# Patient Record
Sex: Female | Born: 1935 | Race: Black or African American | Hispanic: No | Smoking: Never smoker
Health system: Southern US, Community
[De-identification: ages and names within clinical notes are randomized; demographics above are authoritative.]

## PROBLEM LIST (undated history)

## (undated) ENCOUNTER — Emergency Department (HOSPITAL_COMMUNITY): Disposition: A | Discharge status: Home / Self Care | Payer: Medicare PPO

## (undated) DIAGNOSIS — I4892 Unspecified atrial flutter: Secondary | ICD-10-CM

## (undated) DIAGNOSIS — M1990 Unspecified osteoarthritis, unspecified site: Secondary | ICD-10-CM

## (undated) DIAGNOSIS — T8859XA Other complications of anesthesia, initial encounter: Secondary | ICD-10-CM

## (undated) DIAGNOSIS — T4145XA Adverse effect of unspecified anesthetic, initial encounter: Secondary | ICD-10-CM

## (undated) DIAGNOSIS — N189 Chronic kidney disease, unspecified: Secondary | ICD-10-CM

## (undated) DIAGNOSIS — Z5189 Encounter for other specified aftercare: Secondary | ICD-10-CM

## (undated) DIAGNOSIS — I10 Essential (primary) hypertension: Secondary | ICD-10-CM

## (undated) DIAGNOSIS — I2510 Atherosclerotic heart disease of native coronary artery without angina pectoris: Secondary | ICD-10-CM

## (undated) DIAGNOSIS — M069 Rheumatoid arthritis, unspecified: Secondary | ICD-10-CM

## (undated) DIAGNOSIS — K922 Gastrointestinal hemorrhage, unspecified: Secondary | ICD-10-CM

## (undated) DIAGNOSIS — K5790 Diverticulosis of intestine, part unspecified, without perforation or abscess without bleeding: Secondary | ICD-10-CM

## (undated) DIAGNOSIS — I209 Angina pectoris, unspecified: Secondary | ICD-10-CM

## (undated) DIAGNOSIS — R0602 Shortness of breath: Secondary | ICD-10-CM

## (undated) DIAGNOSIS — R413 Other amnesia: Secondary | ICD-10-CM

## (undated) DIAGNOSIS — N200 Calculus of kidney: Secondary | ICD-10-CM

## (undated) DIAGNOSIS — D649 Anemia, unspecified: Secondary | ICD-10-CM

## (undated) DIAGNOSIS — Z9862 Peripheral vascular angioplasty status: Secondary | ICD-10-CM

## (undated) DIAGNOSIS — G4730 Sleep apnea, unspecified: Secondary | ICD-10-CM

## (undated) HISTORY — PX: KNEE ARTHROPLASTY: SHX992

## (undated) HISTORY — PX: ABDOMINAL HYSTERECTOMY: SHX81

## (undated) HISTORY — PX: CORONARY ANGIOPLASTY: SHX604

## (undated) HISTORY — PX: EYE SURGERY: SHX253

## (undated) HISTORY — DX: Unspecified atrial flutter: I48.92

## (undated) HISTORY — PX: HIP ARTHROPLASTY: SHX981

---

## 1998-08-28 ENCOUNTER — Ambulatory Visit (HOSPITAL_COMMUNITY): Admission: RE | Admit: 1998-08-28 | Discharge: 1998-08-28 | Payer: Self-pay | Admitting: Internal Medicine

## 2000-02-20 ENCOUNTER — Encounter: Admission: RE | Admit: 2000-02-20 | Discharge: 2000-05-20 | Payer: Self-pay | Admitting: Orthopedic Surgery

## 2000-04-20 ENCOUNTER — Encounter: Payer: Self-pay | Admitting: Internal Medicine

## 2000-04-20 ENCOUNTER — Encounter: Admission: RE | Admit: 2000-04-20 | Discharge: 2000-04-20 | Payer: Self-pay | Admitting: Internal Medicine

## 2000-07-05 ENCOUNTER — Ambulatory Visit (HOSPITAL_COMMUNITY): Admission: RE | Admit: 2000-07-05 | Discharge: 2000-07-05 | Payer: Self-pay | Admitting: Family Medicine

## 2000-07-05 ENCOUNTER — Encounter: Payer: Self-pay | Admitting: Family Medicine

## 2001-06-14 ENCOUNTER — Encounter: Admission: RE | Admit: 2001-06-14 | Discharge: 2001-06-14 | Payer: Self-pay | Admitting: Family Medicine

## 2001-06-14 ENCOUNTER — Encounter: Payer: Self-pay | Admitting: Family Medicine

## 2001-11-28 ENCOUNTER — Encounter: Payer: Self-pay | Admitting: Orthopedic Surgery

## 2001-12-04 ENCOUNTER — Inpatient Hospital Stay (HOSPITAL_COMMUNITY): Admission: RE | Admit: 2001-12-04 | Discharge: 2001-12-07 | Payer: Self-pay | Admitting: Orthopedic Surgery

## 2001-12-04 ENCOUNTER — Encounter: Payer: Self-pay | Admitting: Orthopedic Surgery

## 2001-12-07 ENCOUNTER — Inpatient Hospital Stay (HOSPITAL_COMMUNITY)
Admission: RE | Admit: 2001-12-07 | Discharge: 2001-12-12 | Payer: Self-pay | Admitting: Physical Medicine & Rehabilitation

## 2002-03-04 ENCOUNTER — Encounter: Admission: RE | Admit: 2002-03-04 | Discharge: 2002-03-04 | Payer: Self-pay | Admitting: Family Medicine

## 2002-03-04 ENCOUNTER — Encounter: Payer: Self-pay | Admitting: Family Medicine

## 2002-08-04 ENCOUNTER — Encounter: Payer: Self-pay | Admitting: Orthopedic Surgery

## 2002-08-04 ENCOUNTER — Encounter: Admission: RE | Admit: 2002-08-04 | Discharge: 2002-08-04 | Payer: Self-pay | Admitting: Orthopedic Surgery

## 2002-11-03 ENCOUNTER — Encounter: Payer: Self-pay | Admitting: Orthopedic Surgery

## 2002-11-05 ENCOUNTER — Inpatient Hospital Stay (HOSPITAL_COMMUNITY): Admission: RE | Admit: 2002-11-05 | Discharge: 2002-11-09 | Payer: Self-pay | Admitting: Cardiology

## 2002-11-05 ENCOUNTER — Encounter: Payer: Self-pay | Admitting: Orthopedic Surgery

## 2002-11-13 ENCOUNTER — Inpatient Hospital Stay (HOSPITAL_COMMUNITY): Admission: AD | Admit: 2002-11-13 | Discharge: 2002-11-18 | Payer: Self-pay | Admitting: Internal Medicine

## 2002-11-14 ENCOUNTER — Encounter (INDEPENDENT_AMBULATORY_CARE_PROVIDER_SITE_OTHER): Payer: Self-pay | Admitting: Cardiology

## 2002-12-23 ENCOUNTER — Encounter: Admission: RE | Admit: 2002-12-23 | Discharge: 2003-03-23 | Payer: Self-pay | Admitting: Internal Medicine

## 2003-06-16 ENCOUNTER — Encounter: Payer: Self-pay | Admitting: Internal Medicine

## 2003-06-16 ENCOUNTER — Encounter: Admission: RE | Admit: 2003-06-16 | Discharge: 2003-06-16 | Payer: Self-pay | Admitting: Internal Medicine

## 2003-10-16 ENCOUNTER — Encounter: Payer: Self-pay | Admitting: Cardiology

## 2003-10-16 ENCOUNTER — Ambulatory Visit (HOSPITAL_COMMUNITY): Admission: RE | Admit: 2003-10-16 | Discharge: 2003-10-16 | Payer: Self-pay | Admitting: Cardiology

## 2003-11-03 ENCOUNTER — Ambulatory Visit (HOSPITAL_COMMUNITY): Admission: RE | Admit: 2003-11-03 | Discharge: 2003-11-03 | Payer: Self-pay | Admitting: Cardiology

## 2003-11-22 ENCOUNTER — Encounter: Admission: RE | Admit: 2003-11-22 | Discharge: 2003-11-22 | Payer: Self-pay | Admitting: Orthopedic Surgery

## 2004-06-09 ENCOUNTER — Ambulatory Visit (HOSPITAL_BASED_OUTPATIENT_CLINIC_OR_DEPARTMENT_OTHER): Admission: RE | Admit: 2004-06-09 | Discharge: 2004-06-09 | Payer: Self-pay | Admitting: Orthopedic Surgery

## 2004-06-09 ENCOUNTER — Ambulatory Visit (HOSPITAL_COMMUNITY): Admission: RE | Admit: 2004-06-09 | Discharge: 2004-06-09 | Payer: Self-pay | Admitting: Orthopedic Surgery

## 2004-07-14 ENCOUNTER — Ambulatory Visit (HOSPITAL_COMMUNITY): Admission: RE | Admit: 2004-07-14 | Discharge: 2004-07-14 | Payer: Self-pay | Admitting: Orthopedic Surgery

## 2004-07-14 ENCOUNTER — Ambulatory Visit (HOSPITAL_BASED_OUTPATIENT_CLINIC_OR_DEPARTMENT_OTHER): Admission: RE | Admit: 2004-07-14 | Discharge: 2004-07-14 | Payer: Self-pay | Admitting: Orthopedic Surgery

## 2005-02-17 ENCOUNTER — Ambulatory Visit (HOSPITAL_BASED_OUTPATIENT_CLINIC_OR_DEPARTMENT_OTHER): Admission: RE | Admit: 2005-02-17 | Discharge: 2005-02-17 | Payer: Self-pay | Admitting: Orthopedic Surgery

## 2005-02-17 ENCOUNTER — Ambulatory Visit (HOSPITAL_COMMUNITY): Admission: RE | Admit: 2005-02-17 | Discharge: 2005-02-17 | Payer: Self-pay | Admitting: Orthopedic Surgery

## 2005-09-05 ENCOUNTER — Ambulatory Visit (HOSPITAL_BASED_OUTPATIENT_CLINIC_OR_DEPARTMENT_OTHER): Admission: RE | Admit: 2005-09-05 | Discharge: 2005-09-05 | Payer: Self-pay | Admitting: Internal Medicine

## 2005-09-05 ENCOUNTER — Encounter: Payer: Self-pay | Admitting: Internal Medicine

## 2005-09-17 ENCOUNTER — Ambulatory Visit: Payer: Self-pay | Admitting: Internal Medicine

## 2005-11-22 ENCOUNTER — Ambulatory Visit: Payer: Self-pay | Admitting: Internal Medicine

## 2005-12-27 ENCOUNTER — Ambulatory Visit: Payer: Self-pay | Admitting: Internal Medicine

## 2006-07-27 ENCOUNTER — Encounter: Admission: RE | Admit: 2006-07-27 | Discharge: 2006-07-27 | Payer: Self-pay | Admitting: Internal Medicine

## 2007-04-16 ENCOUNTER — Ambulatory Visit (HOSPITAL_COMMUNITY): Admission: RE | Admit: 2007-04-16 | Discharge: 2007-04-16 | Payer: Self-pay | Admitting: Internal Medicine

## 2007-04-22 ENCOUNTER — Inpatient Hospital Stay (HOSPITAL_COMMUNITY): Admission: RE | Admit: 2007-04-22 | Discharge: 2007-04-25 | Payer: Self-pay | Admitting: Orthopedic Surgery

## 2007-08-07 ENCOUNTER — Emergency Department (HOSPITAL_COMMUNITY): Admission: EM | Admit: 2007-08-07 | Discharge: 2007-08-07 | Payer: Self-pay | Admitting: *Deleted

## 2008-08-25 ENCOUNTER — Encounter: Admission: RE | Admit: 2008-08-25 | Discharge: 2008-08-25 | Payer: Self-pay | Admitting: Internal Medicine

## 2010-01-17 ENCOUNTER — Ambulatory Visit (HOSPITAL_COMMUNITY): Admission: RE | Admit: 2010-01-17 | Discharge: 2010-01-17 | Payer: Self-pay | Admitting: Internal Medicine

## 2010-02-15 ENCOUNTER — Ambulatory Visit: Payer: Self-pay | Admitting: Internal Medicine

## 2010-02-15 DIAGNOSIS — E785 Hyperlipidemia, unspecified: Secondary | ICD-10-CM

## 2010-02-15 DIAGNOSIS — I10 Essential (primary) hypertension: Secondary | ICD-10-CM | POA: Insufficient documentation

## 2010-02-15 DIAGNOSIS — J328 Other chronic sinusitis: Secondary | ICD-10-CM

## 2010-02-15 DIAGNOSIS — J45909 Unspecified asthma, uncomplicated: Secondary | ICD-10-CM | POA: Insufficient documentation

## 2010-02-15 DIAGNOSIS — G4733 Obstructive sleep apnea (adult) (pediatric): Secondary | ICD-10-CM | POA: Insufficient documentation

## 2010-02-15 DIAGNOSIS — E119 Type 2 diabetes mellitus without complications: Secondary | ICD-10-CM | POA: Insufficient documentation

## 2010-02-16 ENCOUNTER — Telehealth (INDEPENDENT_AMBULATORY_CARE_PROVIDER_SITE_OTHER): Payer: Self-pay | Admitting: *Deleted

## 2010-02-21 DIAGNOSIS — J309 Allergic rhinitis, unspecified: Secondary | ICD-10-CM | POA: Insufficient documentation

## 2010-04-18 ENCOUNTER — Ambulatory Visit: Payer: Self-pay | Admitting: Internal Medicine

## 2010-05-16 ENCOUNTER — Telehealth: Payer: Self-pay | Admitting: Internal Medicine

## 2010-05-29 ENCOUNTER — Encounter: Payer: Self-pay | Admitting: Internal Medicine

## 2010-06-16 ENCOUNTER — Telehealth (INDEPENDENT_AMBULATORY_CARE_PROVIDER_SITE_OTHER): Payer: Self-pay | Admitting: *Deleted

## 2010-06-17 ENCOUNTER — Ambulatory Visit: Admission: RE | Admit: 2010-06-17 | Discharge: 2010-06-17 | Payer: Self-pay | Admitting: Internal Medicine

## 2010-06-17 ENCOUNTER — Ambulatory Visit: Payer: Self-pay | Admitting: Surgery

## 2010-06-17 ENCOUNTER — Encounter: Payer: Self-pay | Admitting: Internal Medicine

## 2010-08-16 ENCOUNTER — Ambulatory Visit: Payer: Self-pay | Admitting: Internal Medicine

## 2011-01-24 NOTE — Letter (Signed)
Summary: CMN for CPAP Supplies/[facility]  CMN for CPAP Supplies/[facility]   Imported By: Sherian Rein 06/02/2010 11:29:22  _____________________________________________________________________  External Attachment:    Type:   Image     Comment:   External Document

## 2011-01-24 NOTE — Progress Notes (Signed)
Summary: ? cpap  Phone Note Call from Patient Call back at Home Phone [phone]   Caller: Patient Call For: Kara Melching Reason for Call: Talk to Nurse Summary of Call: Has questions about her cpap. Initial call taken by: Lehman Prom,  May 16, 2010 12:47 PM  Follow-up for Phone Call        pt found her old cpap machine and wanted to know [facility] number to call them so she could have it cleaned and checked to make sure it was still working properly. I provided her with [facility] number.  She cannot get a new machine until november. pt aware. Carron Curie CMA  May 16, 2010 1:28 PM

## 2011-01-24 NOTE — Progress Notes (Signed)
Summary: cpap  Phone Note Call from Patient   Caller: jennifer w/ [facility] Call For: young Summary of Call: per jennifer: pt is not eligible for new cpap until 11/29/ 11. [phone] Initial call taken by: Tivis Ringer, CNA,  February 16, 2010 4:46 PM  Follow-up for Phone Call        spoke to pt she is aware that she can net get new cpap until 11/11 and [facility] will try to get her a loaner also advised to see if she can find her cpap  Follow-up by: Oneita Jolly,  February 16, 2010 5:13 PM

## 2011-01-24 NOTE — Assessment & Plan Note (Signed)
Summary: 4 months/apc   Primary Provider/Referring Provider:  Willey Blade  CC:  4 month follow up visit-sleep.Marland Kitchen  History of Present Illness:  History of Present Illness: February 15, 2010- 73 yoF referred courtesy of Dr August Saucer for obstructive sleep apnea. Now needing surgical clearance before shoulder surgery by Dr Teressa Senter. NPSG 09/05/05- Moderate OSA, AHI 24.2/hr. CPAP was titrated to 7 cwp with good compliance and control when last seen in 2007. She lost machine at the time of a move a year ago. Bedtime 10-11PM, short sleep latency, up for bathroom 2-3 times, up around 8AM. Weight is up 15 lbs. Denies daytime somnolence and snoring- lives alone. Hx of sinusitis and epistaxis., using Neti pot but no ENT surgery. Has seen Dr. Suszanne Conners. Hx of asthma with occasional wheeze. Some seasonal rhinitis and sensitive to odors. House has mold, no pets. Central AC, no carpet, no basement.  April 18, 2010- OSA, hx asthma Not using CPAP- lost her machine into a box while moving, so she doesn't want to buy another. She isn't due for medicare to pay for a new one til November. Has not gone through unopened boxes looking for the missing machine apparently. Denies asthma flare up in recent months. She needed some reminding about having a rescue inhaler,  but hasn't used one in a long time.  She will be needing shoulder surgery with Dr Teressa Senter. Denies recent active pro blems, including, fever, palpitation, chest pain, blood, purulent sputum, wheeze,  >>>>>>>>>>>>>>>>>>>>>>>>>>>>>>. Will need updated PFT at future visit  August 16, 2010- OSA, hx asthma She did find the missing CPAP machine lost during her move. She is using it and says she no longer wakes herself snoring. CPAP is confining and the tubing bothers her with her hx of rheumatoid arthritis and knee replacements. Pressure is at 7. Discussed bringing tubing up over head of bed.  Asthma- uses Symbicort and Singulair, never feeling need for rescue inhaler.  Occasional spell of cough. Occasionally coughs aftrer eating or early morning with thick stringy mucus. Rhintis- Off nasonex.    Asthma History    Initial Asthma Severity Rating:    Age range: 12+ years    Symptoms: 0-2 days/week    Nighttime Awakenings: 0-2/month    Interferes w/ normal activity: no limitations    SABA use (not for EIB): 0-2 days/week    Asthma Severity Assessment: Intermittent   Preventive Screening-Counseling & Management  Alcohol-Tobacco     Smoking Status: never  Current Medications (verified): 1)  Singulair 10 Mg Tabs (Montelukast Sodium) .... Once Daily 2)  Aricept 10 Mg Tabs (Donepezil Hcl) .... At Bedtime 3)  Dilt-Xr 240 Mg Xr24h-Cap (Diltiazem Hcl) .... Once Daily 4)  Travatan Z 0.004 % Soln (Travoprost) .Marland Kitchen.. 1 Gtt Each Eye At Bedtime 5)  Januvia 100 Mg Tabs (Sitagliptin Phosphate) .... At Bedtime 6)  Humalog 100 Unit/ml Soln (Insulin Lispro (Human)) .... Ssi 7)  Symbicort 160-4.5 Mcg/act Aero (Budesonide-Formoterol Fumarate) .... 2 Puffs Two Times A Day 8)  Micardis 80 Mg Tabs (Telmisartan) .... Once Daily  Allergies (verified): 1)  Codeine  Past History:  Past Surgical History: Last updated: 02/15/2010 hysterectomy 2 hip replacements left knee replacement myringotomy tube   Family History: Last updated: 02/15/2010 heart disease-mother, father  Social History: Last updated: 02/15/2010 Patient never smoked.  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  The patient denies shortness of breath at rest,  non-productive cough, coughing up blood, chest pain, irregular heartbeats, acid heartburn, indigestion, loss of appetite, weight change, abdominal pain, difficulty swallowing, sore throat, tooth/dental problems, headaches, nasal congestion/difficulty breathing through nose, and sneezing.    Vital Signs:  Patient profile:   75 year old female Height:      62 inches Weight:      166 pounds BMI:     30.47 O2 Sat:      98 % on Room air Pulse rate:   87 / minute BP sitting:   144 / 78  (left arm) Cuff size:   regular  Vitals Entered By: Reynaldo Minium CMA (August 16, 2010 11:28 AM)  O2 Flow:  Room air CC: 4 month follow up visit-sleep.   Physical Exam  Mouth:  postnasal drip.   Additional Exam:  General: A/Ox3; pleasant and cooperative, NAD, overweight SKIN: no rash, lesions NODES: no lymphadenopathy HEENT: [place]/AT, EOM- WNL, Conjuctivae- clear, PERRLA, TM-tube right, Nose- audibly stuffy without polyps, Throat- clear and wnl, Mallampati  III NECK: Supple w/ fair ROM, JVD- none, normal carotid impulses w/o bruits Thyroid- normal to palpation CHEST: Clear to P&A HEART: RRR, no m/g/r heard ABDOMEN: Soft and nl;  ACZ:YSAY, nl pulses, no edema , cane NEURO: Grossly intact to observation      Impression & Recommendations:  Problem # 1:  OBSTRUCTIVE SLEEP APNEA (ICD-327.23)  Discussed CPAP comfort and  compliance. Other options may be necessary. I offered consultation with the sleep center staff for CPAP desensitization. She will start by bringing hose up over head of  bed, may need longer hose. Compliance and control seem acceptable.  Problem # 2:  ASTHMA (ICD-493.90) Good control but with a resiudal mucus complaint. Doesn't seem to need rescue inhaler, but mucinex may help.  Other Orders: Est. Patient Level III (30160)  Patient Instructions: 1)  Please schedule a follow-up appointment in 6 months. 2)  Try bringing the cpap hose up over the head of your bed so you have more  flexibility to roll around. [facility] may need to get you a longer hose- can ask them. 3)  Try using otc Mucinex to thin your mucus.

## 2011-01-24 NOTE — Assessment & Plan Note (Signed)
Summary: 2 months/ mbw   Primary Provider/Referring Provider:  Willey Blade  CC:  2 month follow up visit-sleep.  History of Present Illness: February 15, 2010- 75 yoF referred courtesy of Dr August Saucer for obstructive sleep apnea. Now needing surgical clearance before shoulder surgery by Dr Teressa Senter. NPSG 09/05/05- Moderate OSA, AHI 24.2/hr. CPAP was titrated to 7 cwp with good compliance and control when last seen in 2007. She lost machine at the time of a move a year ago. Bedtime 10-11PM, short sleep latency, up for bathroom 2-3 times, up around 8AM. Weight is up 15 lbs. Denies daytime somnolence and snoring- lives alone. Hx of sinusitis and epistaxis., using Neti pot but no ENT surgery. Has seen Dr. Suszanne Conners. Hx of asthma with occasional wheeze. Some seasonal rhinitis and sensitive to odors. House has mold, no pets. Central AC, no carpet, no basement.  April 18, 2010- OSA, hx asthma Not using CPAP- lost her machine into a box while moving, so she doesn't want to buy another. She isn't due for medicare to pay for a new one til November. Has not gone through unopened boxes looking for the missing machine apparently. Denies asthma flare up in recent months. She needed some reminding about having a rescue inhaler,  but hasn't used one in a long time.  She will be needing shoulder surgery with Dr Teressa Senter. Denies recent active pro blems, including, fever, palpitation, chest pain, blood, purulent sputum, wheeze,  >>>>>>>>>>>>>>>>>>>>>>>>>>>>>>. Will need updated PFT at future visit   Current Medications (verified): 1)  Singulair 10 Mg Tabs (Montelukast Sodium) .... Once Daily 2)  Aricept 10 Mg Tabs (Donepezil Hcl) .... At Bedtime 3)  Dilt-Xr 240 Mg Xr24h-Cap (Diltiazem Hcl) .... Once Daily 4)  Travatan Z 0.004 % Soln (Travoprost) .Marland Kitchen.. 1 Gtt Each Eye At Bedtime 5)  Januvia 100 Mg Tabs (Sitagliptin Phosphate) .... At Bedtime 6)  Humalog 100 Unit/ml Soln (Insulin Lispro (Human)) .... Ssi 7)  Symbicort 160-4.5  Mcg/act Aero (Budesonide-Formoterol Fumarate) .... 2 Puffs Two Times A Day 8)  Nasonex 50 Mcg/act Susp (Mometasone Furoate) .Marland Kitchen.. 1 Spray Each Nostril Once Daily 9)  Micardis 80 Mg Tabs (Telmisartan) .... Once Daily  Allergies (verified): 1)  Codeine  Past History:  Past Medical History: Last updated: 02/15/2010 OBSTRUCTIVE SLEEP APNEA (ICD-327.23) -NPSG 09/05/05: AHI 24.2/hr HYPERLIPIDEMIA (ICD-272.4) DIABETES MELLITUS, TYPE II (ICD-250.00) ASTHMA (ICD-493.90) HYPERTENSION (ICD-401.9) Allergic Rhinitis  Past Surgical History: Last updated: 02/15/2010 hysterectomy 2 hip replacements left knee replacement myringotomy tube   Family History: Last updated: 02/15/2010 heart disease-mother, father  Social History: Last updated: 02/15/2010 Patient never smoked.  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    Vital Signs:  Patient profile:   75 year old female Height:      62 inches Weight:      179.13 pounds BMI:     32.88 O2 Sat:      94 % on Room air Pulse rate:   102 / minute BP sitting:   132 / 72  (right arm) Cuff size:   regular  Vitals Entered By: Reynaldo Minium CMA (April 18, 2010 2:22 PM)  O2 Flow:  Room air  Physical Exam  Additional Exam:  General: A/Ox3; pleasant  and cooperative, NAD, overweight SKIN: no rash, lesions NODES: no lymphadenopathy HEENT: /AT, EOM- WNL, Conjuctivae- clear, PERRLA, TM-tube right, Nose- audibly stuffy without polyps. Visible blood and crust left side of septum- again seen, Throat- clear and wnl, Mallampati  III NECK: Supple w/ fair ROM, JVD- none, normal carotid impulses w/o bruits Thyroid-  normal to palpation CHEST: Clear to P&A HEART: RRR, no m/g/r heard ABDOMEN: Soft and nl;  NWG:NFAO, nl pulses, no edema  NEURO: Grossly intact to observation      Impression & Recommendations:  Problem # 1:  ALLERGIC RHINITIS (ICD-477.9)  She still has an area of some bloody crusting on left side of nasal septum. She may be doing something to keep it inflammed, and admits she has been squifrting her nasal steroid directly in that direction.. We discussed technique. I will have her try neosporin. If it persists she should see ENT. I don't see uleration or perforation. She thinks her arthritis is osteo- not something with a vasculitis that would cause septal perforation. Her updated medication list for this problem includes:    Nasonex 50 Mcg/act Susp (Mometasone furoate) .Marland Kitchen... 1 spray each nostril once daily  Problem # 2:  OBSTRUCTIVE SLEEP APNEA (ICD-327.23) I have talked with her about the medical issues of sleep apnea. If she can't find the old machine, she should talk with her DME now, not wait til Novenmber.  Problem # 3:  ASTHMA (ICD-493.90) Control has been good, not needing a rescue inhaler. I don't see particular problems with asthma from a preop evaluation standbpoint.  Other Orders: Est. Patient Level III (13086)  Patient Instructions: 1)  Please schedule a follow-up appointment in 4 months. 2)  When you use yopur Nasonex, point it toward the eye on each side. That will keep the spray from irritiating the same spot in your nose all the time. 3)  try using some neosprin on a q tip twice daily, to help heal the bloody crusitng 4)  If you can't find your old CPAP machine, then please talk with the home care company about getting a replacement. I wouldn't wait til November.

## 2011-01-24 NOTE — Progress Notes (Signed)
Summary: ASAP records  Phone Note From Other Clinic Call back at [phone]   Caller: Dr.wainer's Office - Olegario Messier Call For: young Summary of Call: Please fax last office notes (from 04/18/2010) and surgical clearance letter to this fax# [phone] Attn: Olegario Messier  - Need ASAP! Initial call taken by: Eugene Gavia,  June 16, 2010 8:36 AM  Follow-up for Phone Call        Faxed records.//Juanita    Follow-up by: Darletta Moll,  June 16, 2010 10:20 AM

## 2011-01-24 NOTE — Assessment & Plan Note (Signed)
Summary: sleep cons/surgical clearance/former pt/apc   Primary Provider/Referring Provider:  Willey [name]  CC:  Sleep Consult for left shoulder surgery clearance by Stacey. Teressa [name].  .  History of Present Illness: February 15, 2010- 73 yoF referred courtesy of Stacey [name] for obstructive sleep apnea. Now needing surgical clearance before shoulder surgery by Stacey [name]. NPSG 09/05/05- Moderate OSA, AHI 24.2/hr. CPAP was titrated to 7 cwp with good compliance and control when last seen in 2007. She lost machine at the time of a move a year ago. Bedtime 10-11PM, short sleep latency, up for bathroom 2-3 times, up around 8AM. Weight is up 15 lbs. Denies dayutime somnolence and snoring- lives alone. Hx of sinusitis and epistaxis., using Neti pot but no ENT surgery. Has seen Stacey. Suszanne [name]. Hx of asthma with occasional wheeze. Some seasonal rhinitis and sensitive to odors. House has mold, no pets. Central AC, no carpet, no basement.    Preventive Screening-Counseling & Management  Alcohol-Tobacco     Smoking Status: never  Current Medications (verified): 1)  Singulair 10 Mg Tabs (Montelukast Sodium) .... Once Daily 2)  Aricept 10 Mg Tabs (Donepezil Hcl) .... At Bedtime 3)  Dilt-Xr 240 Mg Xr24h-Cap (Diltiazem Hcl) .... Once Daily 4)  Travatan Z 0.004 % Soln (Travoprost) .Marland Kitchen.. 1 Gtt Each Eye At Bedtime 5)  Januvia 100 Mg Tabs (Sitagliptin Phosphate) .... At Bedtime 6)  Humalog 100 Unit/ml Soln (Insulin Lispro (Human)) .... Ssi 7)  Symbicort 160-4.5 Mcg/act Aero (Budesonide-Formoterol Fumarate) .... 2 Puffs Two Times A Day 8)  Nasonex 50 Mcg/act Susp (Mometasone Furoate) .Marland Kitchen.. 1 Spray Each Nostril Once Daily 9)  Micardis 80 Mg Tabs (Telmisartan) .... Once Daily  Allergies (verified): 1)  Codeine  Past History:  Past Medical History: OBSTRUCTIVE SLEEP APNEA (ICD-327.23) -NPSG 09/05/05: AHI 24.2/hr HYPERLIPIDEMIA (ICD-272.4) DIABETES MELLITUS, TYPE II (ICD-250.00) ASTHMA (ICD-493.90) HYPERTENSION  (ICD-401.9) Allergic Rhinitis  Past Surgical History: hysterectomy 2 hip replacements left knee replacement myringotomy tube   Family History: heart disease-mother, father  Social History: Patient never smoked.  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  The patient denies shortness of breath at rest, productive cough, coughing up blood, chest pain, irregular heartbeats, acid heartburn, indigestion, loss of appetite, weight change, abdominal pain, difficulty swallowing, sore throat, tooth/dental problems, headaches, sneezing, itching, ear ache, anxiety, depression, hand/feet swelling, rash, change in color of mucus, and fever.    Vital Signs:  Patient profile:   75 year old female Height:      62 inches Weight:      188.50 pounds BMI:     34.60 O2 Sat:      97 % on Room air Pulse rate:   83 / minute BP sitting:   140 / 86  (right arm) Cuff size:   large  Vitals Entered By: [name] (February 15, 2010 2:15 PM)  O2 Flow:  Room air CC: Sleep Consult for left shoulder surgery clearance by Stacey. Teressa [name].   Comments Medications reviewed with patient Daytime contact number verified with patient. [name]  February 15, 2010 2:16 PM    Physical Exam  Additional Exam:  General: A/Ox3; pleasant and cooperative, NAD, overweight SKIN: no rash, lesions NODES: no lymphadenopathy HEENT: [name]/AT, EOM- WNL, Conjuctivae- clear,  PERRLA, TM-tube right, Nose- audibly stuffy without polyps. Visible blood and crust left, Throat- clear and wnl, [place]  III NECK: Supple w/ fair ROM, JVD- none, normal carotid impulses w/o bruits Thyroid- normal to palpation CHEST: Clear to P&A HEART: RRR, no m/g/r heard ABDOMEN: Soft and nl;  ZOX:WRUE, nl  pulses, no edema  NEURO: Grossly intact to observation      Impression & Recommendations:  Problem # 1:  OBSTRUCTIVE SLEEP APNEA (ICD-327.23)  She doesn't admit active symptoms now, but says she was using CPAP regularly until she lost machine moving a year ago. Since then she has gained weight and is unlikely to have gotten better.  Pertinent to surgery clearance, the issue is whether we can autotitrate her for pressure check and new machine, or need new study. We will try to find old record..........Marland Kitchen Done. Old study reviewed and scanned.  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 I will let her try Omnaris, but she will need to f/u with [name].   Problem # 3:  ASTHMA (ICD-493.90) Gives hx asthma, but not clinically evident now. She denies hx of heart failure, pneumonia or DVT/PE  Medications Added to Medication List This Visit: 1)  Singulair 10 Mg Tabs (Montelukast sodium) .... Once daily 2)  Aricept 10 Mg Tabs (Donepezil hcl) .... At bedtime 3)  Dilt-xr 240 Mg Xr24h-cap (Diltiazem hcl) .... Once daily 4)  Travatan Z 0.004 % Soln (Travoprost) .Marland Kitchen.. 1 gtt each eye at bedtime 5)  Januvia 100 Mg Tabs (Sitagliptin phosphate) .... At bedtime 6)  Humalog 100 Unit/ml Soln (Insulin lispro (human)) .... Ssi 7)  Symbicort 160-4.5 Mcg/act Aero (Budesonide-formoterol fumarate) .... 2 puffs two times a day 8)  Nasonex 50 Mcg/act Susp (Mometasone furoate) .Marland Kitchen.. 1 spray each nostril once daily 9)  Micardis 80 Mg Tabs (Telmisartan) .... Once daily  Patient Instructions: 1)  Please schedule a follow-up appointment in 2 months. 2)  See [facility] 3)  Try sample Omnaris nasal spray- 2 puffs each nostril every night at bedtime. 4)  You are clear to begin planning necessary  surgery with Stacey [name] 5)  Consider dust and environmental precautions I gave. Especially consider a dehumidifer - hardware stores have them- to cut down on dampness that encourages mold.   Immunization History:  Influenza Immunization History:    Influenza:  historical (09/24/2009)

## 2011-01-24 NOTE — Medication Information (Signed)
Summary: Nocturnal Polysomnogram/[facility]  Nocturnal Polysomnogram/[facility]   Imported By: Sherian Rein 02/25/2010 10:34:15  _____________________________________________________________________  External Attachment:    Type:   Image     Comment:   External Document

## 2011-02-01 ENCOUNTER — Other Ambulatory Visit (INDEPENDENT_AMBULATORY_CARE_PROVIDER_SITE_OTHER): Payer: Self-pay | Admitting: Otolaryngology

## 2011-02-01 DIAGNOSIS — T17998A Other foreign object in respiratory tract, part unspecified causing other injury, initial encounter: Secondary | ICD-10-CM

## 2011-02-07 ENCOUNTER — Ambulatory Visit (HOSPITAL_COMMUNITY)
Admission: RE | Admit: 2011-02-07 | Discharge: 2011-02-07 | Disposition: A | Discharge status: Home / Self Care | Payer: Medicare Other | Source: Ambulatory Visit | Attending: Otolaryngology | Admitting: Otolaryngology

## 2011-02-07 DIAGNOSIS — T17998A Other foreign object in respiratory tract, part unspecified causing other injury, initial encounter: Secondary | ICD-10-CM

## 2011-02-07 DIAGNOSIS — R1310 Dysphagia, unspecified: Secondary | ICD-10-CM | POA: Insufficient documentation

## 2011-02-14 ENCOUNTER — Encounter: Payer: Self-pay | Admitting: Internal Medicine

## 2011-02-14 ENCOUNTER — Ambulatory Visit (INDEPENDENT_AMBULATORY_CARE_PROVIDER_SITE_OTHER): Payer: Medicare Other | Admitting: Internal Medicine

## 2011-02-14 DIAGNOSIS — J309 Allergic rhinitis, unspecified: Secondary | ICD-10-CM

## 2011-02-14 DIAGNOSIS — J45909 Unspecified asthma, uncomplicated: Secondary | ICD-10-CM

## 2011-02-14 DIAGNOSIS — G4733 Obstructive sleep apnea (adult) (pediatric): Secondary | ICD-10-CM

## 2011-02-21 ENCOUNTER — Encounter (INDEPENDENT_AMBULATORY_CARE_PROVIDER_SITE_OTHER): Payer: Medicare Other

## 2011-02-21 ENCOUNTER — Encounter: Payer: Self-pay | Admitting: Internal Medicine

## 2011-02-21 DIAGNOSIS — J45909 Unspecified asthma, uncomplicated: Secondary | ICD-10-CM

## 2011-02-21 NOTE — Assessment & Plan Note (Signed)
Summary: 6 month return/mhh   Primary Provider/Referring Provider:  Willey Blade  CC:  6 month follow up visit-OSA and asthma..  History of Present Illness:  April 18, 2010- OSA, hx asthma Not using CPAP- lost her machine into a box while moving, so she doesn't want to buy another. She isn't due for medicare to pay for a new one til November. Has not gone through unopened boxes looking for the missing machine apparently. Denies asthma flare up in recent months. She needed some reminding about having a rescue inhaler,  but hasn't used one in a long time.  She will be needing shoulder surgery with Dr Teressa Senter. Denies recent active problems, including, fever, palpitation, chest pain, blood, purulent sputum, wheeze,  >>>>>>>>>>>>>>>>>>>>>>>>>>>>>>. Will need updated PFT at future visit  August 16, 2010- OSA, hx asthma She did find the missing CPAP machine lost during her move. She is using it and says she no longer wakes herself snoring. CPAP is confining and the tubing bothers her with her hx of rheumatoid arthritis and knee replacements. Pressure is at 7. Discussed bringing tubing up over head of bed.  Asthma- uses Symbicort and Singulair, never feeling need for rescue inhaler. Occasional spell of cough. Occasionally coughs aftrer eating or early morning with thick stringy mucus. Rhintis- Off nasonex.   February 14, 2011- OSA, hx asthma Nurse-CC: 6 month follow up visit-OSA and asthma. Asthma- coughing here- says warm room. Coughs with meals. Just had barium swallow for DrTeoh and understands from tech it was ok. Denies wheeze or chest tightness, phlegm or chest pain. Has burning sensation left nostril.  Needs refill Symbicort- out for a while. Continues Singulair.  CPAP 7- Advanced. Using it every night with humidifer.     Asthma History    Asthma Control Assessment:    Age range: 12+ years    Symptoms: 0-2 days/week    Nighttime Awakenings: 0-2/month    Interferes w/ normal activity: no  limitations    SABA use (not for EIB): 0-2 days/week    Asthma Control Assessment: Well Controlled   Preventive Screening-Counseling & Management  Alcohol-Tobacco     Smoking Status: never  Current Medications (verified): 1)  Singulair 10 Mg Tabs (Montelukast Sodium) .... Once Daily 2)  Aricept 10 Mg Tabs (Donepezil Hcl) .... At Bedtime 3)  Dilt-Xr 240 Mg Xr24h-Cap (Diltiazem Hcl) .... Once Daily 4)  Travatan Z 0.004 % Soln (Travoprost) .Marland Kitchen.. 1 Gtt Each Eye At Bedtime 5)  Januvia 100 Mg Tabs (Sitagliptin Phosphate) .... At Bedtime 6)  Humalog 100 Unit/ml Soln (Insulin Lispro (Human)) .... Ssi 7)  Symbicort 160-4.5 Mcg/act Aero (Budesonide-Formoterol Fumarate) .... 2 Puffs Two Times A Day 8)  Micardis 80 Mg Tabs (Telmisartan) .... Once Daily 9)  Methotrexate Sodium 25 Mg/ml Soln (Methotrexate Sodium) .... Marland Kitchen6ml Weekly  Allergies (verified): 1)  Codeine  Past History:  Past Medical History: Last updated: 08/16/2010 OBSTRUCTIVE SLEEP APNEA (ICD-327.23) -NPSG 09/05/05: AHI 24.2/hr HYPERLIPIDEMIA (ICD-272.4) DIABETES MELLITUS, TYPE II (ICD-250.00) ASTHMA (ICD-493.90) HYPERTENSION (ICD-401.9) Allergic Rhinitis Rheumatoid Arthritis  Past Surgical History: Last updated: 02/15/2010 hysterectomy 2 hip replacements left knee replacement myringotomy tube   Family History: Last updated: 02/15/2010 heart disease-mother, father  Social History: Last updated: 02/15/2010 Patient never smoked.  no alcohol Retired Runner, broadcasting/film/video divorced 3 sons lives alone  Risk Factors: Smoking Status: never (02/14/2011)  Review of Systems      See HPI       The patient complains of non-productive cough.  The patient denies shortness of  breath with activity, shortness of breath at rest, productive cough, coughing up blood, chest pain, irregular heartbeats, acid heartburn, indigestion, loss of appetite, weight change, abdominal pain, difficulty swallowing, sore throat, tooth/dental problems,  headaches, nasal congestion/difficulty breathing through nose, and sneezing.    Vital Signs:  Patient profile:   75 year old female Height:      62 inches Weight:      169.50 pounds BMI:     31.11 O2 Sat:      99 % on Room air Pulse rate:   92 / minute BP sitting:   128 / 82  (left arm) Cuff size:   regular  Vitals Entered By: Reynaldo Minium CMA (February 14, 2011 1:33 PM)  O2 Flow:  Room air CC: 6 month follow up visit-OSA and asthma.   Physical Exam  Additional Exam:  General: A/Ox3; pleasant and cooperative, NAD, overweight SKIN: no rash, lesions NODES: no lymphadenopathy HEENT: [place]/AT, EOM- WNL, Conjuctivae- clear, PERRLA, TM-tube right, Nose- minor crust left nare, Throat- clear and wnl, Mallampati  III, Voice normal NECK: Supple w/ fair ROM, JVD- none, normal carotid impulses w/o bruits Thyroid- normal to palpation CHEST: Clear to P&A on auscultation. Before I opened door to cool room, she had a steady paroxysmal cough.  HEART: RRR, no m/g/r heard ABDOMEN: Soft and nl;  EAV:WUJW, nl pulses, no edema , cane NEURO: Grossly intact to observation      Impression & Recommendations:  Problem # 1:  OBSTRUCTIVE SLEEP APNEA (ICD-327.23)  Good compliance and control now.   Problem # 2:  ASTHMA (ICD-493.90) I think at least some of the cough is an asthma pattern, sensitive to room temp. Dr Suszanne Conners is looking for aspiration and she will f/u with him as directed. We will get her started back on Symbicort and watch for effect. We will add rescue inhaler. Schedule PFT.  Medications Added to Medication List This Visit: 1)  Methotrexate Sodium 25 Mg/ml Soln (Methotrexate sodium) .... Marland Kitchen6ml weekly 2)  Cpap 7 Advanced  3)  Proair Hfa 108 (90 Base) Mcg/act Aers (Albuterol sulfate) .... 2 puffs four times a day as needed rescue inhaler  Other Orders: Est. Patient Level IV (11914)  Patient Instructions: 1)  Please schedule a follow-up appointment in 6 months. 2)  You can experiment  with Singulair, taking it for a week, skipping it for a week, and repeating that a couple f times till you decide if Singulair helps your breathing enough to be worthwhile.  3)  Refill script Symbicort for every day use 4)  Script Proair rescue inhaler to use as needed as a rescue inhaler for coughing spells. 5)  schedule PFT 6)  cc Dr Suszanne Conners, Dr August Saucer Prescriptions: SYMBICORT 160-4.5 MCG/ACT AERO (BUDESONIDE-FORMOTEROL FUMARATE) 2 puffs two times a day  #1 x prn   Entered and Authorized by:   Waymon Budge MD   Signed by:   Waymon Budge MD on 02/14/2011   Method used:   Print then Give to Patient   RxID:   [id number] PROAIR HFA 108 (90 BASE) MCG/ACT AERS (ALBUTEROL SULFATE) 2 puffs four times a day as needed rescue inhaler  #1 x prn   Entered and Authorized by:   Waymon Budge MD   Signed by:   Waymon Budge MD on 02/14/2011   Method used:   Print then Give to Patient   RxID:   [id number]

## 2011-03-02 NOTE — Assessment & Plan Note (Signed)
Summary: pft charges   Allergies: 1)  Codeine   Other Orders: Carbon Monoxide diffusing w/capacity (16109) Lung Volumes/Gas dilution or washout (60454) Spirometry (Pre & Post) [phone])

## 2011-05-09 NOTE — Letter (Signed)
June 15, 2010    Robert A. Thurston Hole, M.D.  [address] [place]  [address]  [place] [address]  Fax [phone]   RE:  SHUNTE, SENSENEY  MRN:  [id number]  /  DOB:  [birth year]   REFERENCE:  Patient, Bobbijo Holst.   Dear Dr. Thurston Hole,   This is a medical clearance letter in response to your request  anticipating orthopedic surgery for Mrs. Calamia.  My office notes are  enclosed.  She has a history of obstructive sleep apnea, but had dropped  off of CPAP and that is being reestablished.  I would anticipate  autotitration of the CPAP, recommending pressure setting between 5-15  CWP, to be worn during her surgical recovery while sedated by pain meds,  before return home.  Her home care company has been [facility] in [facility], phone number [phone], if your office needs to contact them for  any specifics.  She also has had some history of mild intermittent  asthma for which she had not regularly needed a rescue inhaler.  Based  on my documented visits, I think she will be stable and clear for  necessary surgery and general anesthesia.    Sincerely,      Clinton D. Maple Hudson, MD, Tonny Bollman, FACP  Electronically Signed    CDY/MedQ  DD: 06/15/2010  DT: 06/15/2010  Job #: [id number]

## 2011-05-12 NOTE — Procedures (Signed)
NAMELALLIE, STRAHM               ACCOUNT NO.:  [id number]   MEDICAL RECORD NO.:  [id number]          PATIENT TYPE:  OUT   LOCATION:  SLEEP CENTER                 FACILITY:  [facility]   PHYSICIAN:  Clinton D. Maple Hudson, M.D. DATE OF BIRTH:  May 27, 1936   DATE OF STUDY:  09/05/2005                              NOCTURNAL POLYSOMNOGRAM   REFERRING PHYSICIAN:  Dr. Willey Blade.   DATE OF STUDY:  September 05, 2005.   INDICATION FOR STUDY:  Insomnia with sleep apnea. Epworth sleepiness score  6/24, BMI 34. Weight 189 pounds. In referring notes, it is reported she  complains of daytime drowsiness. Epworth sleepiness score greater than 10  per hour would be considered to indicate abnormal daytime sleepiness.   SLEEP ARCHITECTURE:  Total sleep time 374 minutes with sleep efficiency 86%.  Stage I was 6%, stage II 38%, stages III and IV 39%, REM 17% of total sleep  time. Sleep latency 13 minutes, REM latency 181 minutes, awake after sleep  onset 48 minutes, arousal index 21 per hour. Bedtime medication was  Fortamet.   RESPIRATORY DATA:  Split study protocol. Apnea/hypopnea index (AHI, RDI)  24.2 obstructive events per hour indicating moderate obstructive sleep/slash  hypopnea syndrome before CPAP. This included 7 obstructive apneas and 48  hypopneas before CPAP. She slept almost exclusively on her left side with  most events reported in that position. REM AHI 6.5. CPAP was titrated to 7  CWP, AHI of 1.7 per hour. Mask type ResMed Swift with small nasal pillows  and heated humidifier.   OXYGEN DATA:  Moderate snoring with oxygen desaturation to a nadir of 89%  before CPAP. After CPAP control saturation held 96-98% on room air.   CARDIAC DATA:  Normal sinus rhythm.   MOVEMENT/PARASOMNIA:  Occasional leg jerk with little effect on sleep.   IMPRESSION/RECOMMENDATIONS:  1.  Moderate obstructive sleep apnea/hypopnea syndrome, apnea/hypopnea index      24.2 per hour with moderate snoring and oxygen  desaturation to 89%.  2.  Successful CPAP titration to 7 centimeter of water pressure,      apnea/hypopnea index 1.7 per hour. Mask type ResMed Swift with small      nasal pillows and heated humidifier.      Clinton D. Maple Hudson, M.D.  Diplomate, Biomedical engineer of Sleep Medicine  Electronically Signed     CDY/MEDQ  D:  09/17/2005 08:56:35  T:  09/17/2005 14:23:07  Job:  [id number]

## 2011-05-12 NOTE — H&P (Signed)
[name], [name]                        ACCOUNT NO.:  [id number]   MEDICAL RECORD NO.:  [id number]                    PATIENT TYPE:   LOCATION:  5533                                 FACILITY:   PHYSICIAN:  [name], M.D.                  DATE OF BIRTH:  [birth year]   DATE OF ADMISSION:  11/13/2002  DATE OF DISCHARGE:                                HISTORY & PHYSICAL   CHIEF COMPLAINT:  Presyncope with increasing weakness.   HISTORY OF PRESENT ILLNESS:  This is one of several admissions for this 75-  year-old divorced black female who had recently been admitted for right  total knee replacement.  Just prior to that time, she had been found to have  diabetes mellitus, new onset.  The patient unfortunately, did not received  education regarding the diabetes and/or insulin injection therapy.  At the  time of discharge, she felt extremely weak upon arrival in her home in  [place].  She states she passed out for approximately three hours.  No  tongue biting or associated seizure activity was observed.   Since that time, the patient has had recurrent weakness.  She has not been  eating as well as she has had difficulty getting up due to her recent hip  surgery.  She has not had significant assistance from her surrounding  community and friends.  The patient today, had gone to Dr. Jonetta Osgood office  for followup.  She became extremely weak during that time and was advised to  come to the office for further evaluation.   Appetite has been terrible.  She has been taking her Amaryl and has not been  checking her pressures, however.  She denies actual chest pains,  palpitations.  No significant shortness of breath, diaphoresis.   HABITS:  Patient presently does not smoke or drink.   PAST SURGICAL HISTORY:  Significant for left hip surgery in 12/02, right hip  surgery this month.  She is status post hysterectomy.  She has had  dislocated right shoulder as well.   REVIEW OF SYSTEMS:   As noted above.   ADMISSION MEDICATIONS:  Tiazac 180 mg p.o., every day, Coumadin 7.5 mg p.o.  every day, Maxzide 25 mg p.o. every day, Amaryl 2 mg p.o. every day, Teveten  0.004% one drop every day in each eye.   ALLERGIES:  Allergic to CODEINE, causes nausea and light-headedness.   PHYSICAL EXAMINATION:  GENERAL:  A weak appearing black female in no acute  distress initially.  Height:  5 feet, 2 inches.  Weight 78.7 kg.  VITAL SIGNS:  Blood pressure 104/57, pulse 101, respiratory rate 20,  temperature 97.  CBG was 227.  HEENT:  Head normocephalic, atraumatic without bruise.  Extraocular muscles  intact.  Fundi grade 1.  There is no sinus tenderness.  Mouth no edema.  TMs  are clear.  NECK:  Supple, no posterocervical nodes.  LUNGS:  Clear without wheeze or rales.  No E to A changes.  CARDIOVASCULAR:  Normal S1, S2, no S3, S4, murmurs or rubs.  CHEST:  No chest wall tenderness.  ABDOMEN:  Bowel sounds are present.  No enlarged liver or spleen.  MUSCULOSKELETAL:  Notable for right hip being status post replacement.  There is negative edema bilaterally.  NEUROLOGIC:  Neurologically, intact.   LABORATORY DATA:  CBG was 227 on admission.  Laboratory data, otherwise,  reveals sodium of 136, potassium 3.8, chloride 97, CO2 27, BUN 21,  creatinine 1.4.  CBC revealed WBC of 11,700, hemoglobin 10.7, hematocrit  32.7.  Polys 70, 8.7% granulocytes.  CK 107, troponin 0.01.   IMPRESSIONS:  1. New onset diabetes mellitus, currently uncontrolled.  Patient with recent     presyncopal spells, questionable etiology.  Rule out secondary to     fluctuating blood sugars versus arrhythmia versus other.  2. Diabetes mellitus, new onset.  Patient did not received diabetic     education when previously hospitalized.  3. Status post right total hip replacement.  The patient unfortunately was     not able to ambulate at home.  States she has felt much weaker this time     than compared to her previous  postoperative period for her left hip.   PLAN:  The patient is admitted for telemetry observation.  Will begin  intensive diabetic education, document the status of her sugar for  subsequent control.  She will need orthopedic followup for her right hip  replacement, OT and PT evaluation as well.  Strong consideration should be  given to the patient staying in Short Stay Unit following discharge.  She  may need ongoing assistance.  Therapy pending results of above.                                                   [name], M.D.    ELD/MEDQ  D:  11/13/2002  T:  11/14/2002  Job:  [id number]

## 2011-05-12 NOTE — Discharge Summary (Signed)
[place]  Patient:    KYNSLEE, BAHAM Visit Number: [id number] MRN: [id number]          Service Type: Dictated by:   Mcarthur Rossetti. Angiulli, P.A.                             Discharge Summary  NO DICTATION Dictated by:   Mcarthur Rossetti. Angiulli, P.A. DD:  12/11/01 TD:  12/11/01 Job: [id number]

## 2011-05-12 NOTE — Discharge Summary (Signed)
[name], [name]                         ACCOUNT NO.:  [id number]   MEDICAL RECORD NO.:  [id number]                   PATIENT TYPE:  INP   LOCATION:  3028                                 FACILITY:  [facility]   PHYSICIAN:  Eric L. August Saucer, M.D.                  DATE OF BIRTH:  03-25-1936   DATE OF ADMISSION:  11/13/2002  DATE OF DISCHARGE:  11/18/2002                                 DISCHARGE SUMMARY   FINAL DIAGNOSES:  1. Diabetes mellitus without complications, type 2, non-insulin-dependent,     250.02.  2. Right knee replacement, status post B435.64.  3. Long term use of anticoagulants, B58.61.  4. Hypertension, 401.9.  5. Hypopotassemia, 276.8.   OPERATION/PROCEDURE:  None.   HISTORY OF PRESENT ILLNESS:  This was the second recent most hospital  admission for this 75 year old divorced black female who has recently  undergone a total right knee replacement.  During her hospital stay she was  noted to have diabetes mellitus.  Patient was discharged home however  without adequate diabetic education.  At the time of discharge she was  feeling very weak.  Upon arrival at her home she states that she passed  out for approximately three hours.  Family members are unaware of any  tongue biting or associated seizure activity.  Since that time patient has  had recurrent bouts of weakness.  She has not been eating as well due to not  having adequate support at home.  She was seen the day on admission in Dr.  Sherene Sires office for follow up.  During that time she became extremely weak  and referred back to the medical office and was admitted for further  evaluation.  Notably appetite has been verbal.  She had been taking Amaryl  but has not been checking her sugars.  Patient denies chest pain,  palpitations or shortness of breath.   PAST MEDICAL HISTORY:  Per admission H&P.   PHYSICAL EXAMINATION:  Per admission H&P.   HOSPITAL COURSE:  Patient was admitted for further evaluation and  treatment  of uncontrolled diabetes and recent presyncopal spells.  Question of  possible arrhythmias versus fluctuating blood sugars as far as the  underlying etiology.  She was placed in telemetry for observation for  arrhythmias.  No significant arrhythmias were found.  She was started on IV  hydration.  She underwent intensive diabetic education.  Over the subsequent  days she did make gradual improvement.  She was noted to have low blood  sugars at times.  It was felt that she may have been experiencing  hypoglycemic spells at home.  She did undergo diabetic education with good  understanding.  No significant arrhythmias were found.  Orthostatic blood  pressure checks were adequate as well.  She did have transient hypokalemia  which was corrected.  Patient was seen in follow up by an orthopedist who  recommended to  continue physical therapy.  This was going to be pursued as  an outpatient.  She also was maintained on Coumadin with therapeutic levels.   A urinalysis was obtained which showed a urinary tract infection from a  previous stay.  This was treated as well.  By 11/25, she was feeling  considerably better.  She did have an elevated INR at that time with plans  for adjustment of this further as an outpatient.   DISCHARGE MEDICATIONS:  Medications at the time of discharge consisted of:  1. Tiazac 180 mg p.o. q.d.  2. Amaryl 4 mg q.a.m.  3. Maxzide 25 mg q.d.  4. Coumadin 5 mg q.d.  5. Travatan eyedrops 0.004% one drop each eye q.d.  6. Macrobid 100 mg b.i.d. for three additional days.  7. She will be maintained on Humalog per sliding scale: 151-200, three     units; 201-250, five units; 251-300, six to seven units; 301-350, nine     units; greater than 350, eleven units.  8. She will be maintained on Darvocet N 100 mg q.4h. p.r.n. pain.   DISCHARGE INSTRUCTIONS:  She is to avoid any strenuous activity.  She will  be maintained on 4 grams sodium, 1,600 calorie, ADA diet.   She is to  increase her activity as tolerated.  She will need INR checks every day for  the next three days until stable.  Further adjustment thereafter.  Follow up  in the office otherwise in two weeks time.                                                Eric L. August Saucer, M.D.    ELD/MEDQ  D:  12/17/2002  T:  12/19/2002  Job:  [id number]

## 2011-05-12 NOTE — Op Note (Signed)
[name], [name]                         ACCOUNT NO.:  [id number]   MEDICAL RECORD NO.:  [id number]                   PATIENT TYPE:  AMB   LOCATION:  DSC                                  FACILITY:  [facility]   PHYSICIAN:  Katy Fitch. Naaman Plummer., M.D.          DATE OF BIRTH:  Dec 27, 1935   DATE OF PROCEDURE:  07/14/2004  DATE OF DISCHARGE:                                 OPERATIVE REPORT   PREOPERATIVE DIAGNOSES:  Chronic entrapment neuropathy of median nerve,  right carpal tunnel.   POSTOPERATIVE DIAGNOSES:  Chronic entrapment neuropathy of median nerve,  right carpal tunnel.   OPERATION PERFORMED:  Release of right transverse carpal ligament.   SURGEON:  Katy Fitch. Sypher, M.D.   ASSISTANT:  Jonni Sanger, P.A.   ANESTHESIA:  General laryngeal mask.   SUPERVISING ANESTHESIOLOGIST:  Zenon Mayo, MD   INDICATIONS FOR PROCEDURE:  [name] is a 75 year old woman who has a  background history of diabetes and high blood pressure.  She was referred  for evaluation and management of bilateral hand numbness.  She is status  post release of her left transverse carpal ligament with a very satisfactory  outcome.  She now returns for similar surgery on the right.  After informed  consent, she is brought to the operating room at this time.   DESCRIPTION OF PROCEDURE:  [name] was brought to the operating room  and placed in supine position upon the operating table.  Following induction  of general anesthesia by LMA, the right arm was prepped with Betadine soap  and solution and sterilely draped.  Following exsanguination of the right  arm with an Esmarch bandage, an arterial tourniquet on the proximal brachium  was inflated to 230 mmHg. The procedure commenced with a short incision in  line with the ring finger in the palm.  Subcutaneous tissues are carefully  divided revealing the palmar fascia.  This was split longitudinally to  reveal the common sensory branch of  the median nerve.  [name] was noted  to have rather unusual anatomy with a very prominent volar carpal ligament  that covered the transverse carpal ligament to a distance of the midpalm.  This was released to allow exposure of the superficial palmar arch and the  common sensory branches.  The common sensory branches were followed back to  the margin of the transverse carpal ligament after assuring that the motor  branch was not penetrating the ligament.  The ligament was released on its  ulnar border extending to the distal forearm.  This widely opened the carpal  canal.  No masses or other predicaments were noted.  Bleeding points along  the margin of the released ligament were electrocauterized with bipolar  current followed by repair of the skin with intradermal 3-0 Prolene suture.  A compressive dressing was  applied with a volar plaster splint maintaining the wrist in five degrees  dorsiflexion.  For aftercare [name] has pain medication at home  including Darvocet.  The patient return to our office for follow-up in 7 to  10 days for dressing change and advancement to an exercise program.                                               Katy Fitch. Naaman Plummer., M.D.    RVS/MEDQ  D:  07/14/2004  T:  07/14/2004  Job:  [id number]   cc:   Minerva Areola L. August Saucer, M.D.  [address] [address]  [place]  [place] [address]  Fax: [phone]

## 2011-05-12 NOTE — Op Note (Signed)
Stacey, [name]               ACCOUNT NO.:  [id number]   MEDICAL RECORD NO.:  [id number]          PATIENT TYPE:  AMB   LOCATION:  DSC                          FACILITY:  [facility]   PHYSICIAN:  Katy Fitch. Sypher Montez Hageman., M.D.DATE OF BIRTH:  01/03/1936   DATE OF PROCEDURE:  02/17/2005  DATE OF DISCHARGE:                                 OPERATIVE REPORT   PREOPERATIVE DIAGNOSES:  1.  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  2.  Acute stenosing tenosynovitis, right ring finger at A1 pulley.   POSTOPERATIVE DIAGNOSES:  1.  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  2.  Acute stenosing tenosynovitis, right ring finger at A1 pulley.   OPERATION:  1.  Release of left, ring finger A1 pulley.  2.  Injection of right ring finger flexor sheath with Depo-Medrol and      lidocaine.   OPERATING SURGEON:  Katy Fitch. Sypher, M.D.   ASSISTANT:  Josephina Gip, PA-C.   ANESTHESIA:  General sedation/monitored anesthesia care with 1/4% Marcaine  and 2% lidocaine metacarpal head level block of left ring finger.   SUPERVISING ANESTHESIOLOGIST:  Burna Forts, M.D.   INDICATIONS:  [name] is a 75 year old woman referred by Dr. Willey Blade  for evaluation and management of a locking left ring finger. Clinical  examination revealed signs of bilateral stenosing tenosynovitis of the right  ring finger and left ring finger.   [name] had a background history of diabetes.   Due to failure to respond to nonoperative measures, she is brought to  operating room, at this time, for release of her left ring finger A1 pulley.   PROCEDURE:  [name] is brought to operating room and placed in supine  position on the operating room table.   Following light sedation the left arm was prepped with Betadine soap  solution and sterilely draped. A pneumatic tourniquet was applied to the  proximal forearm.   After exsanguination of the left arm with an  Esmarch bandage the arterial  tourniquet was inflated on the proximal forearm to 260 mmHg due to mild  systolic hypertension.   After infiltration of 1/4% Marcaine and 2% lidocaine, anesthesia was noted  be satisfactory; therefore, a short incision was fashioned directly over the  palpably thickened A1 pulley. The subcutaneous tissue were carefully divided  taking care to identify the neurovascular bundles and the flexor sheath.  There was a cuff of inflammatory tissue obscuring the A1 pulley. This was  parted longitudinally with a scalpel and scissors followed by identification  of A1 pulley. The pulley was released longitudinally along its radial border  to prevent bowstring of the tendon and ulnar drift.   The proximal prepucal fold was identified and found to be moderately  swollen.   After release of the pulley. [name] demonstrated full active range of  motion of her ring finger in flexion and extension. The wound was repaired  with mattress sutures of 5-0 nylon.   A compressive dressing applied with Xeroform, sterile gauze and Ace wrap.  The tourniquet released with immediate  capillary fill the fingers and thumb.   Attention was then directed to [name]'s ring finger. After Betadine  prep, 1% lidocaine and Depo-Medrol was injected into the flexor sheath with  distension. This wound was dressed with a Band-Aid.   There were no apparent complications.   This patient tolerated the procedure well. She was transferred to the  recovery room for observation of her vital signs; and will be discharged  home to the care her family with prescription for Darvocet N 100 one p.o.  for 6 hours p.r.n. pain, 20 tablets without refill. She understands that her  blood glucose will be elevated 1-2 days after a small steroid was injected  into right ring finger flexor sheath.      RVS/MEDQ  D:  02/17/2005  T:  02/17/2005  Job:  [id number]

## 2011-05-12 NOTE — Op Note (Signed)
NAMEKRISLYN, DONNAN               ACCOUNT NO.:  [id number]   MEDICAL RECORD NO.:  [id number]          PATIENT TYPE:  INP   LOCATION:  2899                         FACILITY:  [facility]   PHYSICIAN:  Elana Alm. Thurston Hole, M.D. DATE OF BIRTH:  1936-07-15   DATE OF PROCEDURE:  04/22/2007  DATE OF DISCHARGE:                               OPERATIVE REPORT   PREOPERATIVE DIAGNOSIS:  Left knee degenerative joint disease.   POSTOPERATIVE DIAGNOSIS:  Left knee degenerative joint disease.   PROCEDURE:  Left total knee replacement using DePuy cemented total knee  system with #2.5 cemented femur, #2.5 cemented tibia with 15 mm  polyethylene RP tibial spacer and 32 mm polyethylene cemented patella.   SURGEON:  Dr. Salvatore Marvel   ASSISTANT:  Julien Girt, PA   ANESTHESIA:  General.   OPERATIVE TIME:  1 hour 15 minutes.   COMPLICATIONS:  None.   DESCRIPTION OF PROCEDURE:  Ms. Jeanbaptiste was brought to the operating room  on April 22, 2007 after a femoral nerve block was placed in the holding  room by anesthesia.  She was placed on the operating table in supine  position.  She received Ancef 1 g IV preoperatively for prophylaxis.  After being placed under general anesthesia, a Foley catheter was placed  under sterile conditions.  Her left knee was examined.  Range of motion  from -3 to 125 degrees with mild varus deformity, knee stable,  ligamentous exam with normal patella tracking.  The left leg was prepped  using sterile DuraPrep and draped using sterile technique.  Leg was  exsanguinated and the tourniquet elevated to 365 mm.  She had previously  been placed under general anesthesia without complications.  Initially,  through a 10 cm longitudinal incision based over the patella, initial  exposure was made.  Midline subcutaneous tissues were incised in line  with the skin incision.  A median arthrotomy was performed revealing an  excessive amount of normal-appearing joint fluid.  The  articular  surfaces were inspected.  She had grade 4 changes medially, laterally,  and in the patellofemoral joint.  Osteophytes were removed off the  femoral condyles and tibial plateau.  The medial and lateral meniscal  remnants were removed as well as the anterior cruciate ligament.  An  intramedullary drill was then drilled up the femoral canal for placement  of the distal femoral cutting jig which was placed in the appropriate  amount of rotation, and a distal 11 mm cut was made.  The distal femur  was then incised.  Number 2.5 was found to be the appropriate size.  A  2.5 cutting jig was placed in the appropriate amount of rotation, and  then these cuts were made.  At this point, the proximal tibia was  exposed.  The tibial spines were removed with an oscillating saw.  Intramedullary drill was drilled down the tibial canal for placement of  the proximal tibial cutting jig which was placed in the appropriate  amount of rotation, and a proximal 6 mm cut was made based off the  medial or lower side.  Spacer blocks  were then placed in flexion and  extension.  The 25 mm block gave excellent stability, excellent  balancing, and excellent correction of her flexion and varus  deformities.  At this point, 2.5 tibial base plate tray was placed on  the cut tibial surface, and the keel cut was made.  PCL box cutter was  then placed on the distal femur, and these cuts were made.  At this  point, the #2.5 femoral trial was placed and with the 2.5 tibial  baseplate trial and the 15 mm upon polyethylene spacer, the knee was  reduced, taken through a range of motion from 0-125 degrees with  excellent stability and excellent correction of her flexion and varus  deformities.  The patella was sized.  A resurfacing 9 mm cut was made  and 3 locking holes placed for a 32 mm patella.  The patella trial was  placed, and patellofemoral tracking was evaluated, found to be normal.  At this point, it was felt  that all the components were of excellent  size, fit, and stability.  They were then removed.  The knee was then  jet-lavaged, irrigated with 3 L of saline.  The proximal tibia was then  exposed, and the #2.5 tibial baseplate with cement vacuum was hammered  into its position with excess cement being removed from around the  edges.  The 2.5 femoral component with cement vacuum was hammered into  position, also with an excellent fit with excess cement being removed  from around the edges.  The 15 mm polyethylene RP tibial spacer was  placed on the tibial baseplate, the knee reduced, taken through a range  of motion from 0-125 degrees with excellent stability and excellent  correction of her flexion and varus deformities.  The 32 mm polyethylene  cement-back patella was then placed in its position and held there with  a clamp.  After the cement hardened, patellofemoral tracking was again  evaluated, and this was found to be normal.  At this point, it was felt  that all the components were of excellent size, fit, and stability.  The  wound was further irrigated with saline.  The tourniquet was released,  hemostasis obtained with cautery.  The arthrotomy was then closed with  #1 Ethibond suture over 2 medium Hemovac drains.  Subcutaneous tissue  was closed with 0 and 2-0 Vicryl, subcuticular layer closed with 4-0  Monocryl.  Sterile dressings and a long-leg splint applied, the patient  then awakened, extubated, and taken to the recovery room in stable  condition.  Needle and sponge counts correct x2 at the end of the case.      Robert A. Thurston Hole, M.D.  Electronically Signed     RAW/MEDQ  D:  04/22/2007  T:  04/22/2007  Job:  [id number]

## 2011-05-12 NOTE — Op Note (Signed)
[place]  Patient:    HEIRESS, WILLIAMSON Visit Number: [id number] MRN: [id number]          Service Type: SUR Location: 5000 [id number] [id number] Attending Physician:  Twana First Dictated by:   Elana Alm Thurston Hole, M.D. Proc. Date: 12/04/01 Admit Date:  12/04/2001                             Operative Report  PREOPERATIVE DIAGNOSIS:  Left hip degenerative joint disease.  POSTOPERATIVE DIAGNOSIS:  Left hip degenerative joint disease.  PROCEDURE:  Left total hip replacement using [employer]ics total hip system with acetabulum 50 mm PSL acetabulum with two locking screws and 10 degree polyethylene liner.  Femoral component #6 press-fit Secure-Fit Plus with +5 x 28 mm femoral head with 10 mm distal tip.  SURGEON:  Elana Alm. Thurston Hole, M.D.  ASSISTANT:  Julien Girt, P.A.  ANESTHESIA:  General.  OPERATIVE TIME:  1 hour 30 minutes.  ESTIMATED BLOOD LOSS:  300 cc.  COMPLICATIONS:  None.  DESCRIPTION OF PROCEDURE:  Ms. Bascom was brought to the operating room on December 04, 2001, placed on the operating table in supine position.  After an adequate level of general anesthesia was obtained, her left hip was examined under anesthesia.  She had flexion of 95, extension to 0, internal and external rotation of 30 degrees.  There was approximately 1 cm of leg length shortening on the left compared to the right.  She had a Foley catheter placed under sterile conditions and received Ancef 1 g IV preoperatively for prophylaxis.  She was then turned in the left lateral-up decubitus position and secured on the bed with the Stulberg frame. Her left hip and leg were prepped using sterile Betadine and draped using sterile technique.  Originally through a 20 cm posterolateral greater trochanteric incision, initial exposure was made.  The underlying subcutaneous tissues were incised along the skin incision.  The iliotibial band and gluteus maximus fascia were  incised longitudinally, revealing the underlying sciatic nerve, which was carefully protected.  The short external rotators at the hip and hip capsule were released off the femoral neck insertion and tagged, and the hip was posteriorly dislocated.  The femoral head was found to have significant grade 3 and 4 chondromalacia and DJD.  A femoral neck cut was then made 1.5-2 cm above the lesser trochanter in the appropriate amount of abduction and inclination.  The acetabulum was then exposed.  Retractors carefully placed. The labrum was removed.  Sequential acetabular reamers were then used to ream up to a #50 size, followed by a 50 trial cup which was placed, which was found to be an excellent fit.  This was then removed and the actual 50 PSL cup was hammered into position in the appropriate amount of abduction and anteversion. Two additional locking screws were placed, one in the 12 and one in the 1 oclock position, each of these 16 mm in length.  After this was done, a 10-degree polyethylene liner with a 10 degree lip in the posterolateral position was hammered into position with an excellent fit.  After this was done, the proximal femur was exposed.  Sequential axial reamers were used to ream up to a #6 size, followed by broaches to a #6 size, and with a #6 broach in place a +5 x 28 mm femoral head and neck was placed on the broach.  It was reduced, taken through a  full range of motion, found to be stable and leg lengths equal.  This was then removed.  At this point, then, the broach was removed, the femoral canal irrigated, and then the distal reaming was carried out to a 10.5 mm size.  After this was done, then the #6 Secure-Fit Plus prosthesis with the 10 mm distal size was hammered into position with an excellent fit, and then a +5 x 28 mm femoral head was placed and hammered onto the femoral neck with an excellent Morse taper fit.  The hip was then reduced, taken through a full range  of motion and found to be stable, leg lengths equal.  At this point it was felt that all the components were of excellent size, fit, and stability.  The wound was further irrigated.  The hip capsule and short external rotators of the hip were then reattached to their femoral neck insertion through two drill holes on the greater trochanter.  The iliotibial band and gluteus maximus fascia were closed with #1 Ethibond suture.  Subcutaneous tissues closed with 0 and 2-0 Vicryl.  Skin closed with staples, sterile dressings were applied, and a hip abduction pillow applied. The patient then turned supine, awakened, and extubated and taken to the recovery room in stable condition.  Needle and sponge counts correct x 2 at the end of the case. Dictated by:   Elana Alm Thurston Hole, M.D. Attending Physician:  Twana First DD:  12/04/01 TD:  12/04/01 Job: [id number]

## 2011-05-12 NOTE — Discharge Summary (Signed)
[place] [place]  Patient:    [name], HEVIA Visit Number: [id number] MRN: [id number]          Service Type: [facility] Location: 4100 [id number] Attending Physician:  Herold Harms Dictated by:   Julien Girt, P.A. Admit Date:  12/07/2001 Discharge Date: 12/12/2001                             Discharge Summary  ADMITTING DIAGNOSIS:  End-stage degenerative joint disease, left hip.  HISTORY OF PRESENT ILLNESS:  The patient is a 75 year old female with a history of end-stage DJD of left hip and a history of hypertension.  She has pain at night and at rest unrelieved by intra-articular injections, antiinflammatories or therapies.  She understands the risks, benefits and possible complications of a total hip and is without question.  PROCEDURES:  On December 04, 2001, the patient underwent a left total hip replacement and she tolerated the procedure well.  HOSPITAL COURSE:  On postop day #1, she had some muscle spasms.  Hemoglobin was 10.6.  She was metabolically stable.  INR was 1.1.  On postop day #2, the patient was doing well.  Hemoglobin was 10.0 with INR 1.4.  She was metabolically stable except for some high glucose.  On postop day #3, the patient was discharged to rehabilitation in stable condition.  Hemoglobin was 10.9 and INR 1.4.  She understood total hip precautions.  She was touchdown weightbearing and ambulatory with moderate assist.  DISPOSITION:  She was discharged to rehabilitation in stable condition.  DISCHARGE MEDICATIONS: 1. Percocet for pain. 2. Colace 100 mg one p.o. b.i.d. 3. Triamterene/hydrochlorothiazide one p.o. q.d. 4. Tiazac 180 mg one p.o. q.d.  FOLLOWUP:  We will continue to follow her on rehabilitation. Dictated by:   Julien Girt, P.A. Attending Physician:  Herold Harms DD:  12/24/01 TD:  12/24/01 Job: [id number]

## 2011-05-12 NOTE — Discharge Summary (Signed)
[name], [name]               ACCOUNT NO.:  [id number]   MEDICAL RECORD NO.:  [id number]          PATIENT TYPE:  INP   LOCATION:  5034                         FACILITY:  [facility]   PHYSICIAN:  Robert A. Thurston Hole, M.D. DATE OF BIRTH:  September 01, 1936   DATE OF ADMISSION:  04/22/2007  DATE OF DISCHARGE:  04/25/2007                               DISCHARGE SUMMARY   FINAL DIAGNOSES:  1. Status post left total knee replacement for end-stage DJD      (degenerative joint disease).  2. Hypertension.  3. Coronary artery disease.  4. Glaucoma.  5. Status post bilateral total hip replacements in 2003 and 2004.  6. Sleep apnea.  7. Type 2 diabetes mellitus.   HISTORY OF PRESENT ILLNESS:  This is a 75 year old female with history  of end-stage DJD of the left knee and chronic pain.  She presented to  our office for a preop evaluation for total knee replacement procedure.  She had progressively worsening pain which failed to respond to  conservative treatment.  She had significant decrease in her daily  activities due to the ongoing complaint.   HOSPITAL COURSE:  On April 22, 2007, the patient was taken to the [facility] OR and a left total knee replacement procedure was performed.  Surgeon was Salvatore Marvel, M.D.  The assistant was [employer],  P.A.  Anesthesia was general.  Operative time was 1 hour 15 minutes.  There were no surgical or anesthesia complications.  The patient was  transferred to the recovery room in stable condition.  On April 23, 2007, the patient was doing well.  Good pain control.  Temperature 97.9,  pulse 84, respirations 18, and blood pressure 149/86.  WBC 9.7,  hemoglobin 11.1 and platelets 226.  Sodium 138, potassium 3.7, chloride  103, CO2 28, BUN 9, creatinine 1.03, glucose 169.  Wound looked good.  Calf nontender.  Neurovascularly intact.  Discontinued Foley and PCA.  Started pharmacy protocol with Coumadin.  On April 24, 2007, the patient  was doing well.   Complaining of some shortness of breath.  No chest  pain.  Temperature 97.9, pulse 74, respirations 20, blood pressure  148/83.  O2 sat 96% on 2 liters per nasal cannula.  WBC 10.4, hemoglobin  11.1, hematocrit 32.7, and platelets 228,000.  Sodium 138, potassium  3.7, chloride 102, CO2 30, BUN 9, creatinine 1.03, glucose 174.  INR  1.2.  Dressing was changed.  Wound looks good.  No sign of infection.  Calf nontender.  Neurovascularly intact.  The patient did have some  wheezing.  We consulted Dr. August Saucer for respiratory issues, whom she has  seen in the past.  Dr. August Saucer evaluated the patient and changed some of  her medications.  On Apr 25, 2007, the patient is doing well.  Vital  signs are stable.  She is afebrile.  PT 19.8 and INR 1.6.  Sodium 140,  potassium 4.0, chloride 104, CO2 28, glucose 137, BUN 14, creatinine  1.05.  She has a bed offer at a skilled facility.  She is stable and  ready for transfer.  DISPOSITION:  [facility].   CONDITION:  Good and stable.   MEDICATIONS:  1. Coumadin pharmacy protocol to maintain INR 2 to 3.  2. Lovenox 40 mg 1 subcutaneous injection daily.  Discontinue when      Coumadin is therapeutic.  3. Colace 100 mg orally b.i.d.  4. Lisinopril 20 mg orally daily.  5. Zocor 40 mg orally daily.  6. Claritin 10 mg orally daily.  7. Aricept 10 mg orally nightly.  8. Norvasc 7.5 mg orally daily.  9. Januvia 100 mg orally daily.  10.Albuterol 2.5 mg/3 mL neb solution every 4 hours p.r.n.  11.Albuterol MDI inhaler 1 or 2 puffs every 4 hours p.r.n.  12.Ambien 5 mg orally nightly p.r.n.  13.Percocet 5/325 one or 2 tabs orally every 4 to 6 hours p.r.n. pain.   INSTRUCTIONS:  1. The patient will continue to work with physical therapy to improve      range of motion and strengthening along with her ambulation.  2. Daily dressing changes with 4 x 4 gauze and tape.  3. If there are any respiratory issues, please notify Dr. Willey Blade.  4. She will follow up in the office with Dr. Thurston Hole when she is 2      weeks postop.  Call for an appointment. If there are any questions      or concerns before that time, contact us immediately at [phone].      Genene Churn. Denton Meek.      Robert A. Thurston Hole, M.D.  Electronically Signed    JMO/MEDQ  D:  04/25/2007  T:  04/25/2007  Job:  [id number]

## 2011-05-12 NOTE — Op Note (Signed)
Stacey, [name]                         ACCOUNT NO.:  [id number]   MEDICAL RECORD NO.:  [id number]                   PATIENT TYPE:  INP   LOCATION:  NA                                   FACILITY:  [facility]   PHYSICIAN:  Robert A. Thurston Hole, M.D.              DATE OF BIRTH:  November 24, 1936   DATE OF PROCEDURE:  11/05/2002  DATE OF DISCHARGE:                                 OPERATIVE REPORT   PREOPERATIVE DIAGNOSIS:  Right hip degenerative joint disease.   POSTOPERATIVE DIAGNOSIS:  Right hip degenerative joint disease.   PROCEDURE:  Right total hip replacement using [employer] total hip system  with 52 mm PSL press-fit acetabulum with two locking screws and 10 degree  polyethylene liner.  Femoral component #6 press-fit secure-fit plus with 10  degree distal tip with +10 x 28 mm femoral head.   SURGEON:  Elana Alm. Thurston Hole, M.D.   ASSISTANT:  Julien Girt, P.A.   ANESTHESIA:  General anesthesia.   OPERATIVE TIME:  One hour and 20 minutes.   ESTIMATED BLOOD LOSS:  300 cc.   COMPLICATIONS:  None.   DESCRIPTION OF PROCEDURE:  The patient was brought to the operating room on  November 05, 2002, placed on the operating table in the supine position.  After an adequate level of general anesthesia was obtained, she had a Foley  catheter placed under sterile conditions and received Ancef 1 g IV  preoperatively for prophylaxis.  Her legs were examined.  She had  approximately one and a half inches of shortening of the right leg compared  to the left.  She had flexion to 90, extension to 0, internal and external  rotation of 40 degrees of the right hip. She was then turned in the right  lateral decubitus position and secured on the bed with a Mark frame.  Her  right hip and leg were prepped using sterile DuraPrep and draped using  sterile technique.  Originally, through a 20 cm longitudinal incision based  over the greater trochanter, initial exposure was made.  The underlying  subcutaneous tissues were incised in line with skin incision. The iliotibial  band and gluteus maximus fascia was incised longitudinally revealing the  underlying sciatic nerve which was carefully protected. The short external  rotators of the hip and hip capsule were carefully released off the femoral  neck insertions and tagged and then the hip was posteriorly dislocated.  The  femoral head was found to have grade III and IV articular cartilage defects  noted.  A femoral neck cut was then made 1.5 to 2 cm above the lesser  trochanter and the appropriate amount of abduction and anteversion.  The  femoral head was removed.  The acetabulum was exposed and carefully placed  retractors were placed.  Degenerative labrum was removed from around the  acetabulum. The acetabulum showed grade III and IV chondromalacia as well.  Sequential acetabular reamers  were then used to ream to a 52 mm size and the  appropriate amount of anteversion and abduction and then a 52 mm trial was  placed giving excellent fit.  It was then removed and the actual 52 mm PSL  cup was hammered into position with an excellent press-fit.  This was placed  in the appropriate amount of abduction and anteversion.  To additional  locking screws were placed in the 12 and 1 o'clock position, each 20 mm in  length.  A 10 degree polyethylene liner was then placed with a 10 degree lip  in the posterolateral position.  The femoral canal was then exposed.  Sequential reamers were used to ream up to a #6 size followed by broaches to  a #6 and with the #6 broach in place, a femoral head and neck trial was  placed.  The +10 length gave the appropriate restoration of the patient's  normal length with excellent stability through a full range of motion.  This  was then removed.  Distal reamers were used to a 10.5 size.  At this point  then, the actual #6 secure-fit plus with a distal 10 mm tip was hammered  into position with an excellent  press-fit.  The +10 x 28 mm femoral head was  hammered onto the femoral neck with an excellent Morse taper-fit.  The hip  was then reduced, taken through range of motion, stable up to 70 degrees of  internal rotation in both neutral and 30 degrees of adduction and stable in  abduction and external rotation and leg lengths were found to be equalized.  At this point, it was felt that all the components were of excellent size,  fit and stability. The wound was further irrigated with antibiotic solution.  Short external rotators and hip capsule were reattached to the femoral neck  insertion through two drill holes in the greater trochanter.  The iliotibial  band and gluteus maximus fascia were closed with #1 Ethibond sutures,  subcutaneous tissue closed with 0 and 2-0 Vicryl, skin closed with skin  staples, sterile dressings were applied. Hip adduction pill placed.  The  patient turned supine, awakened, extubated, and taken to the recovery room  in stable condition.  Needle and sponge counts correct x2 at the end of the  case.  Neurovascular status and pulses 2+ and symmetric.  Leg lengths found  to be equal.  Rotation equal.                                               Elana Alm. Thurston Hole, M.D.    RAW/MEDQ  D:  11/05/2002  T:  11/05/2002  Job:  [id number]

## 2011-05-12 NOTE — Discharge Summary (Signed)
[name], [name]               ACCOUNT NO.:  [id number]   MEDICAL RECORD NO.:  [id number]          PATIENT TYPE:  INP   LOCATION:  5034                         FACILITY:  [facility]   PHYSICIAN:  Robert A. Thurston Hole, M.D. DATE OF BIRTH:  04/01/1936   DATE OF ADMISSION:  04/22/2007  DATE OF DISCHARGE:  04/16/2007                               DISCHARGE SUMMARY   FINAL DIAGNOSIS:  1. Status post left total knee replacement for end stage degenerative      joint disease.  2. Hypertension.  3. Coronary artery disease.  4. Glaucoma.  5. Status post bilateral total hip replacements in 2003 and 2004.  6. Sleep apnea.  7. Type 2 diabetes mellitus.   HISTORY OF PRESENT ILLNESS:  75 year old female with a history of end  stage DJD of the left knee and chronic pain presented to our office for  preoperative evaluation for total knee replacement.  She had  progressively worsening pain with failure to respond to conservative  treatment.  Significant decrease in her daily activities due to the  ongoing complaints.   PREADMISSION LABS:  WBC 10.2, hemoglobin 13.2, hematocrit 40, platelets  221.  Protime 12.5, INR 0.9, PTT 22.  Sodium 136, potassium 4.2,  chloride 105, CO2 22, glucose 171, BUN 14, creatinine 1.31.  UA  negative.   HOSPITAL COURSE:  On April 22, 2007, the patient was taken to the [facility] Operating Room and a left total knee replacement procedure  performed.  Surgeon Molly Maduro A. Thurston Hole, M.D., assistant Julien Girt, P.A.-C.  Anesthesia general.  Operative time 1 hour 15  minutes.  There were no surgical or anesthesia complications and the  patient was transferred to the recovery room in stable condition.  On  April 23, 2007, the patient had good pain control but did complain of  some dyspnea.  No chest pain.  Temperature 97.9, pulse 84, respirations  18, blood pressure 149/86, O2 saturation 96% on O2 2 liters nasal  cannula.  Hemoglobin 11.1, hematocrit 32.7,  sodium 138, potassium 3.7,  chloride 103, CO2 28, BUN 9, creatinine 1.03, glucose 169.  INR 1.1.  Hemoglobin A1c 7.4.  Dressing clean, dry, and intact.  Calf nontender  and neurovascularly intact distally.  Started Albuterol nebulizer for  mild reactive airway disease.  Discontinued Foley catheter and PCA.  April 24, 2007, the patient again complaining of some dyspnea.  No chest  pain.  Vital signs stable, afebrile.  O2 saturation 96% on 2 liters  nasal cannula.  Hemoglobin 11.1, hematocrit 32.7, INR 2.2, sodium 138,  potassium 3.7, chloride 102, CO2 30, BUN 9, creatinine 1.03, glucose  174.  The wound looked good.  No signs of infection.  Consulted Dr. Willey Blade due to the patient's respiratory issues.  The patient has been  working with physical therapy.  Pharmacy protocol Coumadin also started.  On April 24, 2007, the patient was evaluated by Dr. August Saucer and some  medication changes were done.  Apr 25, 2007, the patient is doing well.  Vital signs stable, afebrile.  The wound looks good.  No  drains or signs  of infection.  Calf nontender, neurovascularly intact.  Per Dr. Wyline Mood,  she is stable and ready for transfer to [facility].   DISPOSITION:  Transfer to [facility].   CONDITION:  Good and stable.   DISCHARGE MEDICATIONS:  1. Lisinopril 20 mg p.o. daily.  2. Simvastatin 40 mg p.o. daily.  3. Allegra 180 mg p.o. daily.  4. Aricept 10 mg p.o. q.h.s.  5. Amlodipine 7.5 mg p.o. daily.  6. Januvia 100 mg p.o. q.a.m.  7. Humalog insulin sliding scale.  8. Coumadin pharmacy protocol, maintain INR 2-3.  9. Lovenox 40 mg one injection daily until Coumadin therapeutic with      INR 2-3.  10.Colace 100 mg p.o. b.i.d.  11.Percocet 5/325, 1-2 tabs p.o. q.4-6h. p.r.n. pain.  12.Albuterol MDI 1-2 puffs q.6-8h. p.r.n. for dyspnea.  13.Robaxin 500 mg 1 tab p.o. q.6h. p.r.n. spasms.  14.Ambien 5 mg p.o. q.h.s. p.r.n. sleep.   DISCHARGE  INSTRUCTIONS:  The patient will work with physical therapy to  improve knee range of motion, strengthening, and improve her ambulation.  Daily dressing changes with 4 by 4 gauze and tape.  She is weight  bearing as tolerated.  If there are any issues regarding respiratory  symptoms, call Dr. Willey Blade immediately.  She will follow up in the  office with Dr. Thurston Hole two weeks postop for recheck.  Notify our office  for any questions or concerns regarding her needs immediately at [phone].      Genene Churn. Denton Meek.      Robert A. Thurston Hole, M.D.  Electronically Signed    JMO/MEDQ  D:  04/25/2007  T:  04/25/2007  Job:  [id number]

## 2011-05-12 NOTE — Discharge Summary (Signed)
[place]  Patient:    [name], [name] Visit Number: [id number] MRN: [id number]          Service Type: [facility] Location: [id number] Attending Physician:  Herold Harms Dictated by:   Mcarthur Rossetti. Angiulli, P.A. Admit Date:  12/07/2001 Disc. Date: 12/12/01   CC:         Molly Maduro A. Thurston Hole, M.D.  Eric L. August Saucer, M.D.   Discharge Summary  DISCHARGE DIAGNOSES: 1. Left total hip replacement on December 04, 2001. 2. Anemia. 3. Hypertension. 4. History of right shoulder surgery.  HISTORY OF PRESENT ILLNESS:  This is a 75 year old, African-American female with progressive left hip pain secondary to end-stage osteoarthritis.  No relief noted with conservative care.  She underwent a left total hip replacement on December 11, per Dr. Thurston Hole.  She was placed on Coumadin for deep venous thrombosis prophylaxis and touchdown weightbearing.  She had postoperative pain management.  She had moderate assistance for ambulation. Latest INR of 1.4, hemoglobin 10 and chemistries were unremarkable.  She was admitted for a comprehensive rehabilitation program.  PAST MEDICAL HISTORY:  See discharge diagnoses.  PAST SURGICAL HISTORY: 1. Right shoulder surgery. 2. Hysterectomy.  ALLERGY:  CODEINE.  SOCIAL HISTORY:  No alcohol or tobacco use.  Her primary M.D. is Dr. Willey Blade.  She lives alone in [place].  She uses a cane prior to admission. 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  She does have local family in [place], but she opts to be discharged back to [place] where church members would provide assistance as needed.  MEDICATIONS: 1. Bextra. 2. Maxzide. 3. Tiazac.  HOSPITAL COURSE:  The patient did well while on rehabilitation services  with therapies initiated on a b.i.d. basis.  The following issues were followed during the patients rehabilitation course:  Pertaining to Ms. Griffins left total hip replacement, this remained stable.  The surgical site was healing nicely.  She would follow up with Dr. Thurston Hole for removal of staples.  She was touchdown weightbearing with hip precautions noted.  Neurovascular sensation remained intact.  She continued on Coumadin for deep venous thrombosis prophylaxis.  Venous Doppler studies prior to her discharge were negative. She would complete Coumadin protocol and follow by [facility].  Postoperative anemia with latest hemoglobin 11.5, hematocrit 33.5. There were no bleeding episodes.  Her blood pressures remained controlled with Maxzide and Tiazac.  There was no headache or dizziness noted.  She had no bowel or bladder disturbances.  Overall for her function and mobility, she was ambulating extended household distances with a walker and essentially independent to standby assist in all areas of activities of daily living with dressing, grooming and homemaking.  Overall, her strength and endurance greatly improved as she was encouraged with overall progress.  She was discharged to home with home health therapies.  LABORATORY DATA AND X-RAY FINDINGS:  Latest labs showed an INR of 2.2. Hemoglobin 11.5, hematocrit 33.5.  Sodium 139, potassium 3.6, BUN 19, creatinine 1.2.  DISCHARGE MEDICATIONS: 1. Coumadin with latest dosage of 5 mg.  She will complete Coumadin protocol. 2. Trinsicon twice daily. 3. Tiazac 180 mg daily. 4. Maxzide one tablet daily. 5. Tylox as needed for pain. 6. Tylenol as needed.  ACTIVITY:  Touchdown weightbearing with walker, total hip precautions.  DIET:  Regular.  FOLLOWUP:  Follow up with Dr. Thurston Hole in one week for removal of staples.  SPECIAL INSTRUCTIONS:  Call with any increased redness, drainage or fever.  No driving.  No aspirin or  ibuprofen while on Coumadin.  Home health nurse for prothrombin time per [facility] to complete Coumadin protocol.  Home health physical and occupational therapy.  She should follow up with Dr. Thurston Hole as advised for removal of staples and Dr. Willey Blade for medical management.   Dictated by:   Mcarthur Rossetti. Angiulli, P.A. Attending Physician:  Herold Harms DD:  12/11/01 TD:  12/11/01 Job: [id number]

## 2011-05-12 NOTE — Discharge Summary (Signed)
NAMEJIMMA, [name]                         ACCOUNT NO.:  [id number]   MEDICAL RECORD NO.:  [id number]                   PATIENT TYPE:  INP   LOCATION:  5028                                 FACILITY:  [facility]   PHYSICIAN:  Elana Alm. Thurston Hole, M.D.              DATE OF BIRTH:  05/28/1936   DATE OF ADMISSION:  11/05/2002  DATE OF DISCHARGE:  11/09/2002                                 DISCHARGE SUMMARY   ADMISSION DIAGNOSES:  1. End-stage degenerative joint disease, right hip.  2. Hypertension.  3. New onset type 2 diabetes and glaucoma.   DISCHARGE DIAGNOSES:  1. End-stage degenerative joint disease, right hip, status post total hip     replacement.  2. Hypertension.  3. Noninsulin-dependent diabetes and glaucoma.   HISTORY OF PRESENT ILLNESS:  The patient is a 75 year old black female with  a history of bilateral hip degenerative joint disease.  She had a left total  hip replacement 2003.  She tolerated that very well, now continues to have  right hip pain almost all of the time, unrelieved by rest, cortisone shots,  and anti-inflammatories.  She has pain with weightbearing, pain at night.  She understands the risks, benefits, and possible complications of a right  total hip replacement and is without question.   PROCEDURES IN HOUSE:  On November 05, 2002, the patient underwent a right  total hip replacement by Molly Maduro A. Thurston Hole, M.D.  She tolerated the procedure  well.  Postop day zero, medicine was consulted to manage her new onset  diabetes as well as her hypertension.  Postop day one, she was doing well  without complaint.  Hemoglobin was 10.7.  T-max was 99.1.  She was  metabolically stable.  Surgical wound was well-approximated.  Postop day  two, hemoglobin was 10; surgical wound was well-approximated.  T-max was 99.  She was metabolically stable.  Her Foley was discontinued.  Her IV was  discontinued.  She was placed on Percocet for pain.  Postop day three, the  patient  had no complaints except for nausea.  She had difficulty with  physical therapy secondary to her nausea, so it was deemed medically  necessary to keep her one more day.  She was afebrile, and her vital signs  were stable.  Her hemoglobin was 9.8.  Postop day four, hemoglobin was 9.6.  Her INR was 1.5.  Surgical wound was well-approximated.  She had no  erythema.  Her neurovascular exam was intact.  She was discharged to home in  stable condition, touchdown weightbearing on a regular diet.   DISCHARGE MEDICATIONS:  1. Percocet 5/325, 1-2 q.4-6h. p.r.n. pain.  2. Coumadin 7.5 mg tabs, take 1 by mouth every night.  3. Amaryl 2 mg 1 tab q.d.  4. Tiazac 180 mg 1 tab q.d.  5. Maxzide 37.5/25, 1 tab q.d.  6. Travatan eye drops 1 drop in each eye at night.  7. Colace 100 mg 1 tab b.i.d.  8. Senokot 2 tabs with dinner.   Home health to check her INR on November 10, 2002.  She will follow up with  Molly Maduro A. Thurston Hole, M.D. on November 18, 2002.  She will call with temperature  greater than 101, increased pain, increased redness or drainage.     Kirstin Shepperson, P.A.                  Robert A. Thurston Hole, M.D.    KS/MEDQ  D:  11/10/2002  T:  11/10/2002  Job:  [id number]

## 2011-05-12 NOTE — Op Note (Signed)
[name], [name]                         ACCOUNT NO.:  [id number]   MEDICAL RECORD NO.:  [id number]                   PATIENT TYPE:  AMB   LOCATION:  DSC                                  FACILITY:  [facility]   PHYSICIAN:  Katy Fitch. Naaman Plummer., M.D.          DATE OF BIRTH:  1936/11/02   DATE OF PROCEDURE:  06/09/2004  DATE OF DISCHARGE:                                 OPERATIVE REPORT   PREOPERATIVE DIAGNOSIS:  Entrapment neuropathy median nerve, left carpal  tunnel.   POSTOPERATIVE DIAGNOSIS:  Entrapment neuropathy median nerve, left carpal  tunnel.   OPERATION:  Release of left transverse carpal ligament.   SURGEON:  Katy Fitch. Sypher, M.D.   ASSISTANT:  Marveen Reeks. Dasnoit, P.A.-C.   ANESTHESIA:  General by LMA.   ANESTHESIOLOGIST:  Maren Beach, M.D.   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 Shirlean Kelly for evaluation of  bilateral hand numbness, left worse than right.  She was evaluated in the  office and had electrodiagnostic studies completed by Dr. Johna Roles revealing  rather severe bilateral carpal tunnel syndrome, left worse than right.  Due  to a failure to respond to nonoperative measures, she is brought to the  operating room at this time for release of her left transverse carpal  ligament.   PROCEDURE:  [name] is brought to the operating room and placed on  supine position on the operating table.  Following induction of general  anesthesia by LMA, the left arm was prepped with Betadine solution and  sterilely draped.  A pneumatic tourniquet was applied to the proximal  brachium.  Following exsanguination of the left arm with an Esmarch bandage,  the arterial tourniquet on the proximal brachium was inflated to 220 mmHg.  The procedure commenced with a short incision in the line of the ring finger  in the palm.  The subcutaneous tissues were carefully divided revealing the  palmar fascia.  This was split longitudinally to the common sensory branches  of the median nerve.  Bleeding points along the margin were  electrocauterized with bipolar current followed by repair of the wound with  intradermal 3-0 Prolene suture.  No masses or other predicaments were noted  in the carpal canal.   For aftercare, [name] is advised to elevate her hand for four days.  She is given a prescription for Dilaudid 2 mg 1-2 tablets p.o. q.4-6h.  p.r.n. pain, 20 tablets without refill.  She is also encouraged to use Advil  or Tylenol over the counter for milder pain.  She will return to our office  for follow up in 7-10 days for suture removal and advancement to an exercise  program.  Katy Fitch Naaman Plummer., M.D.    RVS/MEDQ  D:  06/09/2004  T:  06/09/2004  Job:  [id number]   cc:   Hewitt Shorts, M.D.  [address]  [place]  [place] [address]  Fax: [phone]   Lind Guest. August Saucer, M.D.  [address] [address]  [place]  [place] [address]  Fax: [phone]

## 2011-05-12 NOTE — Cardiovascular Report (Signed)
[name], [name]                         ACCOUNT NO.:  [id number]   MEDICAL RECORD NO.:  [id number]                   PATIENT TYPE:  OIB   LOCATION:  2899                                 FACILITY:  [facility]   PHYSICIAN:  Eduardo Osier. Sharyn Lull, M.D.              DATE OF BIRTH:  [birth year]   DATE OF PROCEDURE:  11/03/2003  DATE OF DISCHARGE:                              CARDIAC CATHETERIZATION   PROCEDURE:  1. Left cardiac catheterization.  2. Selective left and right coronary angiography.  3. Left ventriculography.  4. Aortography.  5. Visualization of bilateral renal arteries via right groin using Judkins     technique.   INDICATIONS FOR PROCEDURE:  [name] is a 75 year old black female with  past medical history significant for hypertension, non-insulin-dependent  diabetes mellitus, hypercholesterolemia, strong family history of coronary  artery disease, history of arthritis.  Complains of feeling dizzy and tired  off and on.  The patient denies any chest pain, nausea, vomiting, or  diaphoresis.  Denies any palpitations, lightheadedness, or syncope.  Denies  PND, orthopnea, leg swelling.  Due to vague symptoms of feeling dizziness,  tired, fatigued, and multiple risk factors, patient underwent Persantine  Cardiolite on October 16, 2003 which showed minor ST depression in  inferolateral leads after Persantine infusion and Cardiolite scan showed  minor mid to distal inferolateral wall ischemia with EF of 45%.   PAST MEDICAL HISTORY:  As above.   PAST SURGICAL HISTORY:  1. Hysterectomy 1992 for fibroid of uterus.  2. Bilateral hip replacement in 2002 and 2003.  3. Right dislocation of shoulder.   ALLERGIES:  No known drug allergies.   MEDICATIONS:  1. Cardizem CD 180 mg p.o. daily.  2. Avalide 300/12.5 p.o. daily.  3. Glucophage XR 500 mg two tablets daily in the evening.  4. Amaryl 4 mg one and a half tablet daily.  5. Bextra 20 mg one tablet daily.  6. Baby aspirin  81 mg daily.  7. Nitrostat sublingual p.r.n.   SOCIAL HISTORY:  She is divorced, has three children.  No history of smoking  or alcohol abuse.  She works as a Runner, broadcasting/film/video.   FAMILY HISTORY:  Father died of cardiac arrest at the age of 33.  He also  had permanent pacemaker.  Mother had coronary artery disease.  Subsequently,  she had coronary artery bypass grafting.  One brother is in good health.   PHYSICAL EXAMINATION:  GENERAL:  She is alert, awake, oriented x3, in no  acute distress.  VITAL SIGNS:  Blood pressure 140/80, pulse 80, regular.  HEENT:  Conjunctivae were pink.  NECK:  Supple.  No JVD.  No bruit.  LUNGS:  Clear to auscultation without rhonchi or rales.  CARDIOVASCULAR:  S1, S2 was normal.  There was no S3 gallop or murmur.  ABDOMEN:  Soft.  Bowel sounds were present.  Nontender.  EXTREMITIES:  No clubbing, cyanosis, edema.  IMPRESSION:  1. Dizziness, tiredness probably angina equivalent.  2. Positive Persantine Cardiolite.  3. Hypertension.  4. Non-insulin-dependent diabetes mellitus.  5. Hypercholesterolemia.  6. Degenerative joint disease.  7. Positive family history of coronary artery disease.  8. Morbid obesity.   Discussed with patient regarding left catheterization, possible PTCA and  stenting, its risks, i.e., death, MI, stroke, need for emergency CABG, risk  of restenosis, local vascular complications, etc. and consented for PCI.   PROCEDURE:  After obtaining the informed consent, patient was brought to the  catheterization laboratory and was placed on fluoroscopy table.  Right groin  was prepped and draped in usual fashion.  2% Xylocaine was used for local  anesthesia in right groin.  With the help of thin wall needle a 6-French  arterial sheath was placed.  Next, the sheath was aspirated and flushed.  Next, 6-French left Judkins catheter was advanced over the wire under  fluoroscopic guidance up to the ascending aorta where it was pulled out.  The  catheter was aspirated and connected to the manifold.  Catheter was  further advanced and engaged into left coronary ostium.  Multiple views of  the left system were taken.  Next, the catheter was disengaged and was  pulled out over the wire and was replaced with 6-French right Judkins  catheter which was advanced over the wire under fluoroscopic guidance up to  the ascending aorta where it was pulled out.  The catheter was aspirated and  connected to the manifold.  Catheter was further advanced and engaged into  right coronary ostium.  Multiple views of the right system were taken.  Next, the catheter was disengaged and was pulled out over the wire and was  replaced with 6-French pigtail catheter which was advanced over the wire  under fluoroscopic guidance up to the ascending aorta.  Catheter was further  advanced across the aortic valve into the LV.  LV pressures were recorded.  Next, LV graphy was done in 30 degree RAO position.  Post angiographic  pressures were recorded from LV and then pullback pressures were recorded  from the aorta.  There was no gradient across the aortic valve.  Next, the  pigtail catheter was pulled down up to the descending abdominal aorta.  Aortography was done in PA position.  Next, the pigtail catheter was pulled  out over the wire.  Sheaths were aspirated and flushed.   FINDINGS:  LV showed good LV systolic function, moderate LVH, EF of 50-55%.  Left main was patent.  LAD was patent.  Diagonal 1 was very small which was  patent.  Diagonal 2 was small which had 40-50% ostial and mid stenosis at  the bifurcation.  Distally the vessel is very small.  Diagonal 3 is very,  very small vessel which is less than 1 mm in size and has 80% mid stenosis.  The left circumflex appears to be patent which tapers down in AV groove  after giving off OM 3.  OM 1 is large which is patent.  OM 2 is small which  is patent.  OM 3 is very, very small which is diffusely diseased  distally.  RCA is patent.  Aortography showed no  abdominal aortic aneurysm.  Bilateral renal arteries were patent.  Arteriotomy was closed with Perclose without complications.  The patient  tolerated procedure well.  The patient was transferred to recovery room in  stable condition.  Eduardo Osier. Sharyn Lull, M.D.    MNH/MEDQ  D:  11/03/2003  T:  11/03/2003  Job:  [id number]   cc:   Minerva Areola L. August Saucer, M.D.  [address] [address]  [place]  [place] [address]  Fax: [phone]   Cath Lab

## 2011-08-16 ENCOUNTER — Ambulatory Visit: Payer: BC Managed Care – PPO | Admitting: Internal Medicine

## 2011-09-01 ENCOUNTER — Ambulatory Visit: Payer: BC Managed Care – PPO | Admitting: Internal Medicine

## 2011-09-12 ENCOUNTER — Encounter (HOSPITAL_COMMUNITY)
Admission: RE | Admit: 2011-09-12 | Discharge: 2011-09-12 | Disposition: A | Discharge status: Home / Self Care | Payer: Medicare Other | Source: Ambulatory Visit | Attending: Neurosurgery | Admitting: Neurosurgery

## 2011-09-12 ENCOUNTER — Other Ambulatory Visit (HOSPITAL_COMMUNITY): Payer: Self-pay | Admitting: Neurosurgery

## 2011-09-12 DIAGNOSIS — IMO0002 Reserved for concepts with insufficient information to code with codable children: Secondary | ICD-10-CM

## 2011-09-12 LAB — TYPE AND SCREEN
ABO/RH(D): O POS
Antibody Screen: NEGATIVE

## 2011-09-12 LAB — BASIC METABOLIC PANEL
BUN: 14 mg/dL (ref 6–23)
CO2: 28 mEq/L (ref 19–32)
Calcium: 9.9 mg/dL (ref 8.4–10.5)
Chloride: 103 mEq/L (ref 96–112)
Creatinine, Ser: 1.29 mg/dL — ABNORMAL HIGH (ref 0.50–1.10)
GFR calc Af Amer: 49 mL/min — ABNORMAL LOW (ref >60)
GFR calc non Af Amer: 41 mL/min — ABNORMAL LOW (ref >60)
Glucose, Bld: 120 mg/dL — ABNORMAL HIGH (ref 70–99)
Potassium: 3.5 mEq/L (ref 3.5–5.1)
Sodium: 140 mEq/L (ref 135–145)

## 2011-09-12 LAB — CBC
HCT: 32.6 % — ABNORMAL LOW (ref 36.0–46.0)
Hemoglobin: 10.9 g/dL — ABNORMAL LOW (ref 12.0–15.0)
MCH: 28.9 pg (ref 26.0–34.0)
MCHC: 33.4 g/dL (ref 30.0–36.0)
MCV: 86.5 fL (ref 78.0–100.0)
Platelets: 256 10*3/uL (ref 150–400)
RBC: 3.77 MIL/uL — ABNORMAL LOW (ref 3.87–5.11)
RDW: 15.7 % — ABNORMAL HIGH (ref 11.5–15.5)
WBC: 6.8 10*3/uL (ref 4.0–10.5)

## 2011-09-12 LAB — SURGICAL PCR SCREEN
MRSA, PCR: NEGATIVE
Staphylococcus aureus: POSITIVE — AB

## 2011-09-14 ENCOUNTER — Other Ambulatory Visit (HOSPITAL_COMMUNITY): Payer: Self-pay | Admitting: Cardiology

## 2011-09-14 DIAGNOSIS — Z01818 Encounter for other preprocedural examination: Secondary | ICD-10-CM

## 2011-09-15 ENCOUNTER — Ambulatory Visit (HOSPITAL_COMMUNITY): Payer: BC Managed Care – PPO

## 2011-09-15 ENCOUNTER — Encounter (HOSPITAL_COMMUNITY)
Admission: RE | Admit: 2011-09-15 | Discharge: 2011-09-15 | Disposition: A | Discharge status: Home / Self Care | Payer: Medicare Other | Source: Ambulatory Visit | Attending: Cardiology | Admitting: Cardiology

## 2011-09-15 DIAGNOSIS — Z01818 Encounter for other preprocedural examination: Secondary | ICD-10-CM

## 2011-09-15 MED ORDER — TECHNETIUM TC 99M TETROFOSMIN IV KIT
30.0000 | PACK | Freq: Once | INTRAVENOUS | Status: AC | PRN
  Administered 2011-09-15: 30 via INTRAVENOUS

## 2011-09-15 MED ORDER — TECHNETIUM TC 99M TETROFOSMIN IV KIT
10.0000 | PACK | Freq: Once | INTRAVENOUS | Status: AC | PRN
  Administered 2011-09-15: 10 via INTRAVENOUS

## 2011-09-18 ENCOUNTER — Inpatient Hospital Stay (HOSPITAL_COMMUNITY): Admission: RE | Admit: 2011-09-18 | Payer: Medicare Other | Source: Ambulatory Visit | Admitting: Neurosurgery

## 2011-09-26 ENCOUNTER — Ambulatory Visit (HOSPITAL_COMMUNITY)
Admission: RE | Admit: 2011-09-26 | Discharge: 2011-09-27 | Disposition: A | Discharge status: Home / Self Care | Payer: Medicare Other | Source: Ambulatory Visit | Attending: Cardiology | Admitting: Cardiology

## 2011-09-26 DIAGNOSIS — I2510 Atherosclerotic heart disease of native coronary artery without angina pectoris: Secondary | ICD-10-CM | POA: Insufficient documentation

## 2011-09-26 DIAGNOSIS — E119 Type 2 diabetes mellitus without complications: Secondary | ICD-10-CM | POA: Insufficient documentation

## 2011-09-26 DIAGNOSIS — I10 Essential (primary) hypertension: Secondary | ICD-10-CM | POA: Insufficient documentation

## 2011-09-26 DIAGNOSIS — E7800 Pure hypercholesterolemia, unspecified: Secondary | ICD-10-CM | POA: Insufficient documentation

## 2011-09-26 LAB — GLUCOSE, CAPILLARY
Glucose-Capillary: 94 mg/dL (ref 70–99)
Glucose-Capillary: 82 mg/dL (ref 70–99)
Glucose-Capillary: 125 mg/dL — ABNORMAL HIGH (ref 70–99)

## 2011-09-27 LAB — BASIC METABOLIC PANEL
CO2: 26 mEq/L (ref 19–32)
Calcium: 9.2 mg/dL (ref 8.4–10.5)
Creatinine, Ser: 1.06 mg/dL (ref 0.50–1.10)
GFR calc Af Amer: 59 mL/min — ABNORMAL LOW (ref >90)
GFR calc non Af Amer: 51 mL/min — ABNORMAL LOW (ref >90)

## 2011-09-27 LAB — CBC
MCH: 29.4 pg (ref 26.0–34.0)
MCHC: 33.7 g/dL (ref 30.0–36.0)
MCV: 87.4 fL (ref 78.0–100.0)
Platelets: 188 10*3/uL (ref 150–400)
RDW: 16 % — ABNORMAL HIGH (ref 11.5–15.5)

## 2011-09-27 LAB — GLUCOSE, CAPILLARY: Glucose-Capillary: 104 mg/dL — ABNORMAL HIGH (ref 70–99)

## 2011-10-04 NOTE — Cardiovascular Report (Signed)
[name], [name]               ACCOUNT NO.:  [id number]  MEDICAL RECORD NO.:  [id number]  LOCATION:  2507                         FACILITY:  [facility]  PHYSICIAN:  Eduardo Osier. Sharyn Lull, M.D. DATE OF BIRTH:  1936-06-22  DATE OF PROCEDURE:  09/26/2011 DATE OF DISCHARGE:                           CARDIAC CATHETERIZATION   PROCEDURE: 1. Left cardiac catheterization with selective left and right coronary     angiography, left ventriculography via right groin using Judkins     technique. 2. Successful percutaneous transluminal coronary angioplasty to mid     diagonal 2 using 2.5 x 12 mm long MINI TREK balloon.  SURGEON:  Eduardo Osier. Sharyn Lull, MD  INDICATIONS FOR PROCEDURE:  Ms. Boster is a 75 year old black female with past medical history significant for hypertension, non-insulin- dependent diabetes mellitus, hypercholesteremia, positive family history for coronary artery disease, degenerative joint disease was seen in my office for preop clearance for back surgery.  The patient was referred to me as the patient was noted to have abnormal EKG with new septal Q waves and new right bundle-branch block.  The patient denies any chest pain, nausea, vomiting, diaphoresis, but complains of feeling weak, tired, fatigued and shortness of breath with minimal exertion.  Denies any palpitation, lightheadedness, or syncope.  Denies PND, orthopnea, or leg swelling.  Due to abnormal EKG, multiple risk factors, and exertional dyspnea and feeling weak, tired, probably angina equivalent, the patient underwent a Lexiscan Myoview stress test on September 15, 2011 which showed small inferolateral infarct with peri-infarct ischemia with EF of 70%.  The patient is admitted for elective left cath, possible PCI.  I discussed at length with the patient regarding various options of treatment, i.e. medical versus invasive.  Also discussed at length regarding PCI with plain balloon angioplasty versus bare metal stent  requiring dual antiplatelet for 4-6 weeks or drug-eluting stent requiring dual antiplatelet medication for at least 1 year.  The patient consented for POBA and if needed bare metal stenting.  DESCRIPTION OF PROCEDURE:  After obtaining the informed consent, the patient was brought to the cath lab and was placed on fluoroscopy table. Right groin was prepped and draped in usual fashion.  Xylocaine 1% was used for local anesthesia in the right groin.  With the help of thin- wall needle, 5-French arterial sheath was placed.  The sheath was aspirated and flushed.  Next, 5-French left Judkins catheter was advanced over the wire under fluoroscopic guidance up to the ascending aorta.  Wire was pulled out, the catheter was aspirated and connected to the manifold.  Catheter was further advanced and engaged into left coronary ostium.  Multiple views of the left system were taken.  Next, the catheter was disengaged and was pulled out over the wire and was replaced with 5-French right Judkins catheter which was advanced over the wire under fluoroscopic guidance up to the ascending aorta.  Wire was pulled out, the catheter was aspirated and connected to the manifold.  Catheter was further advanced and engaged into right coronary ostium.  Multiple views of the right system were taken.  Next, the catheter was disengaged and was pulled out over the wire and was replaced with 5-French pigtail catheter  which was advanced over the wire under fluoroscopic guidance up to the ascending aorta.  Catheter was further advanced across the aortic valve into the LV.  LV pressures were recorded.  Next, LV-graft was done in 30-degree RAO position.  Post- angiographic pressures were recorded from LV and then pullback pressures were recorded from the aorta.  There was no gradient across the aortic valve.  Next, a pigtail catheter was pulled out over the wire.  Sheaths were aspirated and flushed.  FINDINGS:  LV showed  good LV systolic function.  EF of 55-60%.  Left main was patent.  LAD has 15-20% mid stenosis.  Diagonal 1 is very small which is patent.  Diagonal 2 has 60-65% smooth ostial and then 90-95% mid stenosis with haziness with bifurcation with smaller superior branch.  Diagonal 2 is very very small.  Left circumflex has 10-15% proximal stenosis.  OM-1 is moderate-sized which has minimal plaquing. OM-2 is small which is patent.  OM-3 is very small.  RCA is patent.  PDA is patent.  PLV branches are very small which are patent.  INTERVENTIONAL PROCEDURE:  Successful PTCA to mid diagonal 2 was done using 2.5 x 12 mm long MINI TREK balloon going up to 8-10 atmospheric pressure.  Multiple inflations were done.  Lesion was dilated from 90- 95% to less than 20% residual with excellent TIMI grade 3 distal flow without evidence of dissection or distal embolization.  The patient received weight-based Angiomax and 180 mg of Brilinta prior to the procedure.  The patient tolerated the procedure well.  There were no complications.  The patient was transferred to recovery room in stable condition.     Eduardo Osier. Sharyn Lull, M.D.     MNH/MEDQ  D:  09/26/2011  T:  09/26/2011  Job:  [id number]  cc:   Minerva Areola L. August Saucer, M.D.  Electronically Signed by Rinaldo Cloud M.D. on 10/04/2011 08:43:42 PM

## 2011-10-04 NOTE — Discharge Summary (Signed)
[name], [name]               ACCOUNT NO.:  [id number]  MEDICAL RECORD NO.:  [id number]  LOCATION:  2507                         FACILITY:  [facility]  PHYSICIAN:  Eduardo Osier. Sharyn Lull, M.D. DATE OF BIRTH:  1936-02-19  DATE OF ADMISSION:  09/26/2011 DATE OF DISCHARGE:  09/27/2011                              DISCHARGE SUMMARY   ADMITTING DIAGNOSES: 1. Coronary artery disease. 2. Abnormal EKG. 3. Positive Persantine Myoview. 4. Exertional dyspnea. 5. Weakness. 6. Rule out coronary insufficiency. 7. Hypertension. 8. Diabetes mellitus. 9. Hypercholesteremia. 10.Degenerative joint disease. 11.Positive family history of coronary artery disease. 12.Chronic anemia.  DISCHARGE DIAGNOSES: 1. Coronary artery disease. 2. Positive Persantine. 3. Positive Lexiscan Myoview. 4. Status post left cath and percutaneous transluminal coronary     angioplasty to mid diagonal 2. 5. Hypertension. 6. Diabetes mellitus. 7. Hypercholesteremia. 8. Chronic anemia. 9. Degenerative joint disease. 10.Dementia. 11.Positive family history of coronary artery disease.  DISCHARGE HOME MEDICATIONS: 1. Pepcid 20 mg 1 tablet twice daily. 2. Micardis 80 mg 1 tablet daily. 3. Nitrostat 0.4 mg sublingual use as directed. 4. Toprol-XL 25 mg 1 tablet daily. 5. Brilinta 90 mg 1 tablet twice daily. 6. Enteric-coated aspirin 81 mg 1 tablet daily. 7. Albuterol inhaler 2 puffs as needed every 4 hours as before. 8. Aricept 10 mg 1 tablet daily as before. 9. Robitussin A-C eye drops twice daily, 1 drop as before. 10.Brimonidine 0.15% one drop in both eyes as before. 11.Bromfenac Ophthalmic 0.15% one drop in left eye every morning as     before. 12.Folic acid 1 mg 1 tablet daily. 13.Humalog insulin sliding scale as before. 14.Januvia 100 mg 1 tablet daily at bedtime. 15.Lumigan 1 drop in both eyes as before. 16.Methotrexate 25 mg IM every Friday as before. 17.Prednisone suspension ophthalmic 1% one drop in left  eye twice     daily as before. 18.Prednisone 5 mg 2 tablets daily as before. 19.Simvastatin 40 mg 1 tablet daily at bedtime. 20.Travatan ophthalmic 0.004% one drop in both eyes daily at bedtime     as before.  DIET:  Low salt, low cholesterol, 1800-calorie ADA diet.  Follow up with me in 1 week.  Post PTCA instructions have been given. The patient will be scheduled for phase II cardiac rehab.  Condition at discharge is stable.  The patient has been advised to monitor blood pressure and blood sugar daily.  BRIEF HISTORY AND HOSPITAL COURSE:  [name] is a 75 year old black female with past medical history significant for hypertension, non- insulin-dependent diabetes mellitus, hypercholesteremia, positive family history of coronary artery disease, degenerative joint disease, dementia who was seen in my office for preop clearance for back surgery.  The patient had abnormal EKG with new septal Q-waves and right bundle-branch block.  The patient denies any chest pain, nausea, vomiting, or diaphoresis, but complains of being weak, tired, and fatigued and shortness of breath.  Denies any palpitation, lightheadedness, or syncope.  The patient denies PND, orthopnea, leg swelling, abnormal EKG, multiple risk factors, and exertional dyspnea, feeling weakness, probable angina equivalent.  The patient underwent Lexiscan Myoview scan on September 15, 2011, which showed small inferolateral wall infarct with peri-infarct ischemia with an EF of 70%.  PAST MEDICAL HISTORY:  As above.  PAST SURGICAL HISTORY:  She had hysterectomy in the past, had bilateral hip surgery in the past, had right shoulder surgery in the past, had left knee surgery in the past, had eye cataract surgery in the past.  ALLERGIES:  No known drug allergies.  MEDICATIONS AT HOME:  She was on Toprol, Micardis, simvastatin, Januvia, aspirin, methotrexate, timolol, sliding scale, and Aricept.  SOCIAL HISTORY:  She is  divorced, 3 children.  No history of smoking or alcohol abuse.  Worked as Runner, broadcasting/film/video in the past.  FAMILY HISTORY:  Positive for coronary artery disease.  PHYSICAL EXAMINATION:  GENERAL:  On examination, she is alert, awake, and oriented x3 in no acute distress. VITAL SIGNS:  Blood pressure was 114/70, pulse was 78. HEENT:  Conjunctivae is pink. NECK:  Supple.  No JVD, no bruit. LUNGS:  Clear to auscultation without rhonchi or rales. CARDIOVASCULAR:  S1 and S2 was normal.  Soft systolic murmur. ABDOMEN:  Soft.  Bowel sounds are present, nontender. EXTREMITIES:  There is no clubbing, cyanosis, or edema.  LABORATORY DATA:  Hemoglobin was 10.9, hematocrit 32.6, white count of 600.  Sodium 155, potassium 2.5, BUN 13, creatinine of 0.29, glucose 120.  Labs done on September 12, 2011, status post procedure, hemoglobin 10.3, hematocrit 30.4, white count 6.5, platelet count of 188,000. Sodium was 142, potassium 4.0, BUN 9, creatinine 1.06, glucose 111.  BRIEF HOSPITAL COURSE:  The patient was a.m. admit and underwent left cardiac cath with selective left and right coronary angiography and PTCA to mid diagonal one as per procedure report.  The patient tolerated procedure well.  There were no complications.  Discussed with the patient at length prior to the procedure regarding various options of treatment, i.e., balloon angioplasty versus bare-metal stent versus DES with balloon angioplasty and if needed bare-metal stenting.  The patient subsequently underwent PTCA to diagonal 2 as per procedure report.  The patient tolerated procedure well with no complications.  The patient did not have any episodes of chest pain during the hospital stay.  Her groin was stable with no evidence of hematoma or bruit.  The patient has been ambulating.  Phase 1 cardiac rehab was called.  The patient has ambulated in the hallway without any problems.  Stable.  The patient will be discharged home on above  medications and will be followed up in my office in 1 week.     Eduardo Osier. Sharyn Lull, M.D.     MNH/MEDQ  D:  09/27/2011  T:  09/27/2011  Job:  [id number]  cc:   Minerva Areola L. August Saucer, M.D.  Electronically Signed by Rinaldo Cloud M.D. on 10/04/2011 08:43:36 PM

## 2011-10-09 LAB — CBC
Hemoglobin: 13.1
RBC: 4.85

## 2011-10-09 LAB — URINALYSIS, ROUTINE W REFLEX MICROSCOPIC
Bilirubin Urine: NEGATIVE
Ketones, ur: NEGATIVE
Nitrite: NEGATIVE
Specific Gravity, Urine: 1.012
Urobilinogen, UA: 0.2

## 2011-10-09 LAB — DIFFERENTIAL
Lymphocytes Relative: 24
Lymphs Abs: 2.6
Monocytes Absolute: 0.5
Monocytes Relative: 5
Neutro Abs: 7.6

## 2011-10-09 LAB — POCT I-STAT CREATININE
Creatinine, Ser: 1.2
Operator id: 285491

## 2011-10-25 ENCOUNTER — Emergency Department (HOSPITAL_COMMUNITY): Payer: Medicare Other

## 2011-10-25 ENCOUNTER — Inpatient Hospital Stay (HOSPITAL_COMMUNITY)
Admission: EM | Admit: 2011-10-25 | Discharge: 2011-10-31 | DRG: 378 | Disposition: A | Discharge status: Home / Self Care | Payer: Medicare Other | Attending: Internal Medicine | Admitting: Internal Medicine

## 2011-10-25 DIAGNOSIS — Z9861 Coronary angioplasty status: Secondary | ICD-10-CM

## 2011-10-25 DIAGNOSIS — E119 Type 2 diabetes mellitus without complications: Secondary | ICD-10-CM | POA: Diagnosis present

## 2011-10-25 DIAGNOSIS — M069 Rheumatoid arthritis, unspecified: Secondary | ICD-10-CM | POA: Diagnosis present

## 2011-10-25 DIAGNOSIS — I2510 Atherosclerotic heart disease of native coronary artery without angina pectoris: Secondary | ICD-10-CM | POA: Diagnosis present

## 2011-10-25 DIAGNOSIS — G309 Alzheimer's disease, unspecified: Secondary | ICD-10-CM | POA: Diagnosis present

## 2011-10-25 DIAGNOSIS — D62 Acute posthemorrhagic anemia: Secondary | ICD-10-CM | POA: Diagnosis present

## 2011-10-25 DIAGNOSIS — G4733 Obstructive sleep apnea (adult) (pediatric): Secondary | ICD-10-CM | POA: Diagnosis present

## 2011-10-25 DIAGNOSIS — E785 Hyperlipidemia, unspecified: Secondary | ICD-10-CM | POA: Diagnosis present

## 2011-10-25 DIAGNOSIS — IMO0002 Reserved for concepts with insufficient information to code with codable children: Secondary | ICD-10-CM

## 2011-10-25 DIAGNOSIS — Z794 Long term (current) use of insulin: Secondary | ICD-10-CM

## 2011-10-25 DIAGNOSIS — D72829 Elevated white blood cell count, unspecified: Secondary | ICD-10-CM | POA: Diagnosis present

## 2011-10-25 DIAGNOSIS — Z7982 Long term (current) use of aspirin: Secondary | ICD-10-CM

## 2011-10-25 DIAGNOSIS — M1990 Unspecified osteoarthritis, unspecified site: Secondary | ICD-10-CM | POA: Diagnosis present

## 2011-10-25 DIAGNOSIS — Z79899 Other long term (current) drug therapy: Secondary | ICD-10-CM

## 2011-10-25 DIAGNOSIS — F0390 Unspecified dementia without behavioral disturbance: Secondary | ICD-10-CM | POA: Diagnosis present

## 2011-10-25 DIAGNOSIS — F0280 Dementia in other diseases classified elsewhere without behavioral disturbance: Secondary | ICD-10-CM | POA: Diagnosis present

## 2011-10-25 DIAGNOSIS — K5731 Diverticulosis of large intestine without perforation or abscess with bleeding: Principal | ICD-10-CM | POA: Diagnosis present

## 2011-10-25 DIAGNOSIS — I10 Essential (primary) hypertension: Secondary | ICD-10-CM | POA: Diagnosis present

## 2011-10-25 LAB — LIPID PANEL
HDL: 52 mg/dL (ref >39)
LDL Cholesterol: 71 mg/dL (ref 0–99)
Total CHOL/HDL Ratio: 2.7 RATIO
Triglycerides: 78 mg/dL (ref <150)
VLDL: 16 mg/dL (ref 0–40)

## 2011-10-25 LAB — PROTIME-INR: INR: 1.08 (ref 0.00–1.49)

## 2011-10-25 LAB — CBC
HCT: 28.7 % — ABNORMAL LOW (ref 36.0–46.0)
MCH: 28.5 pg (ref 26.0–34.0)
MCHC: 32.8 g/dL (ref 30.0–36.0)
MCV: 87 fL (ref 78.0–100.0)
Platelets: 175 10*3/uL (ref 150–400)
RDW: 14.7 % (ref 11.5–15.5)

## 2011-10-25 LAB — DIFFERENTIAL
Eosinophils Absolute: 0.1 10*3/uL (ref 0.0–0.7)
Eosinophils Relative: 1 % (ref 0–5)
Lymphocytes Relative: 23 % (ref 12–46)
Lymphs Abs: 2.5 10*3/uL (ref 0.7–4.0)
Monocytes Absolute: 0.7 10*3/uL (ref 0.1–1.0)
Monocytes Relative: 6 % (ref 3–12)

## 2011-10-25 LAB — HEPATIC FUNCTION PANEL
Alkaline Phosphatase: 90 U/L (ref 39–117)
Bilirubin, Direct: <0.1 mg/dL (ref 0.0–0.3)
Total Protein: 7 g/dL (ref 6.0–8.3)

## 2011-10-25 LAB — BASIC METABOLIC PANEL
CO2: 21 mEq/L (ref 19–32)
Creatinine, Ser: 1.2 mg/dL — ABNORMAL HIGH (ref 0.50–1.10)
Sodium: 141 mEq/L (ref 135–145)

## 2011-10-25 LAB — GLUCOSE, CAPILLARY

## 2011-10-25 MED ORDER — IOHEXOL 300 MG/ML  SOLN
100.0000 mL | Freq: Once | INTRAMUSCULAR | Status: AC | PRN
  Administered 2011-10-25: 100 mL via INTRAVENOUS

## 2011-10-26 LAB — BASIC METABOLIC PANEL
BUN: 15 mg/dL (ref 6–23)
Creatinine, Ser: 1.18 mg/dL — ABNORMAL HIGH (ref 0.50–1.10)
GFR calc non Af Amer: 45 mL/min — ABNORMAL LOW (ref >90)
Glucose, Bld: 111 mg/dL — ABNORMAL HIGH (ref 70–99)
Potassium: 3.7 mEq/L (ref 3.5–5.1)

## 2011-10-26 LAB — CBC
HCT: 26.9 % — ABNORMAL LOW (ref 36.0–46.0)
HCT: 26.8 % — ABNORMAL LOW (ref 36.0–46.0)
Hemoglobin: 8.7 g/dL — ABNORMAL LOW (ref 12.0–15.0)
Hemoglobin: 9 g/dL — ABNORMAL LOW (ref 12.0–15.0)
MCH: 28.8 pg (ref 26.0–34.0)
MCHC: 32.3 g/dL (ref 30.0–36.0)
MCHC: 33.6 g/dL (ref 30.0–36.0)
MCV: 87.1 fL (ref 78.0–100.0)
RDW: 14.8 % (ref 11.5–15.5)

## 2011-10-26 LAB — GLUCOSE, CAPILLARY
Glucose-Capillary: 165 mg/dL — ABNORMAL HIGH (ref 70–99)
Glucose-Capillary: 102 mg/dL — ABNORMAL HIGH (ref 70–99)

## 2011-10-26 MED ORDER — BESIFLOXACIN HCL 0.6 % OP SUSP
1.0000 [drp] | Freq: Two times a day (BID) | OPHTHALMIC | Status: DC
  Administered 2011-10-30 – 2011-10-31 (×2): 1 [drp] via OPHTHALMIC

## 2011-10-26 MED ORDER — BROMFENAC SODIUM 0.09 % OP SOLN
1.0000 [drp] | Freq: Every day | OPHTHALMIC | Status: DC
  Administered 2011-10-31: 1 [drp] via OPHTHALMIC

## 2011-10-27 LAB — COMPREHENSIVE METABOLIC PANEL
ALT: 11 U/L (ref 0–35)
Alkaline Phosphatase: 66 U/L (ref 39–117)
CO2: 22 mEq/L (ref 19–32)
Chloride: 111 mEq/L (ref 96–112)
GFR calc Af Amer: 53 mL/min — ABNORMAL LOW (ref >90)
GFR calc non Af Amer: 46 mL/min — ABNORMAL LOW (ref >90)
Glucose, Bld: 105 mg/dL — ABNORMAL HIGH (ref 70–99)
Potassium: 3.7 mEq/L (ref 3.5–5.1)
Sodium: 142 mEq/L (ref 135–145)
Total Protein: 5.3 g/dL — ABNORMAL LOW (ref 6.0–8.3)

## 2011-10-27 LAB — CARDIAC PANEL(CRET KIN+CKTOT+MB+TROPI)
Relative Index: INVALID (ref 0.0–2.5)
Troponin I: <0.3 ng/mL (ref <0.30)

## 2011-10-27 LAB — CBC
HCT: 26.8 % — ABNORMAL LOW (ref 36.0–46.0)
Hemoglobin: 8.3 g/dL — ABNORMAL LOW (ref 12.0–15.0)
MCH: 29.4 pg (ref 26.0–34.0)
MCHC: 34 g/dL (ref 30.0–36.0)
MCV: 86.2 fL (ref 78.0–100.0)
RBC: 3.11 MIL/uL — ABNORMAL LOW (ref 3.87–5.11)
RBC: 2.82 MIL/uL — ABNORMAL LOW (ref 3.87–5.11)
RDW: 14.4 % (ref 11.5–15.5)
WBC: 13.6 10*3/uL — ABNORMAL HIGH (ref 4.0–10.5)
WBC: 10.3 10*3/uL (ref 4.0–10.5)

## 2011-10-27 LAB — GLUCOSE, CAPILLARY
Glucose-Capillary: 98 mg/dL (ref 70–99)
Glucose-Capillary: 105 mg/dL — ABNORMAL HIGH (ref 70–99)

## 2011-10-27 LAB — HEMOGLOBIN AND HEMATOCRIT, BLOOD: HCT: 25.3 % — ABNORMAL LOW (ref 36.0–46.0)

## 2011-10-27 NOTE — H&P (Signed)
NAMEALIZZON, DIOGUARDI               ACCOUNT NO.:  [id number]  MEDICAL RECORD NO.:  [id number]  LOCATION:  [facility]                         FACILITY:  [facility]  PHYSICIAN:  Clydia Llano, MD       DATE OF BIRTH:  [birth year]  DATE OF ADMISSION:  10/25/2011 DATE OF DISCHARGE:                             HISTORY & PHYSICAL   PRIMARY CARE PHYSICIAN:  Eric L. August Saucer, MD  REASON FOR ADMISSION:  Rectal bleeding.  HISTORY OF PRESENT ILLNESS:  Ms. Zani is a 75 year old African American female with past medical history of diabetes mellitus type 2 and hypertension.  The patient came into the hospital because of rectal bleeding.  The patient reported she was perfectly fine when she went to the bed last night.  This morning, she woke up with some abdominal cramping, and when she had bowel movement, she felt there is dark blood coming out and after that, she had several bloody bowel movements as well as some accidents.  The patient came into the emergency department for further evaluation.  The patient denies any nausea, vomiting, or hematemesis.  The patient mentioned she did have history of diverticulosis with diverticulitis and confirmed on colonoscopy less than 5 years ago.  Upon initial evaluation in the emergency department, the patient was found to have hemoglobin of 9.4, hemoglobin about 3 weeks ago was 10.3.  CT scan was done, showed extensive diverticulosis of the colon with minimal pericolic stranding in the sigmoid can represent low-level diverticulitis or scarring.  The patient referred to observation in the hospital for further evaluation.  PAST MEDICAL HISTORY: 1. Diabetes mellitus type 2. 2. Hypertension. 3. Dyslipidemia. 4. Degenerative joint disease. 5. Rheumatoid arthritis, on chronic prednisone. 6. Chronic anemia. 7. Coronary artery disease with recent positive Myoview. 8. Recent cardiac catheterization on September 26, 2011, with successful     PTCA using balloon  angioplasty.  MEDICATIONS: 1. Humalog insulin 36 units every morning. 2. Toprol-XL 25 mg p.o. daily. 3. Brillinta p.o. b.i.d. 4. Simvastatin 40 mg p.o. at bedtime. 5. Prednisone 5 mg p.o. daily. 6. Prednisolone ophthalmic suspension 1%, left eye. 7. Nitroglycerin sublingual 0.4% every 5 minutes as needed up to 3     doses for chest pain. 8. Micardis 80 mg p.o. daily. 9. Methotrexate 25 mg injection on Fridays. 10.Lumigan 0.01% daily at bedtime. 11.Januvia 100 mg p.o. nightly. 12.Folic acid 1 mg p.o. daily. 13.Bromfenac sodium ophthalmic 0.15%, left eye eyedrops. 14.Brimonidine 0.15% ophthalmic solution in both eyes. 15.Besifloxacin 0.6% ophthalmic solution left eye 1 drop b.i.d. 16.Aspirin 81 mg p.o. daily. 17.Aricept 10 mg p.o. daily. 18.Albuterol 90 mcg 2 puffs every 4 hours as needed for shortness of     breath.  ALLERGIES:  CODEINE causes GI upset.  REVIEW OF SYSTEMS:  GENERAL:  Denies fever, weight loss, loss of appetite.  CARDIOVASCULAR:  Denies chest pain.  Denies dyspnea. RESPIRATORY:  Denies cough.  GI:  Positive for abdominal cramping, discomfort, blood in stool.  Rectal bleeding.  Denies nausea, vomiting diarrhea.  GU:  Denies dysuria, urgency, frequency.  MUSCULOSKELETAL: Denies neck pain, trauma.  SKIN:  Denies rash.  NEURO:  Denies weakness, headache.  PSYCHIATRIC:  Denies behavioral changes.  ALLERGY:  Denies any rash.  HEMATOLOGIC:  Denies easy bruising or recent blood transfusion.  PHYSICAL EXAMINATION:  VITAL SIGNS:  Temperature is 98.1, respirations 16, pulse is 101, blood pressure is 118/51, O2 sats 99%. GENERAL:  The patient awake, alert and oriented x3, in no acute distress. HEAD AND FACE:  Normocephalic, atraumatic.  Eyes, pupils equal, reactive to light and accommodation. ENT:  Ears, nose, and throat normal.  Mouth and throat normal. NECK:  Supple.  No masses. SPINE:  Entire spine nontender. CARDIOVASCULAR:  Regular rate and rhythm.  No murmurs,  rubs, or gallops. RESPIRATORY:  Breath sounds clear to auscultation bilaterally. CHEST:  Nontender. ABDOMEN:  Bowel sounds heard.  Diffuse mild tenderness. RECTAL:  Done by emergency department.  The chaperone was present.  Anal tone normal.  Gross blood in the rectum.  No stool in rectum. EXTREMITIES:  Baseline range of motion.  Moves extremities. NEURO:  Alert, awake, and oriented x3.  Motor intact in all extremities. Mental status appears baseline. SKIN:  Color normal.  No rash.  LABORATORY DATA: 1. BMP.  Sodium 141, potassium 3.8, chloride 107, bicarb is 28,     glucose 170, BUN 16, creatinine is 1.2. 2. CBC, WBC is 10.8, hemoglobin 9.4, hematocrit 28.7, platelets are     175.  BRIEF HISTORY EXAMINATION: 1. Rectal bleeding.  The patient does have extensive diverticulosis,     probably this is diverticular bleeding.  The patient will be     admitted to the hospital for observation and checking of the blood     counts in the morning.  I will stop the Brilinta and the aspirin     for now.  The patient did not have coronary stent in the recent     catheterization on October 2 , 2012, it was balloon angioplasty. 2. Anemia, acute-on-chronic anemia.  The patient has chronic anemia     exacerbated by acute blood loss.  We will repeat the CBC in the     morning.  If it is less than 8, probably we will transfuse. 3. Diabetes mellitus type 2.  The patient will be on clear liquids     because of for colon rest and probably, we will advance as     tolerated in the morning, so she will be off of her insulin and we     will do sliding scale in the hospital and then we will stop the     Januvia as well. 4. Hypertension and coronary artery disease.  We will continue beta-     blockers.  Stop the Micardis if the blood pressure drops because of     the bleeding. 5. Rheumatoid arthritis.  The patient on methotrexate and prednisone.     We will continue the prednisone.     Clydia Llano,  MD     ME/MEDQ  D:  10/25/2011  T:  10/25/2011  Job:  [id number]  cc:   Minerva Areola L. August Saucer, M.D.  Electronically Signed by Clydia Llano  on 10/27/2011 07:07:04 AM

## 2011-10-28 LAB — CBC
HCT: 25 % — ABNORMAL LOW (ref 36.0–46.0)
Hemoglobin: 8.8 g/dL — ABNORMAL LOW (ref 12.0–15.0)
Hemoglobin: 9.7 g/dL — ABNORMAL LOW (ref 12.0–15.0)
Hemoglobin: 8.5 g/dL — ABNORMAL LOW (ref 12.0–15.0)
MCH: 29.4 pg (ref 26.0–34.0)
MCH: 29.4 pg (ref 26.0–34.0)
MCHC: 34.2 g/dL (ref 30.0–36.0)
MCV: 86 fL (ref 78.0–100.0)
Platelets: 121 10*3/uL — ABNORMAL LOW (ref 150–400)
Platelets: 108 10*3/uL — ABNORMAL LOW (ref 150–400)
RBC: 2.99 MIL/uL — ABNORMAL LOW (ref 3.87–5.11)
RBC: 3.3 MIL/uL — ABNORMAL LOW (ref 3.87–5.11)
RBC: 2.92 MIL/uL — ABNORMAL LOW (ref 3.87–5.11)
WBC: 13.4 10*3/uL — ABNORMAL HIGH (ref 4.0–10.5)
WBC: 13.1 10*3/uL — ABNORMAL HIGH (ref 4.0–10.5)

## 2011-10-28 LAB — GLUCOSE, CAPILLARY
Glucose-Capillary: 153 mg/dL — ABNORMAL HIGH (ref 70–99)
Glucose-Capillary: 111 mg/dL — ABNORMAL HIGH (ref 70–99)

## 2011-10-28 LAB — PREPARE RBC (CROSSMATCH)

## 2011-10-28 LAB — OCCULT BLOOD X 1 CARD TO LAB, STOOL: Fecal Occult Bld: POSITIVE

## 2011-10-28 MED ORDER — PANTOPRAZOLE SODIUM 40 MG PO TBEC
40.0000 mg | DELAYED_RELEASE_TABLET | Freq: Two times a day (BID) | ORAL | Status: DC
  Administered 2011-10-29 – 2011-10-31 (×6): 40 mg via ORAL
  Filled 2011-10-28 (×3): qty 1

## 2011-10-28 MED ORDER — PREDNISONE 5 MG PO TABS
5.0000 mg | ORAL_TABLET | Freq: Every day | ORAL | Status: DC
  Filled 2011-10-28 (×2): qty 1

## 2011-10-28 MED ORDER — ONDANSETRON HCL 4 MG/2ML IJ SOLN
4.0000 mg | Freq: Four times a day (QID) | INTRAMUSCULAR | Status: DC | PRN
  Administered 2011-10-30: 4 mg via INTRAVENOUS

## 2011-10-28 MED ORDER — MORPHINE SULFATE 2 MG/ML IJ SOLN: 1.0000 mg | INTRAMUSCULAR | Status: DC | PRN

## 2011-10-28 MED ORDER — ACETAMINOPHEN 325 MG PO TABS
650.0000 mg | ORAL_TABLET | ORAL | Status: DC | PRN
  Administered 2011-10-29 – 2011-10-31 (×2): 650 mg via ORAL
  Filled 2011-10-28: qty 2

## 2011-10-28 MED ORDER — PREDNISOLONE ACETATE 1 % OP SUSP
1.0000 [drp] | Freq: Two times a day (BID) | OPHTHALMIC | Status: DC
  Administered 2011-10-29 – 2011-10-31 (×5): 1 [drp] via OPHTHALMIC

## 2011-10-28 MED ORDER — METOPROLOL SUCCINATE ER 25 MG PO TB24
25.0000 mg | ORAL_TABLET | Freq: Every day | ORAL | Status: DC
  Administered 2011-10-29 – 2011-10-31 (×3): 25 mg via ORAL
  Filled 2011-10-28 (×4): qty 1

## 2011-10-28 MED ORDER — SIMVASTATIN 40 MG PO TABS
40.0000 mg | ORAL_TABLET | Freq: Every day | ORAL | Status: DC
  Administered 2011-10-29 – 2011-10-31 (×3): 40 mg via ORAL
  Filled 2011-10-28 (×4): qty 1

## 2011-10-28 MED ORDER — FOLIC ACID 1 MG PO TABS
1.0000 mg | ORAL_TABLET | Freq: Every day | ORAL | Status: DC
  Administered 2011-10-29 – 2011-10-31 (×3): 1 mg via ORAL
  Filled 2011-10-28 (×3): qty 1

## 2011-10-28 MED ORDER — BRIMONIDINE TARTRATE 0.2 % OP SOLN
1.0000 [drp] | Freq: Two times a day (BID) | OPHTHALMIC | Status: DC
  Administered 2011-10-29 – 2011-10-31 (×5): 1 [drp] via OPHTHALMIC

## 2011-10-28 MED ORDER — CIPROFLOXACIN IN D5W 400 MG/200ML IV SOLN
400.0000 mg | Freq: Two times a day (BID) | INTRAVENOUS | Status: DC
  Administered 2011-10-29 – 2011-10-31 (×5): 400 mg via INTRAVENOUS
  Filled 2011-10-28 (×8): qty 200

## 2011-10-28 MED ORDER — BIMATOPROST 0.03 % OP SOLN
1.0000 [drp] | Freq: Every day | OPHTHALMIC | Status: DC
  Administered 2011-10-29 – 2011-10-30 (×2): 1 [drp] via OPHTHALMIC

## 2011-10-28 MED ORDER — INSULIN ASPART 100 UNIT/ML ~~LOC~~ SOLN
0.0000 [IU] | Freq: Three times a day (TID) | SUBCUTANEOUS | Status: DC
  Administered 2011-10-31: 1 [IU] via SUBCUTANEOUS
  Administered 2011-10-31: 2 [IU] via SUBCUTANEOUS
  Filled 2011-10-28: qty 3

## 2011-10-28 MED ORDER — ONDANSETRON HCL 4 MG PO TABS: 4.0000 mg | ORAL_TABLET | Freq: Four times a day (QID) | ORAL | Status: DC | PRN

## 2011-10-28 MED ORDER — GLUCERNA SHAKE PO LIQD
237.0000 mL | Freq: Three times a day (TID) | ORAL | Status: DC
  Administered 2011-10-29 – 2011-10-31 (×6): 237 mL via ORAL
  Filled 2011-10-28 (×13): qty 237

## 2011-10-28 MED ORDER — METRONIDAZOLE IN NACL 5-0.79 MG/ML-% IV SOLN
500.0000 mg | Freq: Three times a day (TID) | INTRAVENOUS | Status: DC
  Administered 2011-10-29 – 2011-10-31 (×8): 500 mg via INTRAVENOUS
  Filled 2011-10-28 (×13): qty 100

## 2011-10-28 MED ORDER — SODIUM CHLORIDE 0.9 % IV SOLN
INTRAVENOUS | Status: DC
  Administered 2011-10-30: 01:00:00 via INTRAVENOUS

## 2011-10-28 MED ORDER — DONEPEZIL HCL 10 MG PO TABS
10.0000 mg | ORAL_TABLET | Freq: Every day | ORAL | Status: DC
  Administered 2011-10-29 – 2011-10-30 (×2): 10 mg via ORAL
  Filled 2011-10-28 (×4): qty 1

## 2011-10-28 MED ORDER — ALBUTEROL SULFATE HFA 108 (90 BASE) MCG/ACT IN AERS
2.0000 | INHALATION_SPRAY | RESPIRATORY_TRACT | Status: DC | PRN
  Filled 2011-10-28: qty 6.7

## 2011-10-28 MED ORDER — NITROGLYCERIN 0.4 MG SL SUBL: 0.4000 mg | SUBLINGUAL_TABLET | SUBLINGUAL | Status: DC | PRN

## 2011-10-29 LAB — CBC
Hemoglobin: 8.4 g/dL — ABNORMAL LOW (ref 12.0–15.0)
Platelets: 101 10*3/uL — ABNORMAL LOW (ref 150–400)
RBC: 2.83 MIL/uL — ABNORMAL LOW (ref 3.87–5.11)
WBC: 12.4 10*3/uL — ABNORMAL HIGH (ref 4.0–10.5)

## 2011-10-29 LAB — HEMOGLOBIN AND HEMATOCRIT, BLOOD
HCT: 26 % — ABNORMAL LOW (ref 36.0–46.0)
Hemoglobin: 9 g/dL — ABNORMAL LOW (ref 12.0–15.0)

## 2011-10-29 LAB — TYPE AND SCREEN
Antibody Screen: NEGATIVE
Unit division: 0
Unit division: 0

## 2011-10-29 LAB — BASIC METABOLIC PANEL
CO2: 21 mEq/L (ref 19–32)
Chloride: 111 mEq/L (ref 96–112)
Glucose, Bld: 92 mg/dL (ref 70–99)
Potassium: 3.7 mEq/L (ref 3.5–5.1)
Sodium: 141 mEq/L (ref 135–145)

## 2011-10-29 LAB — GLUCOSE, CAPILLARY: Glucose-Capillary: 100 mg/dL — ABNORMAL HIGH (ref 70–99)

## 2011-10-29 NOTE — Progress Notes (Signed)
Subjective:  *Patient denies any abdominal pain. Patient denies any chest pain nausea or vomiting. Patient denies any further bleeding per rectum. The patient denies any dizziness lightheadedness.**  Objective:  Vital Signs in the last 24 hours: Temp:  [97.6 F (36.4 C)-98.6 F (37 C)] 98.4 F (36.9 C) (11/04 0500) Pulse Rate:  [77-91] 77  (11/04 0500) Resp:  [14-18] 18  (11/04 0500) BP: (100-153)/(60-86) 153/86 mmHg (11/04 0500) SpO2:  [100 %] 100 % (11/04 0500) Weight:  [71.3 kg (157 lb 3 oz)-72.122 kg (159 lb)] 159 lb (72.122 kg) (11/04 0500)  Intake/Output from previous day: 11/03 0701 - 11/04 0700 In: 320 [P.O.:320] Out: 604 [Urine:600; Stool:4] Intake/Output from this shift:    Physical Exam: General appearance: alert, cooperative and no distress Neck: no adenopathy, no carotid bruit, no JVD, supple, symmetrical, trachea midline and thyroid not enlarged, symmetric, no tenderness/mass/nodules Lungs: clear to auscultation bilaterally Heart: regular rate and rhythm, S1, S2 normal and S3 present Abdomen: soft, non-tender; bowel sounds normal; no masses,  no organomegaly Extremities: extremities normal, atraumatic, no cyanosis or edema Pulses: 2+ and symmetric  Lab Results:  [facility] 10/29/11 0640 10/28/11 1718  WBC 12.4* 13.1*  HGB 8.4* 8.5*  PLT 101* 108*    Basename 10/29/11 0640 10/27/11 0622  NA 141 142  K 3.7 3.7  CL 111 111  CO2 21 22  GLUCOSE 92 105*  BUN 12 16  CREATININE 1.12* 1.15*    Basename 10/27/11 0117  TROPONINI <0.30   Hepatic Function Panel  Basename 10/27/11 0622  PROT 5.3*  ALBUMIN 2.9*  AST 15  ALT 11  ALKPHOS 66  BILITOT 0.8  BILIDIR --  IBILI --      Assessment/Plan:  Impression #1 status post lower GI bleed stable Coronary artery disease status post PTCA to diagonal 2 in October of 2012 stable. Hypertension Diabetes mellitus Obstructive sleep apnea Dementia Resolving diverticulitis. Plan continue present medical  management  Check CBC in a.m.        LOS: 4 days    Sanaya Gwilliam N 10/29/2011, 9:50 AM

## 2011-10-30 LAB — CBC
HCT: 25.6 % — ABNORMAL LOW (ref 36.0–46.0)
Hemoglobin: 8.9 g/dL — ABNORMAL LOW (ref 12.0–15.0)
MCH: 30 pg (ref 26.0–34.0)
MCHC: 34.8 g/dL (ref 30.0–36.0)
MCV: 86.2 fL (ref 78.0–100.0)

## 2011-10-30 LAB — DIFFERENTIAL
Basophils Absolute: 0 10*3/uL (ref 0.0–0.1)
Eosinophils Relative: 7 % — ABNORMAL HIGH (ref 0–5)
Lymphocytes Relative: 19 % (ref 12–46)
Lymphs Abs: 2 10*3/uL (ref 0.7–4.0)
Monocytes Absolute: 0.7 10*3/uL (ref 0.1–1.0)

## 2011-10-30 LAB — GLUCOSE, CAPILLARY: Glucose-Capillary: 132 mg/dL — ABNORMAL HIGH (ref 70–99)

## 2011-10-30 LAB — OCCULT BLOOD X 1 CARD TO LAB, STOOL: Fecal Occult Bld: POSITIVE

## 2011-10-30 NOTE — Progress Notes (Signed)
[name] is a 75 y.o. female patient admitted to the hospital for rectal bleeding.. No diagnosis found. No past medical history on file. Current Facility-Administered Medications  Medication Dose Route Frequency Provider Last Rate Last Dose  . 0.9 %  sodium chloride infusion   Intravenous Continuous Willey Blade, MD 50 mL/hr at 10/30/11 0059    . acetaminophen (TYLENOL) tablet 650 mg  650 mg Oral Q4H PRN Willey Blade, MD   650 mg at 10/29/11 1056  . albuterol (PROVENTIL HFA;VENTOLIN HFA) 108 (90 BASE) MCG/ACT inhaler 2 puff  2 puff Inhalation Q4H PRN Willey Blade, MD      . Besifloxacin HCl 0.6 % SUSP 1 drop  1 drop Left Eye BID Willey Blade, MD      . bimatoprost (LUMIGAN) 0.03 % ophthalmic solution 1 drop  1 drop Both Eyes QHS Willey Blade, MD   1 drop at 10/29/11 2337  . brimonidine (ALPHAGAN) 0.2 % ophthalmic solution 1 drop  1 drop Both Eyes BID Willey Blade, MD   1 drop at 10/30/11 1023  . bromfenac (XIBROM) 0.09 % ophthalmic solution 1 drop  1 drop Left Eye Daily Willey Blade, MD      . ciprofloxacin (CIPRO) IVPB 400 mg  400 mg Intravenous Q12H Severiano Gilbert, PHARMD   400 mg at 10/30/11 1649  . donepezil (ARICEPT) tablet 10 mg  10 mg Oral QHS Willey Blade, MD   10 mg at 10/29/11 2338  . feeding supplement (GLUCERNA SHAKE) liquid 237 mL  237 mL Oral TID Willey Blade, MD   237 mL at 10/30/11 1600  . folic acid (FOLVITE) tablet 1 mg  1 mg Oral Daily Willey Blade, MD   1 mg at 10/30/11 1022  . insulin aspart (novoLOG) injection 0-9 Units  0-9 Units Subcutaneous TID WC Willey Blade, MD      . metoprolol succinate (TOPROL-XL) 24 hr tablet 25 mg  25 mg Oral Daily Willey Blade, MD   25 mg at 10/30/11 1022  . metroNIDAZOLE (FLAGYL) IVPB 500 mg  500 mg Intravenous Q8H Severiano Gilbert, PHARMD   500 mg at 10/30/11 1345  . morphine 2 MG/ML injection 1-2 mg  1-2 mg Intravenous Q4H PRN Willey Blade, MD      . nitroGLYCERIN (NITROSTAT) SL tablet 0.4 mg  0.4 mg Sublingual Q5 Min x 3 PRN Willey Blade, MD      . ondansetron [facility])  injection 4 mg  4 mg Intravenous Q6H PRN Willey Blade, MD   4 mg at 10/30/11 1815   Or  . ondansetron (ZOFRAN) tablet 4 mg  4 mg Oral Q6H PRN Willey Blade, MD      . pantoprazole (PROTONIX) EC tablet 40 mg  40 mg Oral BID AC Severiano Gilbert, PHARMD   40 mg at 10/30/11 1649  . prednisoLONE acetate (PRED FORTE) 1 % ophthalmic suspension 1 drop  1 drop Left Eye BID Willey Blade, MD   1 drop at 10/30/11 1021  . simvastatin (ZOCOR) tablet 40 mg  40 mg Oral q1800 Willey Blade, MD   40 mg at 10/30/11 1710   Allergies  Allergen Reactions  . Codeine     REACTION: nausea, GI upset   Active Problems:  * No active hospital problems. *   Blood pressure 126/74, pulse 93, temperature 98 F (36.7 C), temperature source Oral, resp. rate 22, height 5' (1.524 m), weight 156 lb 9.6 oz (71.033 kg), SpO2 99.00%. Patient reports that she continues to do  better. She's not had any further melanotic stools or hematochezia. She's not had significant solid food intake. She's only just recently beginning to have an appetite. Patient denies chest pains or shortness of breath. She's experiencing mild confusion tonight. She asks questions again regarding what happened to her lower to have the bleeding events.  OBJECTIVE: Well-developed well-nourished black female in no acute distress. Vital signs as noted. HEENT: No sinus tenderness. No posterior cervical nodes. LUNGS: Clear to auscultation and percussion. No CVA tenderness. CARDIOVASCULAR: Normal S1, S2, without S3. No murmurs or rubs. ABDOMEN: Bowel sounds present. No masses or tenderness. EXTREMITIES: Negative Homans. No edema. Hemoglobin: 8.9. Previous value from 11/4  IMPRESSION: 1. History recurrent rectal bleeding improving. 2. Probable diverticulosis with secondary GI bleed. Rule out diverticulitis. Patient on antibiotics. 3. Status post coronary angioplasty with post procedure use of anticoagulant. 4. History of mild Alzheimer's with mild memory  impairment.  PLAN: 1. Continue monitoring patient for an additional 12 hours. Continue with every 12 hour H&H's. 2. Anticipate discharge home should her hemoglobin remained stable and or increase. 3. Continue to hold anticoagulant. 4. Gradually advance diet as tolerated.   August Saucer, Rickelle Sylvestre 10/30/2011

## 2011-10-31 LAB — CBC
HCT: 26.8 % — ABNORMAL LOW (ref 36.0–46.0)
MCV: 87 fL (ref 78.0–100.0)
RDW: 15.1 % (ref 11.5–15.5)
WBC: 10.5 10*3/uL (ref 4.0–10.5)

## 2011-10-31 LAB — GLUCOSE, CAPILLARY: Glucose-Capillary: 166 mg/dL — ABNORMAL HIGH (ref 70–99)

## 2011-10-31 MED ORDER — MORPHINE SULFATE 4 MG/ML IJ SOLN: 1.0000 mg | INTRAMUSCULAR | Status: DC | PRN

## 2011-10-31 NOTE — Progress Notes (Signed)
10-31-11 UR completed. Mashell Sieben RN BSN  

## 2012-01-02 ENCOUNTER — Encounter (HOSPITAL_COMMUNITY): Payer: Self-pay

## 2012-01-02 ENCOUNTER — Other Ambulatory Visit: Payer: Self-pay | Admitting: Neurosurgery

## 2012-01-06 HISTORY — PX: DECOMPRESSIVE LUMBAR LAMINECTOMY LEVEL 1: SHX5791

## 2012-01-07 NOTE — Discharge Summary (Signed)
Physician Discharge Summary  Patient ID: [name] MRN: [id number] DOB/AGE: 1936/07/09 76 y.o.  Admit date: 10/25/2011 Discharge date: 10/31/2011   Discharge Diagnoses:   1. Rectal bleeding improved. 2. Diverticulosis with probable mild diverticulitis. 3. Status post coronary angioplasty with postprocedural anticoagulant, discontinued. 4. Degenerative joint disease of the LS-spine. 5. Mild Alzheimer's currently stable.  Discharged Condition: improved.  Operations/Procedues: transfusion of one unit of packed RBCs.   Hospital Course:  See admission history and physical for details. Patient is a 76 year old to divorce black female with history of diabetes mellitus and hypertension. She recently underwent coronary angioplasty which she tolerated well. She was placed on the anticoagulant Brilinta. On the morning of admission she developed abdominal crampy pain with dark blood when she experienced a bowel movement. This was followed by several bloody bowel movements as well. Patient subsequently went to the emergency room for further evaluation. She was admitted for observation and therapy.  Patient was admitted to telemetry due to her history of coronary angioplasty. Her Brilinta and aspirin therapy was discontinued. She was initially placed on bowel rest with clear liquids. Her hemoglobin was monitored closely as well. Over the subsequent days days her hemoglobin continued to slowly decrease. She was placed on empiric antibiotics for possible associated diverticulitis. Her hemoglobin did drop to 8.4. She was given one unit of packed RBCs based on a history of coronary artery disease. With bowel rest patient had no further bloody stools. Her diet was slowly advanced which she tolerated well without exacerbation of her pain or recurrent rectal bleeding. By 11/ 6 she was feeling much better. Her hemoglobin was 9.0 and rising. She was suddenly discharged home with close  observation.    Significant Diagnostic Studies: none  Disposition: Home or Self Care  Discharge Orders    Future Appointments: Provider: Department: Dept Phone: Center:   01/10/2012 1:00 PM [facility]  None     Future Orders Please Complete By Expires   Diet - low sodium heart healthy      Increase activity slowly      No wound care      Call MD for:  severe uncontrolled pain      Scheduling Instructions:   Call for recurring rectal bleeding.   Discharge instructions      Comments:   Follow up with primary care physician in two weeks.     Discharge Medication List as of 10/31/2011  7:07 PM    CONTINUE these medications which have NOT CHANGED   Details  albuterol (PROVENTIL HFA;VENTOLIN HFA) 108 (90 BASE) MCG/ACT inhaler Inhale 2 puffs into the lungs every 6 (six) hours as needed. Shortness of breath , Until Discontinued, Historical Med    Besifloxacin HCl 0.6 % SUSP Place 1 drop into the left eye 2 (two) times daily.  , Until Discontinued, Historical Med    bimatoprost (LUMIGAN) 0.03 % ophthalmic solution Place 1 drop into both eyes at bedtime.  , Until Discontinued, Historical Med    brimonidine (ALPHAGAN) 0.15 % ophthalmic solution Place 1 drop into both eyes 2 (two) times daily.  , Until Discontinued, Historical Med    bromfenac (XIBROM) 0.09 % ophthalmic solution Place 1 drop into the left eye every morning.  , Until Discontinued, Historical Med    donepezil (ARICEPT) 10 MG tablet Take 10 mg by mouth at bedtime as needed.  , Until Discontinued, Historical Med    folic acid (FOLVITE) 1 MG tablet Take 1 mg by  mouth daily.  , Until Discontinued, Historical Med    METHOTREXATE SODIUM LPF IJ Inject 25 mg/day as directed 7 days. On Friday , Until Discontinued, Historical Med    metoprolol succinate (TOPROL-XL) 25 MG 24 hr tablet Take 25 mg by mouth daily.  , Until Discontinued, Historical Med    nitroGLYCERIN (NITROSTAT) 0.4 MG SL tablet Place 0.4 mg  under the tongue every 5 (five) minutes as needed. Chest pain , Until Discontinued, Historical Med    prednisoLONE acetate (PRED FORTE) 1 % ophthalmic suspension Place 1 drop into the left eye 2 (two) times daily.  , Until Discontinued, Historical Med    predniSONE (DELTASONE) 5 MG tablet Take 5 mg by mouth daily.  , Until Discontinued, Historical Med    simvastatin (ZOCOR) 40 MG tablet Take 40 mg by mouth at bedtime.  , Until Discontinued, Historical Med    telmisartan (MICARDIS) 80 MG tablet Take 80 mg by mouth daily.  , Until Discontinued, Historical Med      STOP taking these medications     aspirin EC 81 MG tablet      insulin lispro (HUMALOG) 100 UNIT/ML injection      sitaGLIPtin (JANUVIA) 100 MG tablet      Ticagrelor (BRILINTA) 90 MG TABS tablet          Signed: Orion Mole 01/07/2012, 10:47 AM

## 2012-01-10 ENCOUNTER — Encounter (HOSPITAL_COMMUNITY)
Admission: RE | Admit: 2012-01-10 | Discharge: 2012-01-10 | Disposition: A | Discharge status: Home / Self Care | Payer: Medicare Other | Source: Ambulatory Visit | Attending: Neurosurgery | Admitting: Neurosurgery

## 2012-01-10 ENCOUNTER — Encounter (HOSPITAL_COMMUNITY): Payer: Self-pay

## 2012-01-10 HISTORY — DX: Shortness of breath: R06.02

## 2012-01-10 HISTORY — DX: Encounter for other specified aftercare: Z51.89

## 2012-01-10 HISTORY — DX: Adverse effect of unspecified anesthetic, initial encounter: T41.45XA

## 2012-01-10 HISTORY — DX: Essential (primary) hypertension: I10

## 2012-01-10 HISTORY — DX: Sleep apnea, unspecified: G47.30

## 2012-01-10 HISTORY — DX: Unspecified osteoarthritis, unspecified site: M19.90

## 2012-01-10 HISTORY — DX: Angina pectoris, unspecified: I20.9

## 2012-01-10 HISTORY — DX: Chronic kidney disease, unspecified: N18.9

## 2012-01-10 HISTORY — DX: Peripheral vascular angioplasty status: Z98.62

## 2012-01-10 HISTORY — DX: Other complications of anesthesia, initial encounter: T88.59XA

## 2012-01-10 HISTORY — DX: Gastrointestinal hemorrhage, unspecified: K92.2

## 2012-01-10 HISTORY — DX: Anemia, unspecified: D64.9

## 2012-01-10 LAB — CBC
MCH: 27.1 pg (ref 26.0–34.0)
MCHC: 32.7 g/dL (ref 30.0–36.0)
MCV: 82.8 fL (ref 78.0–100.0)
Platelets: 270 10*3/uL (ref 150–400)
RBC: 4.13 MIL/uL (ref 3.87–5.11)

## 2012-01-10 LAB — BASIC METABOLIC PANEL
BUN: 10 mg/dL (ref 6–23)
CO2: 24 mEq/L (ref 19–32)
Calcium: 9.7 mg/dL (ref 8.4–10.5)
Creatinine, Ser: 1.1 mg/dL (ref 0.50–1.10)
GFR calc non Af Amer: 49 mL/min — ABNORMAL LOW (ref >90)
Glucose, Bld: 146 mg/dL — ABNORMAL HIGH (ref 70–99)
Sodium: 142 mEq/L (ref 135–145)

## 2012-01-10 LAB — SURGICAL PCR SCREEN: MRSA, PCR: NEGATIVE

## 2012-01-10 NOTE — Progress Notes (Signed)
EKG 11/12 CXR 9/12 CATH 10/12 ECHO 9/07 IN EPIC

## 2012-01-10 NOTE — Pre-Procedure Instructions (Addendum)
20 MAYLINE DRAGON  01/10/2012   Your procedure is scheduled on: 01/15/12  Report to [facility] at 715AM.  Call this number if you have problems the morning of surgery: [phone]   Remember:   Do not eat food:After Midnight.  May have clear liquids: up to 4 Hours before arrival.  Clear liquids include soda, tea, black coffee, apple or grape juice, broth.  Take these medicines the morning of surgery with A SIP OF WATER: albuterol, use eye drops if needed,  toprol, prednisone,micardis, PAIN MED               STOP methotrexate any asa nsaids herbal meds or blood thinners   Do not wear jewelry, make-up or nail polish.  Do not wear lotions, powders, or perfumes. You may wear deodorant.  Do not shave 48 hours prior to surgery.  Do not bring valuables to the hospital.  Contacts, dentures or bridgework may not be worn into surgery.  Leave suitcase in the car. After surgery it may be brought to your room.  For patients admitted to the hospital, checkout time is 11:00 AM the day of discharge.   Patients discharged the day of surgery will not be allowed to drive home.  Name and phone number of your driver: Venida Jarvis [phone]  Special Instructions: CHG Shower Use Special Wash: 1/2 bottle night before surgery and 1/2 bottle morning of surgery.   Please read over the following fact sheets that you were given: Pain Booklet, Coughing and Deep Breathing, Blood Transfusion Information, MRSA Information and Surgical Site Infection Prevention

## 2012-01-14 MED ORDER — CEFAZOLIN SODIUM 1-5 GM-% IV SOLN
1.0000 g | INTRAVENOUS | Status: AC
  Administered 2012-01-15 (×2): 1 g via INTRAVENOUS
  Filled 2012-01-14: qty 50

## 2012-01-15 ENCOUNTER — Inpatient Hospital Stay (HOSPITAL_COMMUNITY): Payer: Medicare Other

## 2012-01-15 ENCOUNTER — Encounter (HOSPITAL_COMMUNITY): Payer: Self-pay | Admitting: *Deleted

## 2012-01-15 ENCOUNTER — Inpatient Hospital Stay (HOSPITAL_COMMUNITY): Payer: Medicare Other | Admitting: Anesthesiology

## 2012-01-15 ENCOUNTER — Encounter (HOSPITAL_COMMUNITY)
Admission: RE | Disposition: A | Discharge status: Home / Self Care | Payer: Self-pay | Source: Ambulatory Visit | Attending: Neurosurgery

## 2012-01-15 ENCOUNTER — Encounter (HOSPITAL_COMMUNITY): Payer: Self-pay | Admitting: Anesthesiology

## 2012-01-15 ENCOUNTER — Inpatient Hospital Stay (HOSPITAL_COMMUNITY)
Admission: RE | Admit: 2012-01-15 | Discharge: 2012-01-17 | DRG: 460 | Disposition: A | Discharge status: Home / Self Care | Payer: Medicare Other | Source: Ambulatory Visit | Attending: Neurosurgery | Admitting: Neurosurgery

## 2012-01-15 DIAGNOSIS — M5137 Other intervertebral disc degeneration, lumbosacral region: Secondary | ICD-10-CM | POA: Diagnosis present

## 2012-01-15 DIAGNOSIS — E119 Type 2 diabetes mellitus without complications: Secondary | ICD-10-CM | POA: Diagnosis present

## 2012-01-15 DIAGNOSIS — IMO0002 Reserved for concepts with insufficient information to code with codable children: Secondary | ICD-10-CM

## 2012-01-15 DIAGNOSIS — M47817 Spondylosis without myelopathy or radiculopathy, lumbosacral region: Principal | ICD-10-CM | POA: Diagnosis present

## 2012-01-15 DIAGNOSIS — J45909 Unspecified asthma, uncomplicated: Secondary | ICD-10-CM | POA: Diagnosis present

## 2012-01-15 DIAGNOSIS — I10 Essential (primary) hypertension: Secondary | ICD-10-CM | POA: Diagnosis present

## 2012-01-15 DIAGNOSIS — M51379 Other intervertebral disc degeneration, lumbosacral region without mention of lumbar back pain or lower extremity pain: Secondary | ICD-10-CM | POA: Diagnosis present

## 2012-01-15 DIAGNOSIS — Z23 Encounter for immunization: Secondary | ICD-10-CM

## 2012-01-15 DIAGNOSIS — Z79899 Other long term (current) drug therapy: Secondary | ICD-10-CM

## 2012-01-15 DIAGNOSIS — J329 Chronic sinusitis, unspecified: Secondary | ICD-10-CM | POA: Diagnosis present

## 2012-01-15 DIAGNOSIS — G4733 Obstructive sleep apnea (adult) (pediatric): Secondary | ICD-10-CM | POA: Diagnosis present

## 2012-01-15 DIAGNOSIS — M4310 Spondylolisthesis, site unspecified: Secondary | ICD-10-CM | POA: Diagnosis present

## 2012-01-15 DIAGNOSIS — E785 Hyperlipidemia, unspecified: Secondary | ICD-10-CM | POA: Diagnosis present

## 2012-01-15 LAB — TYPE AND SCREEN
ABO/RH(D): O POS
Antibody Screen: NEGATIVE

## 2012-01-15 LAB — GLUCOSE, CAPILLARY
Glucose-Capillary: 135 mg/dL — ABNORMAL HIGH (ref 70–99)
Glucose-Capillary: 156 mg/dL — ABNORMAL HIGH (ref 70–99)

## 2012-01-15 SURGERY — POSTERIOR LUMBAR FUSION 1 LEVEL
Anesthesia: General | Site: Back | Wound class: Clean

## 2012-01-15 MED ORDER — HYDRALAZINE HCL 20 MG/ML IJ SOLN
10.0000 mg | Freq: Once | INTRAMUSCULAR | Status: AC
  Administered 2012-01-15: 5 mg via INTRAVENOUS

## 2012-01-15 MED ORDER — LIDOCAINE-EPINEPHRINE 1 %-1:100000 IJ SOLN
INTRAMUSCULAR | Status: DC | PRN
  Administered 2012-01-15: 20 mL

## 2012-01-15 MED ORDER — DIPHENHYDRAMINE HCL 50 MG/ML IJ SOLN: 12.5000 mg | Freq: Four times a day (QID) | INTRAMUSCULAR | Status: DC | PRN

## 2012-01-15 MED ORDER — THROMBIN 20000 UNITS EX KIT
PACK | OROMUCOSAL | Status: DC | PRN
  Administered 2012-01-15: 11:00:00 via TOPICAL

## 2012-01-15 MED ORDER — SODIUM CHLORIDE 0.9 % IV SOLN
INTRAVENOUS | Status: AC
  Filled 2012-01-15: qty 500

## 2012-01-15 MED ORDER — PROPOFOL 10 MG/ML IV EMUL
INTRAVENOUS | Status: DC | PRN
  Administered 2012-01-15: 200 mg via INTRAVENOUS

## 2012-01-15 MED ORDER — MAGNESIUM HYDROXIDE 400 MG/5ML PO SUSP: 30.0000 mL | Freq: Every day | ORAL | Status: DC | PRN

## 2012-01-15 MED ORDER — METOPROLOL SUCCINATE ER 25 MG PO TB24
25.0000 mg | ORAL_TABLET | Freq: Every day | ORAL | Status: DC
  Administered 2012-01-15 – 2012-01-17 (×3): 25 mg via ORAL
  Filled 2012-01-15 (×3): qty 1

## 2012-01-15 MED ORDER — HYDROXYZINE HCL 50 MG/ML IM SOLN
50.0000 mg | INTRAMUSCULAR | Status: DC | PRN
  Filled 2012-01-15: qty 1

## 2012-01-15 MED ORDER — LIDOCAINE HCL (CARDIAC) 20 MG/ML IV SOLN
INTRAVENOUS | Status: DC | PRN
  Administered 2012-01-15: 50 mg via INTRAVENOUS

## 2012-01-15 MED ORDER — BIMATOPROST 0.01 % OP SOLN
1.0000 [drp] | Freq: Every day | OPHTHALMIC | Status: DC
  Filled 2012-01-15: qty 5

## 2012-01-15 MED ORDER — HYDROXYZINE HCL 50 MG PO TABS
50.0000 mg | ORAL_TABLET | ORAL | Status: DC | PRN
  Filled 2012-01-15: qty 1

## 2012-01-15 MED ORDER — ACETAMINOPHEN 325 MG PO TABS: 650.0000 mg | ORAL_TABLET | ORAL | Status: DC | PRN

## 2012-01-15 MED ORDER — BUPIVACAINE HCL (PF) 0.5 % IJ SOLN
INTRAMUSCULAR | Status: DC | PRN
  Administered 2012-01-15: 20 mL

## 2012-01-15 MED ORDER — SIMVASTATIN 40 MG PO TABS
40.0000 mg | ORAL_TABLET | Freq: Every day | ORAL | Status: DC
  Administered 2012-01-15 – 2012-01-16 (×2): 40 mg via ORAL
  Filled 2012-01-15 (×3): qty 1

## 2012-01-15 MED ORDER — SODIUM CHLORIDE 0.9 % IV SOLN: 250.0000 mL | INTRAVENOUS | Status: DC

## 2012-01-15 MED ORDER — HETASTARCH-ELECTROLYTES 6 % IV SOLN
INTRAVENOUS | Status: DC | PRN
  Administered 2012-01-15: 12:00:00 via INTRAVENOUS

## 2012-01-15 MED ORDER — NEOSTIGMINE METHYLSULFATE 1 MG/ML IJ SOLN
INTRAMUSCULAR | Status: DC | PRN
  Administered 2012-01-15: 4 mg via INTRAVENOUS

## 2012-01-15 MED ORDER — MIDAZOLAM HCL 5 MG/5ML IJ SOLN
INTRAMUSCULAR | Status: DC | PRN
  Administered 2012-01-15: 2 mg via INTRAVENOUS

## 2012-01-15 MED ORDER — KETOROLAC TROMETHAMINE 30 MG/ML IJ SOLN
15.0000 mg | Freq: Once | INTRAMUSCULAR | Status: AC
  Administered 2012-01-15: 15 mg via INTRAVENOUS

## 2012-01-15 MED ORDER — ACETAMINOPHEN 650 MG RE SUPP: 650.0000 mg | RECTAL | Status: DC | PRN

## 2012-01-15 MED ORDER — FOLIC ACID 1 MG PO TABS
1.0000 mg | ORAL_TABLET | Freq: Every day | ORAL | Status: DC
  Administered 2012-01-15 – 2012-01-17 (×3): 1 mg via ORAL
  Filled 2012-01-15 (×3): qty 1

## 2012-01-15 MED ORDER — ONDANSETRON HCL 4 MG/2ML IJ SOLN
INTRAMUSCULAR | Status: DC | PRN
  Administered 2012-01-15: 4 mg via INTRAVENOUS

## 2012-01-15 MED ORDER — METOPROLOL SUCCINATE ER 25 MG PO TB24
25.0000 mg | ORAL_TABLET | ORAL | Status: AC
  Administered 2012-01-15: 25 mg via ORAL
  Filled 2012-01-15: qty 1

## 2012-01-15 MED ORDER — OXYCODONE-ACETAMINOPHEN 5-325 MG PO TABS
1.0000 | ORAL_TABLET | ORAL | Status: DC | PRN
  Administered 2012-01-16: 2 via ORAL
  Administered 2012-01-16: 1 via ORAL
  Administered 2012-01-17 (×2): 2 via ORAL
  Filled 2012-01-15: qty 2
  Filled 2012-01-15: qty 1
  Filled 2012-01-15 (×2): qty 2

## 2012-01-15 MED ORDER — ONDANSETRON HCL 4 MG/2ML IJ SOLN: 4.0000 mg | Freq: Four times a day (QID) | INTRAMUSCULAR | Status: DC | PRN

## 2012-01-15 MED ORDER — BESIFLOXACIN HCL 0.6 % OP SUSP: 1.0000 [drp] | Freq: Two times a day (BID) | OPHTHALMIC | Status: DC

## 2012-01-15 MED ORDER — MORPHINE SULFATE (PF) 1 MG/ML IV SOLN
INTRAVENOUS | Status: DC
  Administered 2012-01-16: 1 mg via INTRAVENOUS

## 2012-01-15 MED ORDER — OLMESARTAN MEDOXOMIL 40 MG PO TABS
40.0000 mg | ORAL_TABLET | Freq: Every day | ORAL | Status: DC
  Administered 2012-01-15 – 2012-01-17 (×3): 40 mg via ORAL
  Filled 2012-01-15 (×3): qty 1

## 2012-01-15 MED ORDER — ROCURONIUM BROMIDE 100 MG/10ML IV SOLN
INTRAVENOUS | Status: DC | PRN
  Administered 2012-01-15: 50 mg via INTRAVENOUS

## 2012-01-15 MED ORDER — CEFAZOLIN SODIUM 1-5 GM-% IV SOLN
INTRAVENOUS | Status: AC
  Filled 2012-01-15: qty 50

## 2012-01-15 MED ORDER — MORPHINE SULFATE (PF) 1 MG/ML IV SOLN
INTRAVENOUS | Status: DC
  Administered 2012-01-15: 14:00:00 via INTRAVENOUS
  Filled 2012-01-15: qty 25

## 2012-01-15 MED ORDER — WHITE PETROLATUM GEL
Status: AC
  Administered 2012-01-15: 17:00:00
  Filled 2012-01-15: qty 5

## 2012-01-15 MED ORDER — CYCLOBENZAPRINE HCL 10 MG PO TABS: 10.0000 mg | ORAL_TABLET | Freq: Three times a day (TID) | ORAL | Status: DC | PRN

## 2012-01-15 MED ORDER — SODIUM CHLORIDE 0.9 % IJ SOLN: 9.0000 mL | INTRAMUSCULAR | Status: DC | PRN

## 2012-01-15 MED ORDER — BRIMONIDINE TARTRATE 0.2 % OP SOLN
1.0000 [drp] | Freq: Two times a day (BID) | OPHTHALMIC | Status: DC
  Administered 2012-01-15 – 2012-01-17 (×4): 1 [drp] via OPHTHALMIC
  Filled 2012-01-15: qty 5

## 2012-01-15 MED ORDER — MENTHOL 3 MG MT LOZG
1.0000 | LOZENGE | OROMUCOSAL | Status: DC | PRN
  Filled 2012-01-15: qty 9

## 2012-01-15 MED ORDER — HYDROMORPHONE HCL PF 1 MG/ML IJ SOLN
0.2500 mg | INTRAMUSCULAR | Status: DC | PRN
  Administered 2012-01-15 (×2): 0.5 mg via INTRAVENOUS

## 2012-01-15 MED ORDER — SODIUM CHLORIDE 0.9 % IR SOLN
Status: DC | PRN
  Administered 2012-01-15: 11:00:00

## 2012-01-15 MED ORDER — EPHEDRINE SULFATE 50 MG/ML IJ SOLN
INTRAMUSCULAR | Status: DC | PRN
  Administered 2012-01-15 (×2): 5 mg via INTRAVENOUS

## 2012-01-15 MED ORDER — ALBUTEROL SULFATE HFA 108 (90 BASE) MCG/ACT IN AERS
2.0000 | INHALATION_SPRAY | Freq: Four times a day (QID) | RESPIRATORY_TRACT | Status: DC | PRN
  Filled 2012-01-15: qty 6.7

## 2012-01-15 MED ORDER — BRIMONIDINE TARTRATE 0.15 % OP SOLN
1.0000 [drp] | Freq: Two times a day (BID) | OPHTHALMIC | Status: DC
  Filled 2012-01-15: qty 5

## 2012-01-15 MED ORDER — HYDROCODONE-ACETAMINOPHEN 5-325 MG PO TABS: 1.0000 | ORAL_TABLET | ORAL | Status: DC | PRN

## 2012-01-15 MED ORDER — KETOROLAC TROMETHAMINE 0.5 % OP SOLN
1.0000 [drp] | Freq: Every day | OPHTHALMIC | Status: DC
  Administered 2012-01-16 – 2012-01-17 (×2): 1 [drp] via OPHTHALMIC
  Filled 2012-01-15: qty 5
  Filled 2012-01-15: qty 3

## 2012-01-15 MED ORDER — ZOLPIDEM TARTRATE 5 MG PO TABS: 5.0000 mg | ORAL_TABLET | Freq: Every evening | ORAL | Status: DC | PRN

## 2012-01-15 MED ORDER — KETOROLAC TROMETHAMINE 30 MG/ML IJ SOLN
INTRAMUSCULAR | Status: AC
  Filled 2012-01-15: qty 1

## 2012-01-15 MED ORDER — 0.9 % SODIUM CHLORIDE (POUR BTL) OPTIME
TOPICAL | Status: DC | PRN
  Administered 2012-01-15: 1000 mL

## 2012-01-15 MED ORDER — SODIUM CHLORIDE 0.9 % IJ SOLN
3.0000 mL | Freq: Two times a day (BID) | INTRAMUSCULAR | Status: DC
  Administered 2012-01-16: 3 mL via INTRAVENOUS

## 2012-01-15 MED ORDER — MORPHINE SULFATE 4 MG/ML IJ SOLN: 4.0000 mg | INTRAMUSCULAR | Status: DC | PRN

## 2012-01-15 MED ORDER — DIPHENHYDRAMINE HCL 12.5 MG/5ML PO ELIX: 12.5000 mg | ORAL_SOLUTION | Freq: Four times a day (QID) | ORAL | Status: DC | PRN

## 2012-01-15 MED ORDER — NITROGLYCERIN 0.4 MG SL SUBL: 0.4000 mg | SUBLINGUAL_TABLET | SUBLINGUAL | Status: DC | PRN

## 2012-01-15 MED ORDER — PHENOL 1.4 % MT LIQD: 1.0000 | OROMUCOSAL | Status: DC | PRN

## 2012-01-15 MED ORDER — GATIFLOXACIN 0.5 % OP SOLN
1.0000 [drp] | Freq: Two times a day (BID) | OPHTHALMIC | Status: DC
  Administered 2012-01-15 – 2012-01-17 (×4): 1 [drp] via OPHTHALMIC
  Filled 2012-01-15: qty 2.5

## 2012-01-15 MED ORDER — DONEPEZIL HCL 10 MG PO TABS
10.0000 mg | ORAL_TABLET | Freq: Every day | ORAL | Status: DC
  Administered 2012-01-15 – 2012-01-16 (×2): 10 mg via ORAL
  Filled 2012-01-15 (×3): qty 1

## 2012-01-15 MED ORDER — BIMATOPROST 0.03 % OP SOLN
1.0000 [drp] | Freq: Every day | OPHTHALMIC | Status: DC
  Filled 2012-01-15: qty 2.5

## 2012-01-15 MED ORDER — VECURONIUM BROMIDE 10 MG IV SOLR
INTRAVENOUS | Status: DC | PRN
  Administered 2012-01-15 (×2): 2 mg via INTRAVENOUS

## 2012-01-15 MED ORDER — BISACODYL 10 MG RE SUPP: 10.0000 mg | Freq: Every day | RECTAL | Status: DC | PRN

## 2012-01-15 MED ORDER — PREDNISONE 5 MG PO TABS
5.0000 mg | ORAL_TABLET | Freq: Every day | ORAL | Status: DC
  Administered 2012-01-15 – 2012-01-17 (×3): 5 mg via ORAL
  Filled 2012-01-15 (×3): qty 1

## 2012-01-15 MED ORDER — NALOXONE HCL 0.4 MG/ML IJ SOLN: 0.4000 mg | INTRAMUSCULAR | Status: DC | PRN

## 2012-01-15 MED ORDER — SODIUM CHLORIDE 0.9 % IJ SOLN: 3.0000 mL | INTRAMUSCULAR | Status: DC | PRN

## 2012-01-15 MED ORDER — POTASSIUM CHLORIDE IN NACL 20-0.45 MEQ/L-% IV SOLN
INTRAVENOUS | Status: DC
  Administered 2012-01-15 – 2012-01-16 (×2): via INTRAVENOUS
  Filled 2012-01-15 (×7): qty 1000

## 2012-01-15 MED ORDER — BACITRACIN 50000 UNITS IM SOLR
INTRAMUSCULAR | Status: AC
  Filled 2012-01-15: qty 1

## 2012-01-15 MED ORDER — LIDOCAINE HCL 4 % MT SOLN
OROMUCOSAL | Status: DC | PRN
  Administered 2012-01-15: 4 mL via TOPICAL

## 2012-01-15 MED ORDER — DEXAMETHASONE SODIUM PHOSPHATE 4 MG/ML IJ SOLN
INTRAMUSCULAR | Status: DC | PRN
  Administered 2012-01-15: 4 mg via INTRAVENOUS

## 2012-01-15 MED ORDER — ONDANSETRON HCL 4 MG/2ML IJ SOLN: 4.0000 mg | Freq: Once | INTRAMUSCULAR | Status: DC | PRN

## 2012-01-15 MED ORDER — GLYCOPYRROLATE 0.2 MG/ML IJ SOLN
INTRAMUSCULAR | Status: DC | PRN
  Administered 2012-01-15: .6 mg via INTRAVENOUS

## 2012-01-15 MED ORDER — LACTATED RINGERS IV SOLN
INTRAVENOUS | Status: DC | PRN
  Administered 2012-01-15 (×2): via INTRAVENOUS

## 2012-01-15 MED ORDER — KETOROLAC TROMETHAMINE 30 MG/ML IJ SOLN
15.0000 mg | Freq: Four times a day (QID) | INTRAMUSCULAR | Status: DC
  Administered 2012-01-15 – 2012-01-16 (×4): 15 mg via INTRAVENOUS
  Filled 2012-01-15 (×9): qty 1

## 2012-01-15 MED ORDER — LABETALOL HCL 5 MG/ML IV SOLN
INTRAVENOUS | Status: DC | PRN
  Administered 2012-01-15 (×4): 5 mg via INTRAVENOUS

## 2012-01-15 MED ORDER — HYDROMORPHONE HCL PF 1 MG/ML IJ SOLN
INTRAMUSCULAR | Status: AC
  Filled 2012-01-15: qty 1

## 2012-01-15 MED ORDER — BIMATOPROST 0.01 % OP SOLN
1.0000 [drp] | Freq: Every day | OPHTHALMIC | Status: DC
  Administered 2012-01-15 – 2012-01-16 (×2): 1 [drp] via OPHTHALMIC

## 2012-01-15 MED ORDER — ALUM & MAG HYDROXIDE-SIMETH 200-200-20 MG/5ML PO SUSP: 30.0000 mL | Freq: Four times a day (QID) | ORAL | Status: DC | PRN

## 2012-01-15 MED ORDER — HYDRALAZINE HCL 20 MG/ML IJ SOLN
5.0000 mg | Freq: Once | INTRAMUSCULAR | Status: AC
  Administered 2012-01-15: 5 mg via INTRAVENOUS

## 2012-01-15 MED ORDER — PREDNISOLONE ACETATE 1 % OP SUSP
1.0000 [drp] | Freq: Two times a day (BID) | OPHTHALMIC | Status: DC
  Administered 2012-01-15 – 2012-01-17 (×4): 1 [drp] via OPHTHALMIC
  Filled 2012-01-15: qty 1

## 2012-01-15 MED ORDER — FENTANYL CITRATE 0.05 MG/ML IJ SOLN
INTRAMUSCULAR | Status: DC | PRN
  Administered 2012-01-15: 150 ug via INTRAVENOUS
  Administered 2012-01-15 (×3): 50 ug via INTRAVENOUS

## 2012-01-15 MED ORDER — BROMFENAC SODIUM 0.09 % OP SOLN: 1.0000 [drp] | OPHTHALMIC | Status: DC

## 2012-01-15 SURGICAL SUPPLY — 73 items
ADH SKN CLS APL DERMABOND .7 (GAUZE/BANDAGES/DRESSINGS) ×1
BAG DECANTER FOR FLEXI CONT (MISCELLANEOUS) ×2 IMPLANT
BLADE SURG ROTATE 9660 (MISCELLANEOUS) IMPLANT
BRUSH SCRUB EZ PLAIN DRY (MISCELLANEOUS) ×2 IMPLANT
BUR ACRON 5.0MM COATED (BURR) ×2 IMPLANT
BUR MATCHSTICK NEURO 3.0 LAGG (BURR) ×2 IMPLANT
CANISTER SUCTION 2500CC (MISCELLANEOUS) ×2 IMPLANT
CAP LCK SPNE (Orthopedic Implant) ×4 IMPLANT
CAP LOCK SPINE RADIUS (Orthopedic Implant) IMPLANT
CAP LOCKING (Orthopedic Implant) ×8 IMPLANT
CLOTH BEACON ORANGE TIMEOUT ST (SAFETY) ×2 IMPLANT
CONT SPEC 4OZ CLIKSEAL STRL BL (MISCELLANEOUS) ×3 IMPLANT
COVER BACK TABLE 24X17X13 BIG (DRAPES) IMPLANT
COVER TABLE BACK 60X90 (DRAPES) ×2 IMPLANT
DERMABOND ADVANCED (GAUZE/BANDAGES/DRESSINGS) ×1
DERMABOND ADVANCED .7 DNX12 (GAUZE/BANDAGES/DRESSINGS) ×2 IMPLANT
DRAPE C-ARM 42X72 X-RAY (DRAPES) ×4 IMPLANT
DRAPE LAPAROTOMY 100X72X124 (DRAPES) ×2 IMPLANT
DRAPE POUCH INSTRU U-SHP 10X18 (DRAPES) ×2 IMPLANT
DRAPE PROXIMA HALF (DRAPES) IMPLANT
DRSG EMULSION OIL 3X3 NADH (GAUZE/BANDAGES/DRESSINGS) IMPLANT
ELECT REM PT RETURN 9FT ADLT (ELECTROSURGICAL) ×2
ELECTRODE REM PT RTRN 9FT ADLT (ELECTROSURGICAL) ×1 IMPLANT
GAUZE SPONGE 4X4 16PLY XRAY LF (GAUZE/BANDAGES/DRESSINGS) IMPLANT
GLOVE BIOGEL PI IND STRL 7.5 (GLOVE) IMPLANT
GLOVE BIOGEL PI IND STRL 8 (GLOVE) ×2 IMPLANT
GLOVE BIOGEL PI INDICATOR 7.5 (GLOVE) ×1
GLOVE BIOGEL PI INDICATOR 8 (GLOVE) ×3
GLOVE ECLIPSE 7.5 STRL STRAW (GLOVE) ×7 IMPLANT
GLOVE EXAM NITRILE LRG STRL (GLOVE) IMPLANT
GLOVE EXAM NITRILE MD LF STRL (GLOVE) IMPLANT
GLOVE EXAM NITRILE XL STR (GLOVE) IMPLANT
GLOVE EXAM NITRILE XS STR PU (GLOVE) IMPLANT
GLOVE SURG SS PI 8.0 STRL IVOR (GLOVE) ×3 IMPLANT
GOWN BRE IMP SLV AUR LG STRL (GOWN DISPOSABLE) ×2 IMPLANT
GOWN BRE IMP SLV AUR XL STRL (GOWN DISPOSABLE) ×4 IMPLANT
GOWN STRL REIN 2XL LVL4 (GOWN DISPOSABLE) ×2 IMPLANT
IMBIBE NEEDLE ×1 IMPLANT
KIT BASIN OR (CUSTOM PROCEDURE TRAY) ×2 IMPLANT
KIT INFUSE SMALL (Orthopedic Implant) ×1 IMPLANT
KIT ROOM TURNOVER OR (KITS) ×2 IMPLANT
MILL MEDIUM DISP (BLADE) ×1 IMPLANT
NDL HYPO 25X1 1.5 SAFETY (NEEDLE) ×1 IMPLANT
NDL SPNL 18GX3.5 QUINCKE PK (NEEDLE) IMPLANT
NDL SPNL 22GX3.5 QUINCKE BK (NEEDLE) ×1 IMPLANT
NEEDLE BONE MARROW 8GAX6 (NEEDLE) ×1 IMPLANT
NEEDLE HYPO 25X1 1.5 SAFETY (NEEDLE) ×2 IMPLANT
NEEDLE SPNL 18GX3.5 QUINCKE PK (NEEDLE) IMPLANT
NEEDLE SPNL 22GX3.5 QUINCKE BK (NEEDLE) ×2 IMPLANT
NS IRRIG 1000ML POUR BTL (IV SOLUTION) ×2 IMPLANT
PACK LAMINECTOMY NEURO (CUSTOM PROCEDURE TRAY) ×2 IMPLANT
PAD ARMBOARD 7.5X6 YLW CONV (MISCELLANEOUS) ×6 IMPLANT
PATTIES SURGICAL .5 X.5 (GAUZE/BANDAGES/DRESSINGS) IMPLANT
PATTIES SURGICAL .5 X1 (DISPOSABLE) ×1 IMPLANT
PATTIES SURGICAL 1X1 (DISPOSABLE) IMPLANT
PEEK PLIF AVS 10X25X4 (Peek) ×2 IMPLANT
ROD RADIUS 35MM (Rod) ×2 IMPLANT
SCREW 5.75X40M (Screw) ×2 IMPLANT
SCREW 5.75X45MM (Screw) ×2 IMPLANT
SPONGE GAUZE 4X4 12PLY (GAUZE/BANDAGES/DRESSINGS) ×2 IMPLANT
SPONGE LAP 4X18 X RAY DECT (DISPOSABLE) IMPLANT
SPONGE NEURO XRAY DETECT 1X3 (DISPOSABLE) IMPLANT
SPONGE SURGIFOAM ABS GEL 100 (HEMOSTASIS) ×2 IMPLANT
STRIP VITOSS 25X100X4MM (Neuro Prosthesis/Implant) ×1 IMPLANT
SUT VIC AB 1 CT1 18XBRD ANBCTR (SUTURE) ×2 IMPLANT
SUT VIC AB 1 CT1 8-18 (SUTURE) ×2
SUT VIC AB 2-0 CP2 18 (SUTURE) ×3 IMPLANT
SYR 20ML ECCENTRIC (SYRINGE) ×2 IMPLANT
SYR CONTROL 10ML LL (SYRINGE) ×1 IMPLANT
TOWEL OR 17X24 6PK STRL BLUE (TOWEL DISPOSABLE) ×2 IMPLANT
TOWEL OR 17X26 10 PK STRL BLUE (TOWEL DISPOSABLE) ×2 IMPLANT
TRAY FOLEY CATH 14FRSI W/METER (CATHETERS) ×2 IMPLANT
WATER STERILE IRR 1000ML POUR (IV SOLUTION) ×2 IMPLANT

## 2012-01-15 NOTE — Preoperative (Signed)
Beta Blockers   Reason not to administer Beta Blockers:Not Applicable 

## 2012-01-15 NOTE — H&P (Signed)
Subjective: Patient is a 76 y.o. female who is admitted for treatment of low back and left lower joint pain secondary to lumbar degenerative disc disease, lumbar spondylosis, lumbar stenosis, and the dynamic degenerative spondylolisthesis of L4 on L5. Patient's been treated with epidural steroid injections and end-stage without relief. X-rays show a grade 1-2 spondylolisthesis of L4 and 5 secondary to hypertrophic facet arthropathy bilaterally at L4-5. The spondylolisthesis increases with being upright and diminishes with being supine we also see increased with flexion and decreased with extension. MRI shows spondylosis throughout the lumbar spine with associated degenerative disc disease, spinal listhesis of L4 and 5, and multifactorial lumbar stenosis at L4-5.  The patient is admitted now for a L4-5 decompression, posterior lumbar interbody fusion and posterior lateral arthrodesis.   Patient Active Problem List  Diagnoses Date Noted  . ALLERGIC RHINITIS 02/21/2010  . DIABETES MELLITUS, TYPE II 02/15/2010  . HYPERLIPIDEMIA 02/15/2010  . OBSTRUCTIVE SLEEP APNEA 02/15/2010  . HYPERTENSION 02/15/2010  . RHINOSINUSITIS, CHRONIC 02/15/2010  . ASTHMA 02/15/2010   Past Medical History  Diagnosis Date  . Complication of anesthesia     OCC HALLUCINATIONS  . S/P angioplasty     10/12  . Angina     INFREG  . Asthma     NO PROBLEMS RECENTLY  . Shortness of breath   . Sleep apnea     06  . GI bleed     12  4 UNITS OF BLOOD  . Blood transfusion     LATE 12 4 UNITS FOR GI BLEED  . Chronic kidney disease     STONES  . Hypertension   . Arthritis   . Diabetes mellitus     OFF JANUVIA AND HUMALOG SINCE NOV  . Anemia   . Glaucoma     Past Surgical History  Procedure Date  . Coronary angioplasty   . Abdominal hysterectomy   . Hip arthroplasty     BIL  . Knee arthroplasty     LFT  . Eye surgery     CAT IOL LFT    Prescriptions prior to admission  Medication Sig Dispense Refill  .  albuterol (PROVENTIL HFA;VENTOLIN HFA) 108 (90 BASE) MCG/ACT inhaler Inhale 2 puffs into the lungs every 6 (six) hours as needed. Shortness of breath       . Besifloxacin HCl 0.6 % SUSP Place 1 drop into the left eye 2 (two) times daily.        . bimatoprost (LUMIGAN) 0.03 % ophthalmic solution Place 1 drop into both eyes at bedtime.        . brimonidine (ALPHAGAN) 0.15 % ophthalmic solution Place 1 drop into both eyes 2 (two) times daily.        . bromfenac (XIBROM) 0.09 % ophthalmic solution Place 1 drop into the left eye every morning.        . donepezil (ARICEPT) 10 MG tablet Take 10 mg by mouth at bedtime as needed.        . folic acid (FOLVITE) 1 MG tablet Take 1 mg by mouth daily.        Marland Kitchen METHOTREXATE SODIUM LPF IJ Inject 25 mg/day as directed 7 days. On Monday.      . metoprolol succinate (TOPROL-XL) 25 MG 24 hr tablet Take 25 mg by mouth daily.        . nitroGLYCERIN (NITROSTAT) 0.4 MG SL tablet Place 0.4 mg under the tongue every 5 (five) minutes as needed. Chest pain       .  prednisoLONE acetate (PRED FORTE) 1 % ophthalmic suspension Place 1 drop into the left eye 2 (two) times daily.        . predniSONE (DELTASONE) 5 MG tablet Take 5 mg by mouth daily.        . simvastatin (ZOCOR) 40 MG tablet Take 40 mg by mouth at bedtime.        Marland Kitchen telmisartan (MICARDIS) 80 MG tablet Take 80 mg by mouth daily.         Allergies  Allergen Reactions  . Codeine     REACTION: nausea, GI upset    History  Substance Use Topics  . Smoking status: Never Smoker   . Smokeless tobacco: Not on file  . Alcohol Use: No    No family history on file.   Review of Systems A comprehensive review of systems was negative.  Objective: Vital signs in last 24 hours: Temp:  [97.9 F (36.6 C)] 97.9 F (36.6 C) (01/21 0717) Pulse Rate:  [80] 80  (01/21 0717) Resp:  [20] 20  (01/21 0717) BP: (161)/(91) 161/91 mmHg (01/21 0717) SpO2:  [96 %] 96 % (01/21 0717)  EXAM:  Lungs are clear to auscultation , the  patient has symmetrical respiratory excursion. Heart has a regular rate and rhythm normal S1 and S2 no murmur.   Abdomen is soft nontender nondistended bowel sounds are present. Extremity examination shows no clubbing cyanosis or edema. Musculoskeletal examination shows no tenderness to palpation over the lumbar spinous processes or paralumbar musculature. She is able to flex to 90 and able to extend to 10.  Motor examination shows 5 over 5 strength in the lower extremities including the iliopsoas quadriceps dorsiflexor extensor hallicus  longus and plantar flexor bilaterally. Sensation is intact to pinprick in the distal lower extremities. Reflexes are symmetrical bilaterally. No pathologic reflexes are present. Patient has a normal gait and stance.   Data Review:CBC    Component Value Date/Time   WBC 9.2 01/10/2012 1346   RBC 4.13 01/10/2012 1346   HGB 11.2* 01/10/2012 1346   HCT 34.2* 01/10/2012 1346   PLT 270 01/10/2012 1346   MCV 82.8 01/10/2012 1346   MCH 27.1 01/10/2012 1346   MCHC 32.7 01/10/2012 1346   RDW 13.9 01/10/2012 1346   LYMPHSABS 2.0 10/30/2011 2212   MONOABS 0.7 10/30/2011 2212   EOSABS 0.7 10/30/2011 2212   BASOSABS 0.0 10/30/2011 2212                          BMET    Component Value Date/Time   NA 142 01/10/2012 1346   K 3.7 01/10/2012 1346   CL 106 01/10/2012 1346   CO2 24 01/10/2012 1346   GLUCOSE 146* 01/10/2012 1346   BUN 10 01/10/2012 1346   CREATININE 1.10 01/10/2012 1346   CALCIUM 9.7 01/10/2012 1346   GFRNONAA 49* 01/10/2012 1346   GFRAA 56* 01/10/2012 1346     Assessment/Plan: Patient with a dynamic degenerative spinal listhesis of L4 on L5 secondary to lumbar spondylosis and lumbar degenerative disc disease, with resulting lumbar stenosis. Patient is admitted for lumbar decompression and stabilization. I've discussed with the patient the nature of his condition, the nature the surgical procedure, the typical length of surgery, hospital stay, and overall recuperation,  the limitations postoperatively, and risks of surgery. I discussed risks including risks of infection, bleeding, possibly need for transfusion, the risk of nerve root dysfunction with pain, weakness, numbness, or paresthesias, the risk of  dural tear and CSF leakage and possible need for further surgery, the risk of failure of the arthrodesis and possibly for further surgery, the risk of anesthetic complications including myocardial infarction, stroke, pneumonia, and death. We discussed the need for postoperative immobilization in a lumbar brace. Understanding all this the patient does wish to proceed with surgery and is admitted for such.     Hewitt Shorts, MD 01/15/2012 7:28 AM

## 2012-01-15 NOTE — Op Note (Signed)
01/15/2012  1:34 PM  PATIENT:  [name]  76 y.o. female  PRE-OPERATIVE DIAGNOSIS:  spondylolisthesis lumbar stenosis lumbar spondylosis lumbar radiuclopathy  POST-OPERATIVE DIAGNOSIS:  spondylolisthesis lumbar stenosis lumbar spondylosis lumbar radiuclopathy  PROCEDURE:  Procedure(s): POSTERIOR LUMBAR FUSION 1 LEVEL  SURGEON:  Surgeon(s): Hewitt Shorts, MD Clydene Fake, MD  ASSISTANTS: Clydene Fake, M.D.  ANESTHESIA:   general  EBL:  Total I/O In: 1800 [I.V.:1300; IV Piggyback:500] Out: 825 [Urine:625; Blood:200]  BLOOD ADMINISTERED:none  CELL SAVER GIVEN: None  COUNT: Correct per nursing staff  DRAINS: none   SPECIMEN:  No Specimen  DICTATION: Patient is brought to the operating room placed under general endotracheal anesthesia. The patient was turned to prone position the lumbar region was prepped with Betadine soap and solution and draped in a sterile fashion. The midline was infiltrated with local site with epinephrine. A localizing x-ray was taken and then a midline incision is made carried down to subcutaneous tissue bipolar cautery and electrocautery were used to maintain hemostasis dissection was carried out the lumbar fascia. The fascia was incised bilaterally and the paraspinal muscles were dissected with a spinous process and lamina in a subperiosteal fashion. Another x-ray was taken for localization and the L4-5 level was localized. Dissection was then carried out laterally over the facet complex and the transverse processes of L4 and L5 were exposed and decorticated. Decompression via laminotomy/laminectomy was performed using the high-speed drill and Kerrison punches. Dissection was carried out laterally including facetectomy and foraminotomies. With decompression of the L4 and L5 nerve roots bilaterally. Once the decompression of the thecal sac and exiting nerve roots was completed we proceeded with the posterior lumbar interbody arthrodesis. The  annulus was incised bilaterally and the disc space entered a thorough discectomy was performed using pituitary rongeurs and curettes. Once the discectomy was completed we began to prepare the endplate surfaces removing the cartilaginous endplates surface with paddle curettes. We then measured the height of the intervertebral disc space. We selected 10 x 25 x 4 AVS peek interbody implants.  The C-arm fluoroscope was then draped and brought in the field and we identified the pedicle entry points bilaterally at the L4 and L5 levels. Each of the 4 pedicles was probed, we aspirated bone marrow aspirate from the vertebral bodies this was injected over a 10 cc strip of Vitoss. Then each of the pedicles was examined with the ball probe good bony surfaces were found and no bony cuts were found. Each of the pedicles was then tapped with a 5.25 mm tap, again examined with the ball probe good threading was found and no bony cuts were found. We then placed 5.75 by 40 millimeter screws bilaterally at the L4 level and 5.75 x 45 mm screws bilaterally at the L5 level.  We then packed the AVS peek interbody implants with Vitoss with bone marrow aspirate, and then replace the first implant and on the right side, carefully retracting the thecal sac and nerve root medially. We then went back to the left side and packed the midline with additional Vitoss with bone marrow aspirate and then placed a second implant and on the left side again retracting the thecal sac and nerve root medially. Additional Vitoss with bone marrow aspirate was packed lateral to the implants.  We then packed the lateral gutter over the transverse processes and intertransverse space with Vitoss with bone marrow aspirate. We then selected a 35 mm rods, they were placed within the screw heads and  secured with locking caps once all 4 locking caps were placed final tightening was performed against a counter torque. Next  The wound had been irrigated multiple  times during the procedure with saline solution and bacitracin solution, good hemostasis was established with a combination of bipolar cautery and Gelfoam with thrombin. Once good hemostasis was confirmed we proceeded with closure paraspinal muscles deep fascia and Scarpa's fascia were closed with interrupted undyed 1 Vicryl sutures the subcutaneous and subcuticular closed with interrupted inverted 2-0 undyed Vicryl sutures the skin edges were approximated with Dermabond.  Following surgery the patient was turned back to the supine position to be reversed and the anesthetic extubated and transferred to the recovery room for further care.  PLAN OF CARE: Admit to inpatient   PATIENT DISPOSITION:  PACU - hemodynamically stable.   Delay start of Pharmacological VTE agent (>24hrs) due to surgical blood loss or risk of bleeding:  yes

## 2012-01-15 NOTE — Transfer of Care (Signed)
Immediate Anesthesia Transfer of Care Note  Patient: [name]  Procedure(s) Performed:  POSTERIOR LUMBAR FUSION 1 LEVEL - Lumbar Four-Five decompression with posterior lumbar interbody fusion with interbody prothesis and posterolateral arthrodesis and posterior nonsegmental instrumentation  Patient Location: PACU  Anesthesia Type: General  Level of Consciousness: awake, alert  and oriented  Airway & Oxygen Therapy: Patient Spontanous Breathing and Patient connected to face mask oxygen  Post-op Assessment: Report given to PACU RN, Post -op Vital signs reviewed and stable and Patient moving all extremities X 4  Post vital signs: Reviewed and stable  Complications: No apparent anesthesia complications

## 2012-01-15 NOTE — Anesthesia Procedure Notes (Signed)
Procedure Name: Intubation Date/Time: 01/15/2012 10:23 AM Performed by: Elon Alas Pre-anesthesia Checklist: Patient identified, Timeout performed, Emergency Drugs available, Suction available and Patient being monitored Patient Re-evaluated:Patient Re-evaluated prior to inductionOxygen Delivery Method: Circle System Utilized Preoxygenation: Pre-oxygenation with 100% oxygen Intubation Type: IV induction Ventilation: Mask ventilation without difficulty and Oral airway inserted - appropriate to patient size Laryngoscope Size: Mac and 3 Grade View: Grade II Tube type: Oral Tube size: 7.0 mm Number of attempts: 1 Airway Equipment and Method: stylet Placement Confirmation: ETT inserted through vocal cords under direct vision,  positive ETCO2 and breath sounds checked- equal and bilateral Secured at: 21 cm Tube secured with: Tape Dental Injury: Teeth and Oropharynx as per pre-operative assessment

## 2012-01-15 NOTE — Anesthesia Postprocedure Evaluation (Signed)
  Anesthesia Post-op Note  Patient: [name]  Procedure(s) Performed:  POSTERIOR LUMBAR FUSION 1 LEVEL - Lumbar Four-Five decompression with posterior lumbar interbody fusion with interbody prothesis and posterolateral arthrodesis and posterior nonsegmental instrumentation  Patient Location: PACU  Anesthesia Type: General  Level of Consciousness: awake, oriented, sedated and patient cooperative  Airway and Oxygen Therapy: Patient Spontanous Breathing and Patient connected to nasal cannula oxygen  Post-op Pain: mild  Post-op Assessment: Post-op Vital signs reviewed, Patient's Cardiovascular Status Stable, Respiratory Function Stable, Patent Airway, No signs of Nausea or vomiting and Pain level controlled  Post-op Vital Signs: stable  Complications: No apparent anesthesia complications

## 2012-01-15 NOTE — Progress Notes (Signed)
Pt. Confused, BP high , DR, Katrinka Blazing aware and orders recieved

## 2012-01-15 NOTE — Anesthesia Preprocedure Evaluation (Signed)
Anesthesia Evaluation  Patient identified by MRN, date of birth, ID band Patient awake    Reviewed: Allergy & Precautions, H&P , NPO status , Patient's Chart, lab work & pertinent test results  History of Anesthesia Complications (+) AWARENESS UNDER ANESTHESIA  Airway Mallampati: II TM Distance: >3 FB Neck ROM: full    Dental  (+) Teeth Intact   Pulmonary shortness of breath, asthma , sleep apnea ,    Pulmonary exam normal       Cardiovascular + angina regular Normal    Neuro/Psych Negative Neurological ROS  Negative Psych ROS   GI/Hepatic negative GI ROS, Neg liver ROS,   Endo/Other  Diabetes mellitus-, Type 2  Renal/GU   Genitourinary negative   Musculoskeletal   Abdominal   Peds  Hematology negative hematology ROS (+)   Anesthesia Other Findings   Reproductive/Obstetrics                           Anesthesia Physical Anesthesia Plan  ASA: III  Anesthesia Plan: General ETT   Post-op Pain Management:    Induction: Intravenous  Airway Management Planned: Oral ETT  Additional Equipment:   Intra-op Plan:   Post-operative Plan: Extubation in OR  Informed Consent: I have reviewed the patients History and Physical, chart, labs and discussed the procedure including the risks, benefits and alternatives for the proposed anesthesia with the patient or authorized representative who has indicated his/her understanding and acceptance.     Plan Discussed with: Anesthesiologist, CRNA and Surgeon  Anesthesia Plan Comments:         Anesthesia Quick Evaluation

## 2012-01-16 NOTE — Evaluation (Signed)
Occupational Therapy Evaluation Patient Details Name: [name] MRN: [id number] DOB: October 27, 1936 Today's Date: 01/16/2012 15:11-16:00  evII Problem List:  Patient Active Problem List  Diagnoses  . DIABETES MELLITUS, TYPE II  . HYPERLIPIDEMIA  . OBSTRUCTIVE SLEEP APNEA  . HYPERTENSION  . RHINOSINUSITIS, CHRONIC  . ALLERGIC RHINITIS  . ASTHMA    Past Medical History:  Past Medical History  Diagnosis Date  . Complication of anesthesia     OCC HALLUCINATIONS  . S/P angioplasty     10/12  . Angina     INFREG  . Asthma     NO PROBLEMS RECENTLY  . Shortness of breath   . Sleep apnea     06  . GI bleed     12  4 UNITS OF BLOOD  . Blood transfusion     LATE 12 4 UNITS FOR GI BLEED  . Chronic kidney disease     STONES  . Hypertension   . Arthritis   . Diabetes mellitus     OFF JANUVIA AND HUMALOG SINCE NOV  . Anemia   . Glaucoma    Past Surgical History:  Past Surgical History  Procedure Date  . Coronary angioplasty   . Abdominal hysterectomy   . Hip arthroplasty     BIL  . Knee arthroplasty     LFT  . Eye surgery     CAT IOL LFT    OT Assessment/Plan/Recommendation OT Assessment Clinical Impression Statement: Pleasant 76 yr old female admitted for lumbar fusion presents with decreased independence with selfcare tasks and functional transfers.  Currently at an overall min assist for selfcare tasks, will benefit from acute OT services to address these issues in order to increase independence for home.  Feel pt will benefit from a HHOT eval to help determine home modifications and help pt progress to modified/independent level.  Pt will have initial supervision from her brother but only for a few days. OT Recommendation/Assessment: Patient will need skilled OT in the acute care venue OT Problem List: Impaired balance (sitting and/or standing);Decreased strength;Decreased knowledge of use of DME or AE;Decreased knowledge of precautions OT Therapy Diagnosis :  Generalized weakness OT Plan OT Frequency: Min 2X/week OT Treatment/Interventions: Self-care/ADL training;DME and/or AE instruction;Balance training;Patient/family education;Therapeutic activities OT Recommendation Follow Up Recommendations: Home health OT Equipment Recommended: None recommended by OT Individuals Consulted Consulted and Agree with Results and Recommendations: Patient;Family member/caregiver Family Member Consulted: Pt's brother OT Goals Acute Rehab OT Goals OT Goal Formulation: With patient ADL Goals Pt Will Perform Grooming: with modified independence;Standing at sink;Other (comment) (Maintaining back precautions.) ADL Goal: Grooming - Progress: Goal set today Pt Will Perform Lower Body Dressing: with supervision;with adaptive equipment;Sit to stand from bed;with cueing (comment type and amount) (No more than min instructional cues.) ADL Goal: Lower Body Dressing - Progress: Goal set today Miscellaneous OT Goals Miscellaneous OT Goal #1: Pt will donn lumbar corsett in sitting with no more than one instructional cue. OT Goal: Miscellaneous Goal #1 - Progress: Goal set today Miscellaneous OT Goal #2: Pt will state 3/3 back precautions independently. OT Goal: Miscellaneous Goal #2 - Progress: Goal set today  OT Evaluation Precautions/Restrictions  Precautions Precautions: Back Precaution Booklet Issued: No (Pt given back precaution handout) Required Braces or Orthoses: Yes Spinal Brace: Lumbar corset Restrictions Weight Bearing Restrictions: No Prior Functioning Home Living Lives With: Alone (but brother staying with pt intially 24hr/day) Type of Home: House Home Layout: One level Home Access: Stairs to enter Entrance Stairs-Rails: None  Entrance Stairs-Number of Steps: ~3 Bathroom Shower/Tub: Engineer, manufacturing systems: Standard Bathroom Accessibility: Yes How Accessible: Accessible via walker Home Adaptive Equipment: Walker - rolling;Shower chair with  back Prior Function Level of Independence: Requires assistive device for independence Vocation: Retired Comments: Pt was previously requiring more assistance/ not walking much secondary to back pain. Pt reports she is now much more mobile.  ADL ADL Eating/Feeding: Simulated;Independent Where Assessed - Eating/Feeding: Chair Grooming: Simulated;Supervision/safety Where Assessed - Grooming: Standing at sink Upper Body Bathing: Simulated;Set up Where Assessed - Upper Body Bathing: Sitting, chair Lower Body Bathing: Simulated;Supervision/safety Where Assessed - Lower Body Bathing: Sit to stand from chair Upper Body Dressing: Simulated;Moderate assistance Upper Body Dressing Details (indicate cue type and reason): Mod instructional cueing to donn lumbar corset in sitting. Where Assessed - Upper Body Dressing: Sitting, chair Lower Body Dressing: Performed;Minimal assistance Lower Body Dressing Details (indicate cue type and reason): Pt educated on AE for LB selfcare Where Assessed - Lower Body Dressing: Sit to stand from bed Toilet Transfer: Performed;Supervision/safety Toilet Transfer Method: Proofreader: Regular height toilet Toileting - Clothing Manipulation: Simulated;Supervision/safety Where Assessed - Toileting Clothing Manipulation: Sit to stand from 3-in-1 or toilet;Standing Toileting - Hygiene: Simulated;Supervision/safety Where Assessed - Toileting Hygiene: Sit to stand from 3-in-1 or toilet Tub/Shower Transfer: Performed;Supervision/safety Tub/Shower Transfer Method: Ambulating Ambulation Related to ADLs: Pt overall min guard assist for ambulation with TLSO and no assistive device. ADL Comments: Pt unable to initially recall any back precautions without mod questioning cues.  Educated pt and her brother on AE and he purchased for use at home.  Needs continued practice/repetition, including donning lumbar corsett. Vision/Perception  Vision -  History Baseline Vision: No visual deficits Patient Visual Report: No change from baseline Cognition Cognition Arousal/Alertness: Awake/alert Overall Cognitive Status: Appears within functional limits for tasks assessed Orientation Level: Oriented X4 Sensation/Coordination Sensation Light Touch: Appears Intact Stereognosis: Not tested Hot/Cold: Not tested Proprioception: Not tested Coordination Gross Motor Movements are Fluid and Coordinated: Yes Fine Motor Movements are Fluid and Coordinated: Yes Extremity Assessment RUE Assessment RUE Assessment: Within Functional Limits LUE Assessment LUE Assessment: Within Functional Limits Mobility  Bed Mobility Bed Mobility: Yes Rolling Left: 4: Min assist Rolling Left Details (indicate cue type and reason): Mod instructional cues for correct technique to follow back precautions. Left Sidelying to Sit: 4: Min assist;HOB flat Transfers Transfers: Yes Sit to Stand: 5: Supervision Stand to Sit: 5: Supervision;To toilet;Without upper extremity assist Exercises  End of Session OT - End of Session Activity Tolerance: Patient tolerated treatment well Patient left: in chair;with call bell in reach;with family/visitor present General Behavior During Session: [facility] for tasks performed Cognition: [facility] for tasks performed   Xinyi Batton OTR/L 01/16/2012, 5:18 PM  Pager number [phone]

## 2012-01-16 NOTE — Evaluation (Signed)
Physical Therapy Evaluation Patient Details Name: [name] MRN: [id number] DOB: [birth year] Today's Date: 01/16/2012  Problem List:  Patient Active Problem List  Diagnoses  . DIABETES MELLITUS, TYPE II  . HYPERLIPIDEMIA  . OBSTRUCTIVE SLEEP APNEA  . HYPERTENSION  . RHINOSINUSITIS, CHRONIC  . ALLERGIC RHINITIS  . ASTHMA    Past Medical History:  Past Medical History  Diagnosis Date  . Complication of anesthesia     OCC HALLUCINATIONS  . S/P angioplasty     10/12  . Angina     INFREG  . Asthma     NO PROBLEMS RECENTLY  . Shortness of breath   . Sleep apnea     06  . GI bleed     12  4 UNITS OF BLOOD  . Blood transfusion     LATE 12 4 UNITS FOR GI BLEED  . Chronic kidney disease     STONES  . Hypertension   . Arthritis   . Diabetes mellitus     OFF JANUVIA AND HUMALOG SINCE NOV  . Anemia   . Glaucoma    Past Surgical History:  Past Surgical History  Procedure Date  . Coronary angioplasty   . Abdominal hysterectomy   . Hip arthroplasty     BIL  . Knee arthroplasty     LFT  . Eye surgery     CAT IOL LFT    PT Assessment/Plan/Recommendation PT Assessment Clinical Impression Statement: 76 year old female s/p Lumbar Four-Five decompression with posterior lumbar fusion. Pt mobilizing wonderfully, will likely only need one more PT session to continue to work on dynamic balance during gait. Recomend pt have supervision/assistance at home from brother initially.  PT Recommendation/Assessment: Patient will need skilled PT in the acute care venue PT Problem List: Decreased balance;Pain Barriers to Discharge: None PT Therapy Diagnosis : Abnormality of gait PT Plan PT Frequency: Min 5X/week PT Treatment/Interventions: Gait training;Stair training;Functional mobility training;Therapeutic activities;Therapeutic exercise;Balance training;Patient/family education PT Recommendation Follow Up Recommendations: Home health PT;Supervision for mobility/OOB Equipment  Recommended:  (Shower DME pending OT eval) PT Goals  Acute Rehab PT Goals PT Goal Formulation: With patient Time For Goal Achievement: 7 days Pt will Roll Supine to Left Side: with modified independence PT Goal: Rolling Supine to Left Side - Progress: Not met Pt will go Supine/Side to Sit: with modified independence PT Goal: Supine/Side to Sit - Progress: Not met Pt will go Sit to Supine/Side: with modified independence PT Goal: Sit to Supine/Side - Progress: Not met Pt will go Sit to Stand: Independently PT Goal: Sit to Stand - Progress: Not met Pt will go Stand to Sit: Independently PT Goal: Stand to Sit - Progress: Not met Pt will Ambulate: >150 feet;with modified independence;with least restrictive assistive device PT Goal: Ambulate - Progress: Not met Pt will Go Up / Down Stairs: 3-5 stairs;with modified independence (no rail) PT Goal: Up/Down Stairs - Progress: Not met  PT Evaluation Precautions/Restrictions  Precautions Precautions: Back Precaution Booklet Issued: No (Pt given back precaution handout) Required Braces or Orthoses: Yes Spinal Brace: Lumbar corset;Applied in sitting position (when OOB) Restrictions Weight Bearing Restrictions: No Prior Functioning  Home Living Lives With: Alone (but brother staying with pt intially 24hr/day) Type of Home: House Home Layout: One level Home Access: Stairs to enter Entrance Stairs-Rails: None Entrance Stairs-Number of Steps: ~3 Bathroom Shower/Tub: Engineer, manufacturing systems: Standard Bathroom Accessibility: Yes How Accessible: Accessible via walker Home Adaptive Equipment: Walker - rolling Prior Function Level of Independence:  Requires assistive device for independence Vocation: Retired Comments: Pt was previously requiring more assistance/ not walking much secondary to back pain. Pt reports she is now much more mobile.  Cognition Cognition Arousal/Alertness: Awake/alert Overall Cognitive Status: Appears within  functional limits for tasks assessed Orientation Level: Oriented X4 Sensation/Coordination Sensation Light Touch: Appears Intact (per pt ) Extremity Assessment RLE Assessment RLE Assessment: Within Functional Limits LLE Assessment LLE Assessment: Within Functional Limits Mobility (including Balance) Bed Mobility Bed Mobility: No (seated at start, verbally reviewed and demonstrated log roll) Transfers Transfers: Yes Sit to Stand: 6: Modified independent (Device/Increase time) Stand to Sit: 6: Modified independent (Device/Increase time) Ambulation/Gait Ambulation/Gait: Yes Ambulation/Gait Assistance: 5: Supervision Ambulation/Gait Assistance Details (indicate cue type and reason): Supervision for increased sway, pt able to self correct however needs supervision for safety. Pt with wide base of support indicating some instability.  Ambulation Distance (Feet): 275 Feet Assistive device: None Gait Pattern: Step-through pattern;Decreased stride length Stairs: Yes    Exercise  General Exercises - Lower Extremity Ankle Circles/Pumps: AROM;Both;10 reps;Seated Short Arc Quad: AROM;Both;10 reps;Seated End of Session PT - End of Session Equipment Utilized During Treatment: Gait belt;Back brace Activity Tolerance: Patient tolerated treatment well Patient left: in chair;with family/visitor present;with call bell in reach Nurse Communication: Mobility status for transfers;Mobility status for ambulation General Behavior During Session: [facility] for tasks performed Cognition: [facility] for tasks performed  Sherie Don 01/16/2012, 3:12 PM  Sherie Don) Carleene Mains PT, DPT Acute Rehabilitation [phone]

## 2012-01-16 NOTE — Progress Notes (Signed)
Filed Vitals:   01/15/12 2115 01/16/12 0000 01/16/12 0400 01/16/12 0643  BP: 147/77   143/75  Pulse: 92   70  Temp: 97.9 F (36.6 C)   98.4 F (36.9 C)  TempSrc: Oral   Oral  Resp: 18 16 16 18   Height:  5' (1.524 m)    Weight:  72.576 kg (160 lb)    SpO2: 100% 99% 100% 94%    Patient resting comfortably in bed. Ambulate in last night and again earlier this morning. Dressing applied by nursing staff. Using PCA minimally. We'll discontinue PCA and convert to oral analgesics. We'll DC Foley today and dressing in a.m. Moving all extremities well.   Plan: Encouraged to ambulate actively in the halls today. To be seen by PT and OT.

## 2012-01-17 MED ORDER — OXYCODONE-ACETAMINOPHEN 5-325 MG PO TABS: 1.0000 | ORAL_TABLET | ORAL | Status: AC | PRN

## 2012-01-17 NOTE — Progress Notes (Signed)
Occupational Therapy Treatment Patient Details Name: [name] MRN: [id number] DOB: December 17, 1936 Today's Date: 01/17/2012  OT Assessment/Plan OT Assessment/Plan Comments on Treatment Session: Pt with increased I with functional transfers and maintaining back precautions. OT Plan: Discharge plan remains appropriate OT Frequency: Min 2X/week Follow Up Recommendations: Home health OT Equipment Recommended: None recommended by PT;None recommended by OT OT Goals ADL Goals Pt Will Perform Grooming: with modified independence;Standing at sink;Other (comment) ADL Goal: Grooming - Progress: Met Miscellaneous OT Goals Miscellaneous OT Goal #1: Pt will donn lumbar corsett in sitting with no more than one instructional cue. OT Goal: Miscellaneous Goal #1 - Progress: Progressing toward goals Miscellaneous OT Goal #2: Pt will state 3/3 back precautions independently. OT Goal: Miscellaneous Goal #2 - Progress: Met  OT Treatment Precautions/Restrictions  Precautions Precautions: Back Required Braces or Orthoses: Yes Spinal Brace: Lumbar corset   ADL ADL Grooming: Simulated;Modified independent Where Assessed - Grooming: Standing at sink Toilet Transfer: Buyer, retail Method: Ambulating Ambulation Related to ADLs: Pt demonstrating functional transfers with supervision for safety with TLSO and no assistve device. ADL Comments: Pt able to verbalize 3/3 back precautions.Pt and brother with increased I with donning/doffing TLSO.  Pt's brother purchased hip kit and both pt and brother report that they feel competent with AE and have no further questions. Pt's brother demonstrates I with assisting pt. Mobility  Transfers Sit to Stand: 6: Modified independent (Device/Increase time);From bed Stand to Sit: 6: Modified independent (Device/Increase time);To chair/3-in-1 Exercises    End of Session OT - End of Session Equipment Utilized During Treatment: Back  brace Activity Tolerance: Patient tolerated treatment well Patient left: in chair;with call bell in reach;with family/visitor present General Behavior During Session: [facility] for tasks performed Cognition: [facility] for tasks performed  Cipriano Mile  01/17/2012, 2:55 PM 01/17/2012 Cipriano Mile OTR/L Pager [phone] Office [phone]

## 2012-01-17 NOTE — Progress Notes (Deleted)
PT Cancel Note:   Pt is d/c'ing to Inpatient Rehab today per Ottie Glazier, Inpt Rehab admission coordinator.    Verdell Face, [place] [phone] 01/17/2012

## 2012-01-17 NOTE — Progress Notes (Signed)
Physical Therapy Treatment Patient Details Name: [name] MRN: [id number] DOB: 1936-12-06 Today's Date: 01/17/2012  PT Assessment/Plan  PT - Assessment/Plan:  Pt moving well.  At supervision level with all mobility.  She states her brother will be staying with her as long as needed.   PT Frequency: Min 5X/week Follow Up Recommendations: Home health PT;Supervision for mobility/OOB Equipment Recommended: None recommended by PT PT Goals  Acute Rehab PT Goals PT Goal: Rolling Supine to Left Side - Progress: Progressing toward goal PT Goal: Supine/Side to Sit - Progress: Progressing toward goal PT Goal: Sit to Stand - Progress: Not met PT Goal: Stand to Sit - Progress: Not met PT Goal: Ambulate - Progress: Not met  PT Treatment Precautions/Restrictions  Precautions Precautions: Back Precaution Booklet Issued: No (Pt given back precaution handout) Required Braces or Orthoses: Yes Spinal Brace: Lumbar corset Restrictions Weight Bearing Restrictions: No Mobility (including Balance) Bed Mobility Rolling Left: 5: Supervision Rolling Left Details (indicate cue type and reason): cues to reinforce back precautions.  Left Sidelying to Sit: 5: Supervision;HOB flat Left Sidelying to Sit Details (indicate cue type and reason): cues to reinforce technique & to maintain back precautions.  Transfers Sit to Stand: 6: Modified independent (Device/Increase time);From bed Stand to Sit: 6: Modified independent (Device/Increase time);To chair/3-in-1 Ambulation/Gait Ambulation/Gait Assistance: 5: Supervision Ambulation/Gait Assistance Details (indicate cue type and reason): (S) for safety.  Pt still with sway & wide BOS.  Pt states she is aware of her lateral sway.  Performed lateral/vertical head turns, side stepping, retropulsion, directional changes- No LOB noted with any of the challenges.   Ambulation Distance (Feet): 200 Feet Assistive device: None Gait Pattern: Step-through  pattern;Decreased stride length (wide BOS, Decreased reciprocal arm swing) Stairs: No Wheelchair Mobility Wheelchair Mobility: No  High Level Balance High Level Balance Activites: Side stepping;Backward walking;Direction changes;Sudden stops;Head turns High Level Balance Comments: No LOB noted.  Exercise    End of Session PT - End of Session Equipment Utilized During Treatment: Gait belt;Back brace Activity Tolerance: Patient tolerated treatment well Patient left: in chair;with call bell in reach General Behavior During Session: [facility] for tasks performed Cognition: [facility] for tasks performed  Lara Mulch 01/17/2012, 1:14 PM [phone]

## 2012-01-17 NOTE — Discharge Summary (Signed)
Physician Discharge Summary  Patient ID: [name] MRN: [id number] DOB/AGE: 09/02/1936 76 y.o.  Admit date: 01/15/2012 Discharge date: 01/17/2012  Admission Diagnoses: Lumbar spondylolisthesis, lumbar stenosis, lumbar spondylosis, lumbar radiculopathy  Discharge Diagnoses: Lumbar spondylolisthesis, lumbar stenosis, lumbar spondylosis, lumbar radiculopathy  Discharged Condition: good  Hospital Course: Patient was admitted underwent an L4-5 lumbar decompression, PLIF, and PLA. Postoperatively she has done quite well. She ambulated the night of surgery, and his continue to increase her ambulation and activity. She was seen by PT and OT postoperatively and is making good progress. Her wound is clean and dry, the Dermabond is in place, there is no erythema, ecchymosis, swelling, or drainage. I have given her instructions regarding wound care and activities following discharge. She is to return for followup with me in 3 weeks.  Discharge Exam: Blood pressure 171/84, pulse 70, temperature 98.3 F (36.8 C), temperature source Oral, resp. rate 18, height 5' (1.524 m), weight 72.576 kg (160 lb), SpO2 98.00%.  Disposition: Home   Medication List  As of 01/17/2012  1:02 PM   TAKE these medications         albuterol 108 (90 BASE) MCG/ACT inhaler   Commonly known as: PROVENTIL HFA;VENTOLIN HFA   Inhale 2 puffs into the lungs every 6 (six) hours as needed. Shortness of breath      Besifloxacin HCl 0.6 % Susp   Place 1 drop into the left eye 2 (two) times daily.      bimatoprost 0.03 % ophthalmic solution   Commonly known as: LUMIGAN   Place 1 drop into both eyes at bedtime.      brimonidine 0.15 % ophthalmic solution   Commonly known as: ALPHAGAN   Place 1 drop into both eyes 2 (two) times daily.      bromfenac 0.09 % ophthalmic solution   Commonly known as: XIBROM   Place 1 drop into the left eye every morning.      donepezil 10 MG tablet   Commonly known as: ARICEPT   Take 10 mg  by mouth at bedtime as needed.      folic acid 1 MG tablet   Commonly known as: FOLVITE   Take 1 mg by mouth daily.      METHOTREXATE SODIUM LPF IJ   Inject 25 mg/day as directed 7 days. On Monday.      metoprolol succinate 25 MG 24 hr tablet   Commonly known as: TOPROL-XL   Take 25 mg by mouth daily.      nitroGLYCERIN 0.4 MG SL tablet   Commonly known as: NITROSTAT   Place 0.4 mg under the tongue every 5 (five) minutes as needed. Chest pain      oxyCODONE-acetaminophen 5-325 MG per tablet   Commonly known as: PERCOCET   Take 1-2 tablets by mouth every 4 (four) hours as needed for pain.      prednisoLONE acetate 1 % ophthalmic suspension   Commonly known as: PRED FORTE   Place 1 drop into the left eye 2 (two) times daily.      predniSONE 5 MG tablet   Commonly known as: DELTASONE   Take 5 mg by mouth daily.      simvastatin 40 MG tablet   Commonly known as: ZOCOR   Take 40 mg by mouth at bedtime.      telmisartan 80 MG tablet   Commonly known as: MICARDIS   Take 80 mg by mouth daily.  Signed: Hewitt Shorts, MD 01/17/2012, 1:02 PM

## 2012-01-18 MED FILL — Heparin Sodium (Porcine) Inj 1000 Unit/ML: INTRAMUSCULAR | Qty: 30 | Status: AC

## 2012-01-18 MED FILL — Sodium Chloride IV Soln 0.9%: INTRAVENOUS | Qty: 1000 | Status: AC

## 2012-12-23 LAB — HEMOGLOBIN A1C: Hgb A1c MFr Bld: 6.3 % — AB (ref 4.0–6.0)

## 2012-12-23 LAB — BASIC METABOLIC PANEL
Creatinine: 1.1 mg/dL (ref 0.5–1.1)
Potassium: 3.8 mmol/L (ref 3.4–5.3)
Sodium: 143 mmol/L (ref 137–147)

## 2012-12-23 LAB — LIPID PANEL
LDl/HDL Ratio: 4.3
Triglycerides: 99 mg/dL (ref 40–160)

## 2012-12-23 LAB — CBC AND DIFFERENTIAL: Platelets: 14 10*3/uL — AB (ref 150–399)

## 2013-03-03 ENCOUNTER — Encounter: Payer: Self-pay | Admitting: Hematology

## 2013-04-04 ENCOUNTER — Inpatient Hospital Stay (HOSPITAL_COMMUNITY): Payer: Medicare PPO

## 2013-04-04 ENCOUNTER — Encounter (HOSPITAL_COMMUNITY): Payer: Self-pay | Admitting: *Deleted

## 2013-04-04 ENCOUNTER — Inpatient Hospital Stay (HOSPITAL_COMMUNITY)
Admission: EM | Admit: 2013-04-04 | Discharge: 2013-04-09 | DRG: 378 | Disposition: A | Discharge status: Home / Self Care | Payer: Medicare PPO | Attending: Internal Medicine | Admitting: Internal Medicine

## 2013-04-04 DIAGNOSIS — N189 Chronic kidney disease, unspecified: Secondary | ICD-10-CM | POA: Diagnosis present

## 2013-04-04 DIAGNOSIS — Z23 Encounter for immunization: Secondary | ICD-10-CM

## 2013-04-04 DIAGNOSIS — D72829 Elevated white blood cell count, unspecified: Secondary | ICD-10-CM

## 2013-04-04 DIAGNOSIS — I10 Essential (primary) hypertension: Secondary | ICD-10-CM

## 2013-04-04 DIAGNOSIS — I129 Hypertensive chronic kidney disease with stage 1 through stage 4 chronic kidney disease, or unspecified chronic kidney disease: Secondary | ICD-10-CM | POA: Diagnosis present

## 2013-04-04 DIAGNOSIS — J45909 Unspecified asthma, uncomplicated: Secondary | ICD-10-CM | POA: Diagnosis present

## 2013-04-04 DIAGNOSIS — J309 Allergic rhinitis, unspecified: Secondary | ICD-10-CM

## 2013-04-04 DIAGNOSIS — K5730 Diverticulosis of large intestine without perforation or abscess without bleeding: Secondary | ICD-10-CM

## 2013-04-04 DIAGNOSIS — K625 Hemorrhage of anus and rectum: Secondary | ICD-10-CM

## 2013-04-04 DIAGNOSIS — Z8249 Family history of ischemic heart disease and other diseases of the circulatory system: Secondary | ICD-10-CM

## 2013-04-04 DIAGNOSIS — M1990 Unspecified osteoarthritis, unspecified site: Secondary | ICD-10-CM

## 2013-04-04 DIAGNOSIS — T380X5A Adverse effect of glucocorticoids and synthetic analogues, initial encounter: Secondary | ICD-10-CM | POA: Diagnosis present

## 2013-04-04 DIAGNOSIS — Z9861 Coronary angioplasty status: Secondary | ICD-10-CM

## 2013-04-04 DIAGNOSIS — R413 Other amnesia: Secondary | ICD-10-CM | POA: Diagnosis present

## 2013-04-04 DIAGNOSIS — D62 Acute posthemorrhagic anemia: Secondary | ICD-10-CM | POA: Diagnosis present

## 2013-04-04 DIAGNOSIS — D500 Iron deficiency anemia secondary to blood loss (chronic): Secondary | ICD-10-CM

## 2013-04-04 DIAGNOSIS — M129 Arthropathy, unspecified: Secondary | ICD-10-CM

## 2013-04-04 DIAGNOSIS — Z79899 Other long term (current) drug therapy: Secondary | ICD-10-CM

## 2013-04-04 DIAGNOSIS — J328 Other chronic sinusitis: Secondary | ICD-10-CM

## 2013-04-04 DIAGNOSIS — I2510 Atherosclerotic heart disease of native coronary artery without angina pectoris: Secondary | ICD-10-CM | POA: Diagnosis present

## 2013-04-04 DIAGNOSIS — E785 Hyperlipidemia, unspecified: Secondary | ICD-10-CM | POA: Diagnosis present

## 2013-04-04 DIAGNOSIS — E119 Type 2 diabetes mellitus without complications: Secondary | ICD-10-CM | POA: Diagnosis present

## 2013-04-04 DIAGNOSIS — M069 Rheumatoid arthritis, unspecified: Secondary | ICD-10-CM | POA: Diagnosis present

## 2013-04-04 DIAGNOSIS — Z886 Allergy status to analgesic agent status: Secondary | ICD-10-CM

## 2013-04-04 DIAGNOSIS — K5790 Diverticulosis of intestine, part unspecified, without perforation or abscess without bleeding: Secondary | ICD-10-CM

## 2013-04-04 DIAGNOSIS — K922 Gastrointestinal hemorrhage, unspecified: Secondary | ICD-10-CM

## 2013-04-04 DIAGNOSIS — G4733 Obstructive sleep apnea (adult) (pediatric): Secondary | ICD-10-CM | POA: Diagnosis present

## 2013-04-04 DIAGNOSIS — K5731 Diverticulosis of large intestine without perforation or abscess with bleeding: Principal | ICD-10-CM | POA: Diagnosis present

## 2013-04-04 HISTORY — DX: Other amnesia: R41.3

## 2013-04-04 HISTORY — DX: Calculus of kidney: N20.0

## 2013-04-04 HISTORY — DX: Atherosclerotic heart disease of native coronary artery without angina pectoris: I25.10

## 2013-04-04 LAB — URINALYSIS, ROUTINE W REFLEX MICROSCOPIC
Bilirubin Urine: NEGATIVE
Glucose, UA: NEGATIVE mg/dL
Ketones, ur: NEGATIVE mg/dL
Nitrite: NEGATIVE
Specific Gravity, Urine: 1.009 (ref 1.005–1.030)
pH: 7 (ref 5.0–8.0)

## 2013-04-04 LAB — CBC WITH DIFFERENTIAL/PLATELET
HCT: 30.4 % — ABNORMAL LOW (ref 36.0–46.0)
Hemoglobin: 10.5 g/dL — ABNORMAL LOW (ref 12.0–15.0)
Lymphocytes Relative: 18 % (ref 12–46)
Monocytes Absolute: 0.7 10*3/uL (ref 0.1–1.0)
Monocytes Relative: 4 % (ref 3–12)
Neutro Abs: 11.8 10*3/uL — ABNORMAL HIGH (ref 1.7–7.7)
RBC: 3.79 MIL/uL — ABNORMAL LOW (ref 3.87–5.11)
WBC: 15.2 10*3/uL — ABNORMAL HIGH (ref 4.0–10.5)

## 2013-04-04 LAB — CBC
Hemoglobin: 8.9 g/dL — ABNORMAL LOW (ref 12.0–15.0)
MCH: 28.1 pg (ref 26.0–34.0)
MCHC: 35 g/dL (ref 30.0–36.0)
MCV: 80.1 fL (ref 78.0–100.0)
Platelets: 202 10*3/uL (ref 150–400)
RBC: 3.17 MIL/uL — ABNORMAL LOW (ref 3.87–5.11)

## 2013-04-04 LAB — BASIC METABOLIC PANEL
BUN: 23 mg/dL (ref 6–23)
CO2: 23 mEq/L (ref 19–32)
Chloride: 102 mEq/L (ref 96–112)
Creatinine, Ser: 1.01 mg/dL (ref 0.50–1.10)

## 2013-04-04 LAB — GLUCOSE, CAPILLARY: Glucose-Capillary: 107 mg/dL — ABNORMAL HIGH (ref 70–99)

## 2013-04-04 LAB — MRSA PCR SCREENING: MRSA by PCR: NEGATIVE

## 2013-04-04 MED ORDER — SODIUM CHLORIDE 0.9 % IJ SOLN
3.0000 mL | Freq: Two times a day (BID) | INTRAMUSCULAR | Status: DC
  Administered 2013-04-04 – 2013-04-09 (×10): 3 mL via INTRAVENOUS

## 2013-04-04 MED ORDER — INSULIN ASPART 100 UNIT/ML ~~LOC~~ SOLN
0.0000 [IU] | Freq: Three times a day (TID) | SUBCUTANEOUS | Status: DC
  Administered 2013-04-07 (×3): 1 [IU] via SUBCUTANEOUS

## 2013-04-04 MED ORDER — BRIMONIDINE TARTRATE 0.2 % OP SOLN
1.0000 [drp] | Freq: Two times a day (BID) | OPHTHALMIC | Status: DC
  Administered 2013-04-04 – 2013-04-09 (×10): 1 [drp] via OPHTHALMIC
  Filled 2013-04-04: qty 5

## 2013-04-04 MED ORDER — SODIUM CHLORIDE 0.9 % IV BOLUS (SEPSIS)
1000.0000 mL | Freq: Once | INTRAVENOUS | Status: AC
  Administered 2013-04-04: 1000 mL via INTRAVENOUS

## 2013-04-04 MED ORDER — BRIMONIDINE TARTRATE 0.15 % OP SOLN: 1.0000 [drp] | Freq: Two times a day (BID) | OPHTHALMIC | Status: DC

## 2013-04-04 MED ORDER — SODIUM CHLORIDE 0.9 % IV SOLN
INTRAVENOUS | Status: AC
  Administered 2013-04-04: 1000 mL via INTRAVENOUS

## 2013-04-04 MED ORDER — ONDANSETRON HCL 4 MG PO TABS: 4.0000 mg | ORAL_TABLET | Freq: Four times a day (QID) | ORAL | Status: DC | PRN

## 2013-04-04 MED ORDER — METOPROLOL SUCCINATE 12.5 MG HALF TABLET
12.5000 mg | ORAL_TABLET | Freq: Every day | ORAL | Status: DC
  Administered 2013-04-05 – 2013-04-09 (×5): 12.5 mg via ORAL
  Filled 2013-04-04 (×5): qty 1

## 2013-04-04 MED ORDER — NITROGLYCERIN 0.4 MG SL SUBL: 0.4000 mg | SUBLINGUAL_TABLET | SUBLINGUAL | Status: DC | PRN

## 2013-04-04 MED ORDER — ONDANSETRON HCL 4 MG/2ML IJ SOLN
4.0000 mg | Freq: Once | INTRAMUSCULAR | Status: AC
  Administered 2013-04-04: 4 mg via INTRAVENOUS
  Filled 2013-04-04: qty 2

## 2013-04-04 MED ORDER — BIMATOPROST 0.03 % OP SOLN
1.0000 [drp] | Freq: Every day | OPHTHALMIC | Status: DC
  Administered 2013-04-04 – 2013-04-07 (×4): 1 [drp] via OPHTHALMIC
  Filled 2013-04-04: qty 2.5

## 2013-04-04 MED ORDER — ALBUTEROL SULFATE HFA 108 (90 BASE) MCG/ACT IN AERS: 2.0000 | INHALATION_SPRAY | Freq: Four times a day (QID) | RESPIRATORY_TRACT | Status: DC | PRN

## 2013-04-04 MED ORDER — PANTOPRAZOLE SODIUM 40 MG IV SOLR
40.0000 mg | Freq: Once | INTRAVENOUS | Status: AC
  Administered 2013-04-04: 40 mg via INTRAVENOUS
  Filled 2013-04-04: qty 40

## 2013-04-04 MED ORDER — ONDANSETRON HCL 4 MG/2ML IJ SOLN: 4.0000 mg | Freq: Four times a day (QID) | INTRAMUSCULAR | Status: DC | PRN

## 2013-04-04 MED ORDER — METHOTREXATE SODIUM CHEMO INJECTION 25 MG/ML PF
25.0000 mg | INTRAMUSCULAR | Status: DC
  Filled 2013-04-04: qty 1

## 2013-04-04 NOTE — H&P (Addendum)
[facility] History and Physical  [name] UJW:[id number] DOB: [birth year] DOA: 04/04/2013  Referring physician:  Dr. Laveda Norman PCP:  August Saucer ERIC, MD   Chief Complaint:  Blood per rectum Dr. Sharyn Lull cardiologist  HPI:  The patient is a 77 y.o. year-old female with history of HTN, HLD, T2DM, arthritis, diverticulosis with previous bleeding about one year ago requiring 4 unit PRBC transfusion who presents with BRBPR.  The patient was last at their baseline health yesterday.  She states about a week ago, she saw her primary care doctor who started her on a steroid taper for her arthritis.  Her joint pains improved.  She has not been taking any NSAIDS for her arthritis "because they make me bleed."  She had been having normal soft BMs regularly until this morning when she felt the sudden urge to defecate.  She had 3-4 watery bright red bowel movements this morning at home.  She was taken the urgent care by her family and during the visit she was incontinent of bright red liquid blood.  She was transported to the ER where her blood pressures were low normal.  Rectal exam demonstrated gross blood.  Hemoglobin 10.5mg /dl down from 56.2 in 13/08.  Labs were also notable for leukocytosis of 15.2.    She denies fevers, chills.  She vomited several times at urgent care yellow beige material.  Nonbilious, nonbloody.  Denies abdominal pain.  She felt a little lightheaded and felt like she was going to pass out at the urgent care, but since receiving IVF in the ER, she feels better.      Review of Systems:  Denies fevers, chills, weight loss or gain, changes to hearing and vision.  Mild hearing loss.  Wears glasses.  Denies rhinorrhea, sinus congestion, sore throat.  Denies chest pain and palpitations.  Denies SOB, wheezing, cough.  Denies nausea, vomiting, constipation, diarrhea.  Denies dysuria, frequency, urgency, polyuria, polydipsia.  Denies hematemesis, abnormal bruising or bleeding.  Denies  lymphadenopathy.  Improved arthralgias, myalgias.  Denies skin rash or ulcer.  Denies lower extremity edema.  Denies focal numbness, weakness, slurred speech, confusion, facial droop.  Denies anxiety and depression.    Past Medical History  Diagnosis Date  . Complication of anesthesia     OCC HALLUCINATIONS  . S/P angioplasty     10/12  . Angina     INFREG  . Asthma     NO PROBLEMS RECENTLY  . Shortness of breath   . Sleep apnea     06  . GI bleed     12  4 UNITS OF BLOOD  . Blood transfusion     LATE 12 4 UNITS FOR GI BLEED  . Chronic kidney disease     STONES  . Hypertension   . Arthritis   . Diabetes mellitus     OFF JANUVIA AND HUMALOG SINCE NOV  . Anemia   . 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 Kidney stone   . CAD (coronary artery disease)   . Memory loss    Past Surgical History  Procedure Laterality Date  . Coronary angioplasty    . Abdominal hysterectomy    . Hip arthroplasty      BIL  . Knee arthroplasty      LFT  . Eye surgery      CAT IOL LFT   Social History:  reports that she has never smoked. She does not have any smokeless tobacco history  on file. She reports that she does not drink alcohol or use illicit drugs.  Lives in a house no stairs other than front porch.  Lives alone.  She uses a cane occasionally.  Wears glasses.     Allergies  Allergen Reactions  . Codeine Nausea Only and Other (See Comments)    REACTION: GI upset    Family History  Problem Relation Age of Onset  . Heart disease Mother   . Heart disease Father   . Diabetes      father  . Other      atherosclerosis mother's side     Prior to Admission medications   Medication Sig Start Date End Date Taking? Authorizing Provider  albuterol (PROVENTIL HFA;VENTOLIN HFA) 108 (90 BASE) MCG/ACT inhaler Inhale 2 puffs into the lungs every 6 (six) hours as needed. Shortness of breath    Yes Historical Provider, MD  bimatoprost (LUMIGAN) 0.03 % ophthalmic solution Place  1 drop into both eyes at bedtime.     Yes Historical Provider, MD  brimonidine (ALPHAGAN) 0.15 % ophthalmic solution Place 1 drop into both eyes 2 (two) times daily.     Yes Historical Provider, MD  donepezil (ARICEPT) 10 MG tablet Take 10 mg by mouth at bedtime.    Yes Historical Provider, MD  folic acid (FOLVITE) 1 MG tablet Take 1 mg by mouth daily.     Yes Historical Provider, MD  methotrexate 25 MG/ML SOLN Inject 25 mg as directed every 7 (seven) days. Takes on Sunday   Yes Historical Provider, MD  metoprolol succinate (TOPROL-XL) 25 MG 24 hr tablet Take 25 mg by mouth daily.     Yes Historical Provider, MD  nitroGLYCERIN (NITROSTAT) 0.4 MG SL tablet Place 0.4 mg under the tongue every 5 (five) minutes as needed for chest pain. x3 doses as needed for chest pain   Yes Historical Provider, MD  predniSONE (STERAPRED UNI-PAK) 5 MG TABS Take by mouth See admin instructions. 12 day pack-Take as directed   Yes Historical Provider, MD  simvastatin (ZOCOR) 40 MG tablet Take 40 mg by mouth at bedtime.     Yes Historical Provider, MD  telmisartan (MICARDIS) 80 MG tablet Take 80 mg by mouth daily.     Yes Historical Provider, MD   Physical Exam: Filed Vitals:   04/04/13 1815 04/04/13 1830 04/04/13 1845 04/04/13 1900  BP: 125/63 123/67 115/61 129/72  Pulse: 77 92 83 83  Resp: 19 20 23 23   SpO2: 100% 100% 100% 100%     General:  AAF, no acute distress, lying on stretcher, pleasant, conversant, alert  Eyes:  PERRL, anicteric, non-injected.  ENT:  Nares clear.  OP clear, non-erythematous without plaques or exudates.  MMM.  Neck:  Supple without TM or JVD.    Lymph:  No cervical, supraclavicular, or submandibular LAD.  Cardiovascular:  RRR, normal S1, S2, without m/r/g.  2+ pulses, warm extremities  Respiratory:  Diminished at bilateral bases.  No obvious wheeze, rales, or rhonchi.  No increased WOB.  Abdomen:  Hyperactive BS.  Soft, mild TTP in the RLQ, RUQ and on epigastrium without rebound  or guarding.    Skin:  No rashes or focal lesions.  Musculoskeletal:  Normal bulk and tone.  No LE edema.    Psychiatric:  A & O x 4.  Appropriate affect.  Neurologic:  CN 3-12 intact.  5/5 strength.  Sensation intact.  Labs on Admission:  Basic Metabolic Panel:  Recent Labs Lab 04/04/13 1652  NA 136  K 4.6  CL 102  CO2 23  GLUCOSE 135*  BUN 23  CREATININE 1.01  CALCIUM 9.0   Liver Function Tests: No results found for this basename: AST, ALT, ALKPHOS, BILITOT, PROT, ALBUMIN,  in the last 168 hours No results found for this basename: LIPASE, AMYLASE,  in the last 168 hours No results found for this basename: AMMONIA,  in the last 168 hours CBC:  Recent Labs Lab 04/04/13 1652  WBC 15.2*  NEUTROABS 11.8*  HGB 10.5*  HCT 30.4*  MCV 80.2  PLT 201   Cardiac Enzymes: No results found for this basename: CKTOTAL, CKMB, CKMBINDEX, TROPONINI,  in the last 168 hours  BNP (last 3 results) No results found for this basename: PROBNP,  in the last 8760 hours CBG: No results found for this basename: GLUCAP,  in the last 168 hours  Radiological Exams on Admission: No results found.  Assessment/Plan Active Problems:   DIABETES MELLITUS, TYPE II   HYPERLIPIDEMIA   OBSTRUCTIVE SLEEP APNEA   HYPERTENSION   ASTHMA   Diverticulosis   Bright red blood per rectum   Blood loss anemia   Leukocytosis   Arthritis  Bright red blood per rectum:  Likely LGIB.  Patient had similar bleeding a year ago attributed to diverticulitis and she had emesis earlier today which was nonbloody which makes UGIB less likely.  Also, her BUN is not very elevated.  May have AVM or possibly infectious diarrhea, although less likely.  Possible that she had a duodenal ulcer or bleed.  The prednisone she started a week ago may have increased her risk of bleeding.   -  Admit to stepdown due to impending hemodynamic instability -  Telemetry -  Establish 2 lg bore PIV -  Repeat CBC in 6 hours (2200) and  again in AM -  Type and screen -  Transfuse for hemodynamic instability and active bleeding or hgb < 7mg /dl -  Consider GI or CCS consults if bleeding persists or worsens -  Hold nonessential oral medications -  C. Diff and stool culture  HTN/HLD, blood pressure low normal -  Metoprolol - will halve dose to prevent rebound tachycardia -  Hold ARB/statin  CAD, stable.  Asymptomatic -  ECG pending -  Hold aspirin  T2DM: -  Low dose SSI -  A1c in AM  Asthma, stable baseline wheeze.  Continue albuterol prn  Memory loss:  Hold aricept in acute setting  Rheumatoid arthritis:   -  D/c prednisone (was nearing end of taper anyway) -  MTX administered on Sundays  Leukocytosis, likely due to steroids -  UA pending -  CXR pending  Diet:  Clear liquids Access:  PIV  IVF:  NS at 66ml/h Proph:  SCDs  Code Status: full code Family Communication: spoke with patient and her son Disposition Plan: admit to stepdown  Time spent: 60 min  Renae Fickle [facility] Pager [phone]  If 7PM-7AM, please contact night-coverage [email or web address] Password [facility] 04/04/2013, 8:08 PM

## 2013-04-04 NOTE — ED Notes (Signed)
Per EMS:  Son took pt to Dr today b/c pt noticed blood in her stool this AM.  Dr's office called EMS.  Blood had soaked through pt's pants and pad at Dr's office.  Pt was initially confused, initial BP was 98/50.  Since then pt has come around, level of consciousness has improved, a&o x 4.  Pt has 22g L AC.  Pt also complained of some n/v prior to EMS' arrival, however no n/v now.  Pt took baby aspirin yesterday, reports none taken today.  Last CBG was 234.

## 2013-04-04 NOTE — ED Notes (Signed)
Admitting MD in with pt    

## 2013-04-04 NOTE — ED Provider Notes (Signed)
History     CSN: [id number]  Arrival date & time 04/04/13  1630   First MD Initiated Contact with Patient 04/04/13 1632      No chief complaint on file.   (Consider location/radiation/quality/duration/timing/severity/associated sxs/prior treatment) HPI  77 year old female with history of diverticulosis, chronic kidney disease, diabetes, and GI bleed requiring transfusion the past presents for evaluation of bright red blood per rectum. Patient reports she has been taking prednisone for the past 10 days for her rheumatoid arthritis. Today she has had multiple bouts of bright red blood per rectum when having bowel movement. She has had 3 or 4 bowel movement since this morning. Bleeding is soaking through several pads. Initially patient was reports having lightheadedness and are noted to be not confused per her son. Patient was brought to urgent care and initial blood pressure was 98/50. She became better. She reports having some mild nausea and has had 2 bouts of nonbloody, nonbilious vomit. She does endorse nausea. She denies fever, headache, neck pain, chest pain, shortness of breath, abdominal pain, back pain, dysuria. She denies melena, or rectal pain. Her last GI bleeding was due to diverticulosis in 2012. Patient does not take any blood thinner medication except she did take one baby aspirin yesterday.  Past Medical History  Diagnosis Date  . Complication of anesthesia     OCC HALLUCINATIONS  . S/P angioplasty     10/12  . Angina     INFREG  . Asthma     NO PROBLEMS RECENTLY  . Shortness of breath   . Sleep apnea     06  . GI bleed     12  4 UNITS OF BLOOD  . Blood transfusion     LATE 12 4 UNITS FOR GI BLEED  . Chronic kidney disease     STONES  . Hypertension   . Arthritis   . Diabetes mellitus     OFF JANUVIA AND HUMALOG SINCE NOV  . Anemia   . Glaucoma     Past Surgical History  Procedure Laterality Date  . Coronary angioplasty    . Abdominal hysterectomy    .  Hip arthroplasty      BIL  . Knee arthroplasty      LFT  . Eye surgery      CAT IOL LFT    No family history on file.  History  Substance Use Topics  . Smoking status: Never Smoker   . Smokeless tobacco: Not on file  . Alcohol Use: No    OB History   Grav Para Term Preterm Abortions TAB SAB Ect Mult Living                  Review of Systems  Constitutional:       10 Systems reviewed and all are negative for acute change except as noted in the HPI.     Allergies  Codeine  Home Medications   Current Outpatient Rx  Name  Route  Sig  Dispense  Refill  . albuterol (PROVENTIL HFA;VENTOLIN HFA) 108 (90 BASE) MCG/ACT inhaler   Inhalation   Inhale 2 puffs into the lungs every 6 (six) hours as needed. Shortness of breath          . Besifloxacin HCl 0.6 % SUSP   Left Eye   Place 1 drop into the left eye 2 (two) times daily.           . bimatoprost (LUMIGAN) 0.03 % ophthalmic solution  Both Eyes   Place 1 drop into both eyes at bedtime.           . brimonidine (ALPHAGAN) 0.15 % ophthalmic solution   Both Eyes   Place 1 drop into both eyes 2 (two) times daily.           . bromfenac (XIBROM) 0.09 % ophthalmic solution   Left Eye   Place 1 drop into the left eye every morning.           . donepezil (ARICEPT) 10 MG tablet   Oral   Take 10 mg by mouth at bedtime as needed.           . folic acid (FOLVITE) 1 MG tablet   Oral   Take 1 mg by mouth daily.           Marland Kitchen METHOTREXATE SODIUM LPF IJ   Injection   Inject 25 mg/day as directed 7 days. On Monday.         . metoprolol succinate (TOPROL-XL) 25 MG 24 hr tablet   Oral   Take 25 mg by mouth daily.           . nitroGLYCERIN (NITROSTAT) 0.4 MG SL tablet   Sublingual   Place 0.4 mg under the tongue every 5 (five) minutes as needed. Chest pain          . prednisoLONE acetate (PRED FORTE) 1 % ophthalmic suspension   Left Eye   Place 1 drop into the left eye 2 (two) times daily.            . predniSONE (DELTASONE) 5 MG tablet   Oral   Take 5 mg by mouth daily.           . simvastatin (ZOCOR) 40 MG tablet   Oral   Take 40 mg by mouth at bedtime.           Marland Kitchen telmisartan (MICARDIS) 80 MG tablet   Oral   Take 80 mg by mouth daily.             There were no vitals taken for this visit.  Physical Exam  Nursing note and vitals reviewed. Constitutional: She appears well-developed and well-nourished. No distress.  Awake, alert, nontoxic appearance  HENT:  Head: Atraumatic.  Mouth/Throat: Oropharynx is clear and moist.  Eyes: Conjunctivae and EOM are normal. Pupils are equal, round, and reactive to light. Right eye exhibits no discharge. Left eye exhibits no discharge.  Neck: Neck supple.  Cardiovascular: Normal rate and regular rhythm.   Pulmonary/Chest: Effort normal. No respiratory distress. She exhibits no tenderness.  Abdominal: Soft. There is no tenderness. There is no rebound.  Genitourinary: Guaiac positive stool.  Chaperone present  Bright Red Blood per rectum. No obvious external hemorrhoid noted. Normal rectum. No mass noted.  Musculoskeletal: She exhibits no tenderness.  ROM appears intact, no obvious focal weakness  Neurological: She is alert.  Mental status and motor strength appears intact  Skin: No rash noted.  Psychiatric: She has a normal mood and affect.    ED Course  Procedures (including critical care time)  Date: 04/04/2013  Rate: 77  Rhythm: normal sinus rhythm  QRS Axis: left  Intervals: PR prolonged  ST/T Wave abnormalities: nonspecific ST/T changes  Conduction Disutrbances:right bundle branch block and left anterior fascicular block  Narrative Interpretation:   Old EKG Reviewed: unchanged    4:50 PM Patient presents for evaluations of rectal bleeding. Bright red blood per rectum, as evident  on physical exam.  Abdomen nonsurgical.    5:52 PM Patient has elevated white count of 15.2, likely secondary to prednisone use. Her  hemoglobin is 10.5, staple. She has normal electrolytes. She has normal renal function.  6:50 PM Will consult for admission for further management of her rectal bleeding.  IVF given as her BP is low.    7:00 PM Pt currently stable, VSS.  I have consulted with [facility], Dr. Waldron Labs, who agrees to have pt admit to step down, team 2, under her care.  Pt is aware of plan.    Labs Reviewed  CBC WITH DIFFERENTIAL - Abnormal; Notable for the following:    WBC 15.2 (*)    RBC 3.79 (*)    Hemoglobin 10.5 (*)    HCT 30.4 (*)    Neutro Abs 11.8 (*)    All other components within normal limits  BASIC METABOLIC PANEL - Abnormal; Notable for the following:    Glucose, Bld 135 (*)    GFR calc non Af Amer 53 (*)    GFR calc Af Amer 62 (*)    All other components within normal limits   No results found.   1. GI bleed       MDM  BP 112/51  Pulse 76  Resp 20  SpO2 100%  I have reviewed nursing notes and vital signs. I personally reviewed the imaging tests through PACS system  I reviewed available ER/hospitalization records thought the EMR         Fayrene Helper, [place] 04/04/13 1905

## 2013-04-04 NOTE — ED Provider Notes (Signed)
Medical screening examination/treatment/procedure(s) were conducted as a shared visit with non-physician practitioner(s) and myself.  I personally evaluated the patient during the encounter  BRBPR, vitals normal, mild anemia. Admit for observation  Charles B. Bernette Mayers, MD 04/04/13 1907

## 2013-04-05 DIAGNOSIS — E119 Type 2 diabetes mellitus without complications: Secondary | ICD-10-CM

## 2013-04-05 LAB — CBC
HCT: 24.8 % — ABNORMAL LOW (ref 36.0–46.0)
HCT: 24.5 % — ABNORMAL LOW (ref 36.0–46.0)
Hemoglobin: 8.6 g/dL — ABNORMAL LOW (ref 12.0–15.0)
Hemoglobin: 8.5 g/dL — ABNORMAL LOW (ref 12.0–15.0)
MCHC: 34.7 g/dL (ref 30.0–36.0)
MCV: 80.8 fL (ref 78.0–100.0)
MCV: 80.9 fL (ref 78.0–100.0)
RDW: 13.9 % (ref 11.5–15.5)
WBC: 9.9 10*3/uL (ref 4.0–10.5)

## 2013-04-05 LAB — BASIC METABOLIC PANEL
BUN: 19 mg/dL (ref 6–23)
CO2: 24 mEq/L (ref 19–32)
Chloride: 109 mEq/L (ref 96–112)
Creatinine, Ser: 0.88 mg/dL (ref 0.50–1.10)
GFR calc Af Amer: 73 mL/min — ABNORMAL LOW (ref >90)
Potassium: 5.1 mEq/L (ref 3.5–5.1)

## 2013-04-05 LAB — HEMOGLOBIN A1C: Mean Plasma Glucose: 154 mg/dL — ABNORMAL HIGH (ref <117)

## 2013-04-05 LAB — GLUCOSE, CAPILLARY
Glucose-Capillary: 95 mg/dL (ref 70–99)
Glucose-Capillary: 133 mg/dL — ABNORMAL HIGH (ref 70–99)

## 2013-04-05 NOTE — Progress Notes (Signed)
Patient refuses nocturnal CPAP. She states she does have a machine at home, which she denies the use of. Education provided. She is encouraged to let RN or RT know if she should change her mind.

## 2013-04-05 NOTE — Progress Notes (Signed)
[facility] Progress Note [name] TEAM 1 - Stepdown/ICU TEAM   [name] UJW:[id number] DOB: [birth year] DOA: 04/04/2013 PCP: Willey Blade, MD  Brief narrative: The patient is a 77 y.o. year-old female with history of HTN, HLD, T2DM, arthritis, diverticulosis with previous bleeding about one year ago requiring 4 unit PRBC transfusion who presents with BRBPR. The patient was last at their baseline health yesterday. She states about a week ago, she saw her primary care doctor who started her on a steroid taper for her arthritis. Her joint pains improved. She has not been taking any NSAIDS for her arthritis "because they make me bleed." She had been having normal soft BMs regularly until this morning when she felt the sudden urge to defecate. She had 3-4 watery bright red bowel movements this morning at home. She was taken the urgent care by her family and during the visit she was incontinent of bright red liquid blood. She was transported to the ER where her blood pressures were low normal. Rectal exam demonstrated gross blood. Hemoglobin 10.5mg /dl down from 56.2 in 13/08. Labs were also notable for leukocytosis of 15.2.  She denies fevers, chills. She vomited several times at urgent care yellow beige material. Nonbilious, nonbloody. Denies abdominal pain. She felt a little lightheaded and felt like she was going to pass out at the urgent care, but since receiving IVF in the ER, she feels better.   Assessment/Plan: Principal Problem:   Bright red blood per rectum -h/o diverticulosis therefore high possibility this is diverticular -She states that she has undergone 2 colonoscopies in the past but cannot recall who performed them -At this time it appears that bleeding has resolved and therefore I will hold off on calling GI -Will continue clears for the next 24 hours and advance as long as no further bleeding occurs  Active Problems:   Blood loss anemia -Continue to monitor hemoglobin  every 12 hours and transfuse if it drops below 8    DIABETES MELLITUS, TYPE II -Continue low-dose sliding scale insulin   -A1c is pending    HYPERTENSION -Continue metoprolol at current reduced dose as further bleeding may result in a significant drop in blood pressure    OBSTRUCTIVE SLEEP APNEA -CPAP at bedtime    ASTHMA -Stable    Leukocytosis -Improving and either secondary to steroids or stress response -No signs of infection found    Rheumatoid Arthritis -Prednisone discontinued as she was nearing the end of her taper -Methotrexate administered on Sundays    Code Status: Full code Family Communication: Discussed with the Disposition Plan: Follow in step down  Consultants: None  Procedures: None  Antibiotics: None  DVT prophylaxis: SCDs  HPI/Subjective: Patient states that she has not had a bloody bowel movement since yesterday. No complaints of abdominal pain-she has had 2 colonoscopies in the past and has been told that she has diverticulosis. She's had similar bleeding in the past and was given 4 pints of blood at one point   Objective: Blood pressure 140/86, pulse 79, temperature 98.4 F (36.9 C), temperature source Oral, resp. rate 16, height 5' (1.524 m), weight 69.7 kg (153 lb 10.6 oz), SpO2 100.00%.  Intake/Output Summary (Last 24 hours) at 04/05/13 1457 Last data filed at 04/05/13 1200  Gross per 24 hour  Intake    660 ml  Output    950 ml  Net   -290 ml     Exam: General: Awake alert oriented x3, No acute respiratory distress Lungs: Clear  to auscultation bilaterally without wheezes or crackles Cardiovascular: Regular rate and rhythm without murmur gallop or rub normal S1 and S2 Abdomen: Nontender, nondistended, soft, bowel sounds positive, no rebound, no ascites, no appreciable mass Extremities: No significant cyanosis, clubbing, or edema bilateral lower extremities  Data Reviewed: Basic Metabolic Panel:  Recent Labs Lab  04/04/13 1652 04/05/13 0630  NA 136 139  K 4.6 5.1  CL 102 109  CO2 23 24  GLUCOSE 135* 96  BUN 23 19  CREATININE 1.01 0.88  CALCIUM 9.0 8.5   Liver Function Tests: No results found for this basename: AST, ALT, ALKPHOS, BILITOT, PROT, ALBUMIN,  in the last 168 hours No results found for this basename: LIPASE, AMYLASE,  in the last 168 hours No results found for this basename: AMMONIA,  in the last 168 hours CBC:  Recent Labs Lab 04/04/13 1652 04/04/13 2215 04/05/13 0630  WBC 15.2* 13.9* 12.5*  NEUTROABS 11.8*  --   --   HGB 10.5* 8.9* 8.6*  HCT 30.4* 25.4* 24.8*  MCV 80.2 80.1 80.8  PLT 201 202 180   Cardiac Enzymes: No results found for this basename: CKTOTAL, CKMB, CKMBINDEX, TROPONINI,  in the last 168 hours BNP (last 3 results) No results found for this basename: PROBNP,  in the last 8760 hours CBG:  Recent Labs Lab 04/04/13 2113 04/05/13 0812 04/05/13 1243  GLUCAP 107* 92 95    Recent Results (from the past 240 hour(s))  MRSA PCR SCREENING     Status: None   Collection Time    04/04/13  9:11 PM      Result Value Range Status   MRSA by PCR NEGATIVE  NEGATIVE Final   Comment:            The GeneXpert MRSA Assay (FDA     approved for NASAL specimens     only), is one component of a     comprehensive MRSA colonization     surveillance program. It is not     intended to diagnose MRSA     infection nor to guide or     monitor treatment for     MRSA infections.     Studies:  Recent x-ray studies have been reviewed in detail by the Attending Physician  Scheduled Meds:  Scheduled Meds: . bimatoprost  1 drop Both Eyes QHS  . brimonidine  1 drop Both Eyes BID  . insulin aspart  0-9 Units Subcutaneous TID WC  . [START ON 04/06/2013] methotrexate  25 mg Intramuscular Q7 days  . metoprolol succinate  12.5 mg Oral Daily  . sodium chloride  3 mL Intravenous Q12H   Continuous Infusions:   Time spent on care of this patient: 35  minutes   [facility]  [facility] Office  [phone] Pager - Text Page per Loretha Stapler as per below:  On-Call/Text Page:      [email or web address]      password [email or web address]  If 7PM-7AM, please contact night-coverage [email or web address] Password [email or web address] 04/05/2013, 2:57 PM   LOS: 1 day

## 2013-04-06 DIAGNOSIS — I10 Essential (primary) hypertension: Secondary | ICD-10-CM

## 2013-04-06 LAB — GLUCOSE, CAPILLARY: Glucose-Capillary: 102 mg/dL — ABNORMAL HIGH (ref 70–99)

## 2013-04-06 LAB — CBC
HCT: 23.5 % — ABNORMAL LOW (ref 36.0–46.0)
HCT: 26.5 % — ABNORMAL LOW (ref 36.0–46.0)
Hemoglobin: 8.3 g/dL — ABNORMAL LOW (ref 12.0–15.0)
Hemoglobin: 9.2 g/dL — ABNORMAL LOW (ref 12.0–15.0)
MCH: 28.4 pg (ref 26.0–34.0)
MCHC: 35.3 g/dL (ref 30.0–36.0)
MCV: 80.5 fL (ref 78.0–100.0)
MCV: 80.8 fL (ref 78.0–100.0)
Platelets: 173 10*3/uL (ref 150–400)
Platelets: 205 10*3/uL (ref 150–400)
RBC: 2.92 MIL/uL — ABNORMAL LOW (ref 3.87–5.11)
RBC: 3.28 MIL/uL — ABNORMAL LOW (ref 3.87–5.11)
RDW: 14.1 % (ref 11.5–15.5)
WBC: 7.1 10*3/uL (ref 4.0–10.5)
WBC: 13 10*3/uL — ABNORMAL HIGH (ref 4.0–10.5)

## 2013-04-06 LAB — BASIC METABOLIC PANEL
CO2: 24 mEq/L (ref 19–32)
Chloride: 106 mEq/L (ref 96–112)
Glucose, Bld: 95 mg/dL (ref 70–99)
Potassium: 3.9 mEq/L (ref 3.5–5.1)
Sodium: 138 mEq/L (ref 135–145)

## 2013-04-06 LAB — CLOSTRIDIUM DIFFICILE BY PCR: Toxigenic C. Difficile by PCR: NEGATIVE

## 2013-04-06 MED ORDER — METHOTREXATE SODIUM CHEMO INJECTION 25 MG/ML
25.0000 mg | INTRAMUSCULAR | Status: DC
  Administered 2013-04-06: 25 mg via SUBCUTANEOUS
  Filled 2013-04-06: qty 1

## 2013-04-06 MED ORDER — PNEUMOCOCCAL VAC POLYVALENT 25 MCG/0.5ML IJ INJ
0.5000 mL | INJECTION | INTRAMUSCULAR | Status: AC
  Administered 2013-04-07: 0.5 mL via INTRAMUSCULAR
  Filled 2013-04-06: qty 0.5

## 2013-04-06 NOTE — Progress Notes (Addendum)
[facility] Progress Note [place] TEAM 1 - Stepdown/ICU TEAM   [name] ZOX:[id number] DOB: 1936-01-29 DOA: 04/04/2013 PCP: Willey Blade, MD  Brief narrative: The patient is a 77 y.o. year-old female with history of HTN, HLD, T2DM, arthritis, diverticulosis with previous bleeding about one year ago requiring 4 unit PRBC transfusion who presents with BRBPR. The patient was last at their baseline health yesterday. She states about a week ago, she saw her primary care doctor who started her on a steroid taper for her arthritis. Her joint pains improved. She has not been taking any NSAIDS for her arthritis "because they make me bleed." She had been having normal soft BMs regularly until this morning when she felt the sudden urge to defecate. She had 3-4 watery bright red bowel movements this morning at home. She was taken the urgent care by her family and during the visit she was incontinent of bright red liquid blood. She was transported to the ER where her blood pressures were low normal. Rectal exam demonstrated gross blood. Hemoglobin 10.5mg /dl down from 81.1 in 91/47. Labs were also notable for leukocytosis of 15.2.  She denies fevers, chills. She vomited several times at urgent care yellow beige material. Nonbilious, nonbloody. Denies abdominal pain. She felt a little lightheaded and felt like she was going to pass out at the urgent care, but since receiving IVF in the ER, she feels better.   Assessment/Plan: Principal Problem:   Bright red blood per rectum -h/o diverticulosis therefore high possibility this is diverticular - ASA  On hold- she does not take it daily but took one tab about 2-3 days before the bleed starting -She states that she has undergone 2 colonoscopies in the past but cannot recall who performed them -At this time it appears that bleeding has resolved and therefore I will hold off on calling GI - pt had BM this AM- RN did not document and pt did not see it but AM  RN states that there were a few drops of blood -as hemoglobin remains stable I suspect bleeding has resolved -Will advance to full liquids today and regular diet at dinner if bleeding does not recur   Active Problems:   Blood loss anemia - 2 g drop noted since admission -Continue to monitor hemoglobin every 12 hours and transfuse if it drops below 8 - check anemia profile to ensure she has sufficient iron stores    DIABETES MELLITUS, TYPE II -Continue low-dose sliding scale insulin as we are steadily advancing diet   -A1c is 7    HYPERTENSION -Continue metoprolol at current reduced dose as further bleeding may result in a significant drop in blood pressure    OBSTRUCTIVE SLEEP APNEA -CPAP at bedtime    ASTHMA -Stable    Leukocytosis -Improving and either secondary to steroids or stress response -No signs of infection found    Rheumatoid Arthritis -Prednisone discontinued as she was nearing the end of her taper -Methotrexate administered on Sundays- will be receiving today    Code Status: Full code Family Communication: Discussed with the Disposition Plan: follow in step down for re-bleeding once diet resumed.  Consultants: None  Procedures: None  Antibiotics: None  DVT prophylaxis: SCDs  HPI/Subjective: Patient had a BM early this AM but did not look to see if it was bloody. No complaints.    Objective: Blood pressure 135/73, pulse 77, temperature 98.3 F (36.8 C), temperature source Oral, resp. rate 20, height 5' (1.524 m), weight 67.1 kg (  147 lb 14.9 oz), SpO2 100.00%.  Intake/Output Summary (Last 24 hours) at 04/06/13 0811 Last data filed at 04/05/13 2200  Gross per 24 hour  Intake    480 ml  Output    200 ml  Net    280 ml     Exam: General: Awake alert oriented x3, No acute respiratory distress Lungs: Clear to auscultation bilaterally without wheezes or crackles Cardiovascular: Regular rate and rhythm without murmur gallop or rub normal S1 and  S2 Abdomen: Nontender, nondistended, soft, bowel sounds positive, no rebound, no ascites, no appreciable mass Extremities: No significant cyanosis, clubbing, or edema bilateral lower extremities  Data Reviewed: Basic Metabolic Panel:  Recent Labs Lab 04/04/13 1652 04/05/13 0630 04/06/13 0630  NA 136 139 138  K 4.6 5.1 3.9  CL 102 109 106  CO2 23 24 24   GLUCOSE 135* 96 95  BUN 23 19 16   CREATININE 1.01 0.88 0.93  CALCIUM 9.0 8.5 8.5   Liver Function Tests: No results found for this basename: AST, ALT, ALKPHOS, BILITOT, PROT, ALBUMIN,  in the last 168 hours No results found for this basename: LIPASE, AMYLASE,  in the last 168 hours No results found for this basename: AMMONIA,  in the last 168 hours CBC:  Recent Labs Lab 04/04/13 1652 04/04/13 2215 04/05/13 0630 04/05/13 1649 04/06/13 0630  WBC 15.2* 13.9* 12.5* 9.9 7.1  NEUTROABS 11.8*  --   --   --   --   HGB 10.5* 8.9* 8.6* 8.5* 8.3*  HCT 30.4* 25.4* 24.8* 24.5* 23.5*  MCV 80.2 80.1 80.8 80.9 80.5  PLT 201 202 180 184 173   Cardiac Enzymes: No results found for this basename: CKTOTAL, CKMB, CKMBINDEX, TROPONINI,  in the last 168 hours BNP (last 3 results) No results found for this basename: PROBNP,  in the last 8760 hours CBG:  Recent Labs Lab 04/04/13 2113 04/05/13 0812 04/05/13 1243 04/05/13 1653 04/05/13 2059  GLUCAP 107* 92 95 111* 133*    Recent Results (from the past 240 hour(s))  MRSA PCR SCREENING     Status: None   Collection Time    04/04/13  9:11 PM      Result Value Range Status   MRSA by PCR NEGATIVE  NEGATIVE Final   Comment:            The GeneXpert MRSA Assay (FDA     approved for NASAL specimens     only), is one component of a     comprehensive MRSA colonization     surveillance program. It is not     intended to diagnose MRSA     infection nor to guide or     monitor treatment for     MRSA infections.     Studies:  Recent x-ray studies have been reviewed in detail by the  Attending Physician  Scheduled Meds:  Scheduled Meds: . bimatoprost  1 drop Both Eyes QHS  . brimonidine  1 drop Both Eyes BID  . insulin aspart  0-9 Units Subcutaneous TID WC  . methotrexate  25 mg Intramuscular Q7 days  . metoprolol succinate  12.5 mg Oral Daily  . sodium chloride  3 mL Intravenous Q12H   Continuous Infusions:   Time spent on care of this patient: 25 minutes   Calvert Cantor, MD  [facility] Office  [phone] Pager - Text Page per Loretha Stapler as per below:  On-Call/Text Page:      [email or web address]      password [facility]  If 7PM-7AM, please contact night-coverage [email or web address] Password [email or web address] 04/06/2013, 8:11 AM   LOS: 2 days

## 2013-04-06 NOTE — Progress Notes (Signed)
Pt refused to wear CPAP tonight.  RT will continue to monitor.  

## 2013-04-07 LAB — CBC
HCT: 21.9 % — ABNORMAL LOW (ref 36.0–46.0)
Hemoglobin: 7.6 g/dL — ABNORMAL LOW (ref 12.0–15.0)
MCH: 28.1 pg (ref 26.0–34.0)
MCHC: 34.7 g/dL (ref 30.0–36.0)
MCV: 81.1 fL (ref 78.0–100.0)
MCV: 81.2 fL (ref 78.0–100.0)
Platelets: 181 K/uL (ref 150–400)
Platelets: 179 10*3/uL (ref 150–400)
Platelets: 177 10*3/uL (ref 150–400)
RBC: 2.7 MIL/uL — ABNORMAL LOW (ref 3.87–5.11)
RBC: 2.55 MIL/uL — ABNORMAL LOW (ref 3.87–5.11)
RBC: 3.19 MIL/uL — ABNORMAL LOW (ref 3.87–5.11)
RDW: 14.5 % (ref 11.5–15.5)
RDW: 14.6 % (ref 11.5–15.5)
WBC: 8.9 K/uL (ref 4.0–10.5)
WBC: 8.9 10*3/uL (ref 4.0–10.5)
WBC: 9.6 10*3/uL (ref 4.0–10.5)

## 2013-04-07 LAB — BASIC METABOLIC PANEL WITH GFR
BUN: 19 mg/dL (ref 6–23)
CO2: 27 meq/L (ref 19–32)
Calcium: 8.6 mg/dL (ref 8.4–10.5)
Chloride: 108 meq/L (ref 96–112)
Creatinine, Ser: 1.08 mg/dL (ref 0.50–1.10)
GFR calc Af Amer: 57 mL/min — ABNORMAL LOW (ref >90)
GFR calc non Af Amer: 49 mL/min — ABNORMAL LOW (ref >90)
Glucose, Bld: 133 mg/dL — ABNORMAL HIGH (ref 70–99)
Potassium: 4.3 meq/L (ref 3.5–5.1)
Sodium: 140 meq/L (ref 135–145)

## 2013-04-07 LAB — GLUCOSE, CAPILLARY
Glucose-Capillary: 128 mg/dL — ABNORMAL HIGH (ref 70–99)
Glucose-Capillary: 137 mg/dL — ABNORMAL HIGH (ref 70–99)
Glucose-Capillary: 121 mg/dL — ABNORMAL HIGH (ref 70–99)
Glucose-Capillary: 122 mg/dL — ABNORMAL HIGH (ref 70–99)

## 2013-04-07 LAB — OCCULT BLOOD, POC DEVICE: Fecal Occult Bld: POSITIVE — AB

## 2013-04-07 LAB — PREPARE RBC (CROSSMATCH)

## 2013-04-07 MED ORDER — SIMVASTATIN 40 MG PO TABS
40.0000 mg | ORAL_TABLET | Freq: Every day | ORAL | Status: DC
  Administered 2013-04-07 – 2013-04-08 (×2): 40 mg via ORAL
  Filled 2013-04-07 (×3): qty 1

## 2013-04-07 MED ORDER — DONEPEZIL HCL 10 MG PO TABS
10.0000 mg | ORAL_TABLET | Freq: Every day | ORAL | Status: DC
  Administered 2013-04-07 – 2013-04-08 (×2): 10 mg via ORAL
  Filled 2013-04-07 (×3): qty 1

## 2013-04-07 MED ORDER — FOLIC ACID 1 MG PO TABS
1.0000 mg | ORAL_TABLET | Freq: Every day | ORAL | Status: DC
  Administered 2013-04-07 – 2013-04-09 (×3): 1 mg via ORAL
  Filled 2013-04-07 (×3): qty 1

## 2013-04-07 NOTE — Progress Notes (Signed)
Bilateral upper extremities assessed by 2 RNs for PIV insertion. No visible veins, IV team called back to ask about a time estimate because blood is ready. IV team estimate within the hour. Will cont to monitor. [name] L

## 2013-04-07 NOTE — Progress Notes (Signed)
Tip of IV catheter is out, IV team paged.  [name] L

## 2013-04-07 NOTE — Progress Notes (Addendum)
[facility] Progress Note [place] TEAM 1 - Stepdown/ICU TEAM   [name] UJW:[id number] DOB: December 11, 1936 DOA: 04/04/2013 PCP: Willey Blade, MD  Brief narrative: 77 y.o. year-old female with history of HTN, HLD, DM, arthritis, diverticulosis with previous bleeding about one year ago requiring 4 unit PRBC transfusion who presents with BRBPR. She stated about a week ago, she saw her primary care doctor who started her on a steroid taper for her arthritis. Her joint pains improved. She had not been taking any NSAIDS for her arthritis "because they make me bleed." She had been having normal soft BMs regularly until the morning of her admit when she felt the sudden urge to defecate. She had 3-4 watery bright red bowel movements. She was taken to an urgent care by her family and during the visit she was incontinent of bright red liquid blood. She was transported to the ER where her blood pressures were low normal. Rectal exam demonstrated gross blood. Hemoglobin 10.5mg /dl down from 56.2 in 13/08. Labs were also notable for leukocytosis of 15.2.  She denies fevers, chills. She vomited several times at urgent care yellow beige material. Nonbilious, nonbloody. Denied abdominal pain. She felt a little lightheaded and felt like she was going to pass out at the urgent care.   Assessment/Plan:  Bright red blood per rectum -h/o diverticulosis therefore high possibility this is diverticular -ASA on hold - she does not take it daily but took one tab about 2-3 days before the bleeding started -states that she has undergone 2 colonoscopies in the past but cannot recall who performed them -pt had recurrent bleeding spell 4/13 afternoon, but none since then  -Hgb dropped w/ rebleeding- continues to have a small amount of bleeding today -cont clears only and cont to follow -is hemodynamically stable   Acute Blood loss anemia - Hgb dropped significantly this morning - transfuse 1U PRBC today  -Continue  to monitor hemoglobin every 12 hours and transfuse again if it drops below 8 - check anemia profile to ensure she has sufficient iron stores  DIABETES MELLITUS, TYPE II -Continue low-dose sliding scale insulin  -A1c is 7  HYPERTENSION -Continue metoprolol at reduced dose as further bleeding may result in a significant drop in blood pressure  OBSTRUCTIVE SLEEP APNEA -CPAP at bedtime  ASTHMA -Stable  Leukocytosis -Improving -secondary to steroids or stress response -No signs of infection found  Rheumatoid Arthritis -Prednisone discontinued as she was nearing the end of her taper -Methotrexate administered on Sundays   Code Status: Full code Family Communication: Discussed with the patient  Disposition Plan: hemodynamically stable for transfer to med bed  Consultants: None  Procedures: None  Antibiotics: None  DVT prophylaxis: SCDs  HPI/Subjective: Patient is alert and pleasant.  She had another BM today with a small amount of blood.   Objective: Blood pressure 102/56, pulse 87, temperature 97.5 F (36.4 C), temperature source Oral, resp. rate 22, height 5' (1.524 m), weight 61 kg (134 lb 7.7 oz), SpO2 100.00%.  Intake/Output Summary (Last 24 hours) at 04/07/13 1255 Last data filed at 04/07/13 0959  Gross per 24 hour  Intake    963 ml  Output    200 ml  Net    763 ml    Exam: General: Awake alert oriented x3, No acute respiratory distress Lungs: Clear to auscultation bilaterally without wheezes or crackles Cardiovascular: Regular rate and rhythm without murmur gallop or rub  Abdomen: Nontender, nondistended, soft, bowel sounds positive, no rebound, no ascites,  no appreciable mass Extremities: No significant cyanosis, clubbing, or edema bilateral lower extremities  Data Reviewed: Basic Metabolic Panel:  Recent Labs Lab 04/04/13 1652 04/05/13 0630 04/06/13 0630 04/07/13 0545  NA 136 139 138 140  K 4.6 5.1 3.9 4.3  CL 102 109 106 108  CO2 23 24 24  27   GLUCOSE 135* 96 95 133*  BUN 23 19 16 19   CREATININE 1.01 0.88 0.93 1.08  CALCIUM 9.0 8.5 8.5 8.6   CBC:  Recent Labs Lab 04/04/13 1652  04/05/13 0630 04/05/13 1649 04/06/13 0630 04/06/13 1642 04/07/13 0545  WBC 15.2*  < > 12.5* 9.9 7.1 13.0* 8.9  NEUTROABS 11.8*  --   --   --   --   --   --   HGB 10.5*  < > 8.6* 8.5* 8.3* 9.2* 7.6*  HCT 30.4*  < > 24.8* 24.5* 23.5* 26.5* 21.9*  MCV 80.2  < > 80.8 80.9 80.5 80.8 81.1  PLT 201  < > 180 184 173 205 181  < > = values in this interval not displayed.  CBG:  Recent Labs Lab 04/06/13 1202 04/06/13 1650 04/06/13 2105 04/07/13 0731 04/07/13 1232  GLUCAP 102* 92 177* 128* 137*    Recent Results (from the past 240 hour(s))  MRSA PCR SCREENING     Status: None   Collection Time    04/04/13  9:11 PM      Result Value Range Status   MRSA by PCR NEGATIVE  NEGATIVE Final   Comment:            The GeneXpert MRSA Assay (FDA     approved for NASAL specimens     only), is one component of a     comprehensive MRSA colonization     surveillance program. It is not     intended to diagnose MRSA     infection nor to guide or     monitor treatment for     MRSA infections.  STOOL CULTURE     Status: None   Collection Time    04/06/13 11:15 AM      Result Value Range Status   Specimen Description STOOL   Final   Special Requests NONE   Final   Culture Culture reincubated for better growth   Final   Report Status PENDING   Incomplete  CLOSTRIDIUM DIFFICILE BY PCR     Status: None   Collection Time    04/06/13 11:15 AM      Result Value Range Status   C difficile by pcr NEGATIVE  NEGATIVE Final     Studies:  Recent x-ray studies have been reviewed in detail by the Attending Physician  Scheduled Meds:  Scheduled Meds: . bimatoprost  1 drop Both Eyes QHS  . brimonidine  1 drop Both Eyes BID  . insulin aspart  0-9 Units Subcutaneous TID WC  . methotrexate  25 mg Subcutaneous Weekly  . metoprolol succinate  12.5 mg  Oral Daily  . sodium chloride  3 mL Intravenous Q12H   Continuous Infusions:   Time spent on care of this patient: 35 minutes   MCCLUNG,JEFFREY T, MD  [facility] Office  [phone] Pager - Text Page per Loretha Stapler as per below:  On-Call/Text Page:      [email or web address]      password [email or web address]  If 7PM-7AM, please contact night-coverage [email or web address] Password [facility] 04/07/2013, 12:55 PM   LOS: 3 days     I have examined the patient, reviewed  the chart and modified the above note which I agree with.   Keilly Fatula,MD [phone] 04/08/2013, 7:29 PM

## 2013-04-07 NOTE — Progress Notes (Signed)
Utilization Review Completed.  04/07/2013

## 2013-04-07 NOTE — Progress Notes (Signed)
Spoke with pt about cpap tonight.  Pt stated she does wear one at home but does not want to wear it tonight.  Pt was advised that RT available all night should she change her mind to let her nurse know.

## 2013-04-08 LAB — CBC
Hemoglobin: 8.3 g/dL — ABNORMAL LOW (ref 12.0–15.0)
Hemoglobin: 9.8 g/dL — ABNORMAL LOW (ref 12.0–15.0)
MCV: 80.7 fL (ref 78.0–100.0)
Platelets: 173 10*3/uL (ref 150–400)
RBC: 3.05 MIL/uL — ABNORMAL LOW (ref 3.87–5.11)
RBC: 3.5 MIL/uL — ABNORMAL LOW (ref 3.87–5.11)
WBC: 8.2 10*3/uL (ref 4.0–10.5)

## 2013-04-08 LAB — TYPE AND SCREEN
ABO/RH(D): O POS
Antibody Screen: NEGATIVE
Unit division: 0

## 2013-04-08 LAB — BASIC METABOLIC PANEL
Chloride: 108 mEq/L (ref 96–112)
Creatinine, Ser: 1 mg/dL (ref 0.50–1.10)
GFR calc Af Amer: 63 mL/min — ABNORMAL LOW (ref >90)
Potassium: 3.7 mEq/L (ref 3.5–5.1)
Sodium: 140 mEq/L (ref 135–145)

## 2013-04-08 LAB — GLUCOSE, CAPILLARY
Glucose-Capillary: 106 mg/dL — ABNORMAL HIGH (ref 70–99)
Glucose-Capillary: 101 mg/dL — ABNORMAL HIGH (ref 70–99)
Glucose-Capillary: 103 mg/dL — ABNORMAL HIGH (ref 70–99)
Glucose-Capillary: 142 mg/dL — ABNORMAL HIGH (ref 70–99)

## 2013-04-08 LAB — FOLATE: Folate: >20 ng/mL

## 2013-04-08 LAB — RETICULOCYTES: Retic Ct Pct: 2.5 % (ref 0.4–3.1)

## 2013-04-08 LAB — VITAMIN B12: Vitamin B-12: 685 pg/mL (ref 211–911)

## 2013-04-08 LAB — IRON AND TIBC: TIBC: 258 ug/dL (ref 250–470)

## 2013-04-08 MED ORDER — BIMATOPROST 0.01 % OP SOLN
1.0000 [drp] | Freq: Every day | OPHTHALMIC | Status: DC
  Administered 2013-04-08: 1 [drp] via OPHTHALMIC
  Filled 2013-04-08: qty 2.5

## 2013-04-08 NOTE — Progress Notes (Signed)
Attempted to call report x 1. [name]  

## 2013-04-08 NOTE — Clinical Documentation Improvement (Signed)
TEST, TEST, TEST!!!!   Anemia Blood Loss Clarification  THIS DOCUMENT IS NOT A PERMANENT PART OF THE MEDICAL RECORD  RESPOND TO THE THIS QUERY, FOLLOW THE INSTRUCTIONS BELOW:  1. If needed, update documentation for the patient's encounter via the notes activity.  2. Access this query again and click edit on the In [employer].  3. After updating, or not, click F2 to complete all highlighted (required) fields concerning your review. Select "additional documentation in the medical record" OR "no additional documentation provided".  4. Click Sign note button.  5. The deficiency will fall out of your In Basket *Please let us know if you are not able to complete this workflow by phone or e-mail (listed below).        04/08/13  Dear Deetta Perla Marton Redwood  In an effort to better capture your patient's severity of illness, reflect appropriate length of stay and utilization of resources, a review of the patient medical record has revealed the following indicators.    Based on your clinical judgment, please clarify and document in a progress note and/or discharge summary the clinical condition associated with the following supporting information:  In responding to this query please exercise your independent judgment.  The fact that a query is asked, does not imply that any particular answer is desired or expected.  Possible Clinical Conditions?   " Expected Acute Blood Loss Anemia  " Acute Blood Loss Anemia " Acute on chronic blood loss anemia  " Chronic blood loss anemia  " Precipitous drop in Hematocrit  " Other Condition________________  " Cannot Clinically Determine  Risk Factors: (recent surgery, pre op anemia, EBL in OR)  Supporting Information:  Signs and Symptoms (unable to ambulate, weakness, dizziness, unable to participate in care)  Diagnostics: H&H on admit: Pre-OP H&H: Post OP H&H: Current H&H: H&H after TX: Treatments: Transfusion: IV fluids / plasma  expanders: Serial H&H monitoring Medications (Fe, Procrit)  Reviewed: TEST  Thank You,  Carmon Ginsberg  Clinical Documentation Specialist:  Pager  Health Information Management [place]

## 2013-04-08 NOTE — Progress Notes (Signed)
Report given to Misty Stanley, [place] RN. Pt to transfer to [id number] via wheelchair, no O2, no IVFs. Meds in chart, belongings at bedside. Pt updated family on room change. VS stable at time of transfer. No current questions or complaints. Holdan Stucke L

## 2013-04-08 NOTE — Progress Notes (Signed)
NURSING PROGRESS NOTE  [name] [id number] Transfer Data: 04/08/2013 4:16 PM Attending Provider: Calvert Cantor, MD HYQ:MVHQ, ERIC, MD Code Status: full  [name] is a 77 y.o. female patient transferred from 64 -No acute distress noted.  -No complaints of shortness of breath.  -No complaints of chest pain.   Blood pressure 134/83, pulse 65, temperature 98.1 F (36.7 C), temperature source Oral, resp. rate 16, height 5\' 1"  (1.549 m), weight 65.726 kg (144 lb 14.4 oz), SpO2 99.00%.   IV Fluids:  IV in place, SL  Allergies:  Codeine  Past Medical History:   has a past medical history of Complication of anesthesia; S/P angioplasty; Angina; Asthma; Shortness of breath; Sleep apnea; GI bleed; Blood transfusion; Chronic kidney disease; Hypertension; Arthritis; Diabetes mellitus; Anemia; Glaucoma(365); Kidney stone; CAD (coronary artery disease); and Memory loss.  Past Surgical History:   has past surgical history that includes Coronary angioplasty; Abdominal hysterectomy; Hip Arthroplasty; Knee Arthroplasty; and Eye surgery.  Social History:   reports that she has never smoked. She does not have any smokeless tobacco history on file. She reports that she does not drink alcohol or use illicit drugs.  Skin: warm dry and intact  Patient/Family orientated to room. Information packet given to patient/family. Admission inpatient armband information verified with patient/family to include name and date of birth and placed on patient arm. Side rails up x 2, fall assessment and education completed with patient/family. Patient/family able to verbalize understanding of risk associated with falls and verbalized understanding to call for assistance before getting out of bed. Call light within reach. Patient/family able to voice and demonstrate understanding of unit orientation instructions.    Will continue to evaluate and treat per MD orders.

## 2013-04-08 NOTE — Progress Notes (Signed)
Patient refused CPAP. Patient states that she wears a CPAP at home, but doesn't want to wear CPAP at night in hospital. Will continue to monitor patient. Nelda Marseille, RN

## 2013-04-09 LAB — CBC
MCH: 28.2 pg (ref 26.0–34.0)
Platelets: 207 10*3/uL (ref 150–400)
RBC: 3.08 MIL/uL — ABNORMAL LOW (ref 3.87–5.11)
WBC: 7 10*3/uL (ref 4.0–10.5)

## 2013-04-09 LAB — BASIC METABOLIC PANEL
Calcium: 8.9 mg/dL (ref 8.4–10.5)
GFR calc non Af Amer: 58 mL/min — ABNORMAL LOW (ref >90)
Sodium: 140 mEq/L (ref 135–145)

## 2013-04-09 LAB — GLUCOSE, CAPILLARY: Glucose-Capillary: 101 mg/dL — ABNORMAL HIGH (ref 70–99)

## 2013-04-09 MED ORDER — ASPIRIN 81 MG PO TABS: 81.0000 mg | ORAL_TABLET | Freq: Every day | ORAL | Status: DC

## 2013-04-09 NOTE — Discharge Summary (Signed)
Physician Discharge Summary  [name] ZOX:[id number] DOB: 1936-02-24 DOA: 04/04/2013  PCP: Willey Blade, MD  Admit date: 04/04/2013 Discharge date: 04/09/2013  Time spent: 45 minutes  Recommendations for Outpatient Follow-up:  Patient to follow up with [facility], Dr. Charlott Rakes for lower GI bleeding.  The [facility] is to call [name] with an appointment.    Request PCP follow up for cbc check, diabetic management in 1-2 weeks.  Discharge Diagnoses:  Principal Problem:   Bright red blood per rectum Active Problems:   DIABETES MELLITUS, TYPE II   HYPERLIPIDEMIA   OBSTRUCTIVE SLEEP APNEA   HYPERTENSION   ASTHMA   Diverticulosis   Blood loss anemia   Leukocytosis   Arthritis   Discharge Condition: stable, no bleeding for 48 hours.  Tolerating solid diet.  Diet recommendation: bland soft diet.  Filed Weights   04/07/13 0537 04/08/13 0016 04/08/13 1604  Weight: 61 kg (134 lb 7.7 oz) 63.1 kg (139 lb 1.8 oz) 65.726 kg (144 lb 14.4 oz)    History of present illness:  The patient is a 77 y.o. year-old female with history of HTN, HLD, T2DM, arthritis, diverticulosis with previous bleeding about one year ago requiring 4 unit PRBC transfusion who presents with BRBPR. The patient was last at their baseline health yesterday. She states about a week ago, she saw her primary care doctor who started her on a steroid taper for her arthritis. Her joint pains improved. She has not been taking any NSAIDS for her arthritis "because they make me bleed." She had been having normal soft BMs regularly until this morning when she felt the sudden urge to defecate. She had 3-4 watery bright red bowel movements this morning at home. She was taken the urgent care by her family and during the visit she was incontinent of bright red liquid blood. She was transported to the ER where her blood pressures were low normal. Rectal exam demonstrated gross blood. Hemoglobin 10.5mg /dl down from 81.1 in 91/47.  Labs were also notable for leukocytosis of 15.2.    Hospital Course:  Bright red blood per rectum with Acute Blood loss anemia  [name] has a history of diverticulosis therefore there is high possibility this is diverticular.  The patient's aspirin was held.  We have asked her to resume taking it on 4/19 (5 days after she last bled).  The patient had a second episode of bleeding in the hospital and dropped her hgb.  She was transfused 2 units of PRBCs during this admission.  Hgb at discharge is 8.7.  Iron panel shows sufficient iron and ferritin.  DIABETES MELLITUS, TYPE II  A1c is 7.  Diet Controlled.  We will request her pcp follow up as appropriate.  HYPERTENSION  Continue metoprolol.  OBSTRUCTIVE SLEEP APNEA  CPAP at bedtime   ASTHMA  Stable   Leukocytosis  Was likely due to steroids.  Returned to normal during the admission.  No signs of infection.  Rheumatoid Arthritis  Prednisone discontinued as she was nearing the end of her taper.  Methotrexate administered on Sundays.   Discharge Exam: Filed Vitals:   04/08/13 1604 04/08/13 2118 04/09/13 0528 04/09/13 0915  BP: 134/83 125/70 124/75 133/73  Pulse: 65 68 76 74  Temp: 98.1 F (36.7 C) 98.4 F (36.9 C) 97.7 F (36.5 C)   TempSrc: Oral Oral Oral   Resp: 16 18 20    Height: 5\' 1"  (1.549 m)     Weight: 65.726 kg (144 lb 14.4  oz)     SpO2: 99% 97% 100%     General: 11, 71 77 yo female, pleasant, nad HEENT:  Conjunctiva pink, nucous membranes moist Cardiovascular: rrr  No m/r/g, no lower extremity edema Respiratory: cta, no w/c/r, no accessory muscle use Abdomen: soft, nt, nd, +bs, no masses Skin:  No rashes, bruises, lesions, no pallor  Discharge Instructions  Discharge Orders   Future Orders Complete By Expires     Diet - low sodium heart healthy  As directed     Increase activity slowly  As directed         Medication List    STOP taking these medications       predniSONE 5 MG Tabs  Commonly known  as:  STERAPRED UNI-PAK      TAKE these medications       albuterol 108 (90 BASE) MCG/ACT inhaler  Commonly known as:  PROVENTIL HFA;VENTOLIN HFA  Inhale 2 puffs into the lungs every 6 (six) hours as needed. Shortness of breath     aspirin 81 MG tablet  Take 1 tablet (81 mg total) by mouth daily. RESTART Aspirin on 04/12/2013 if you have no further bleeding.  Start taking on:  04/12/2013     bimatoprost 0.03 % ophthalmic solution  Commonly known as:  LUMIGAN  Place 1 drop into both eyes at bedtime.     brimonidine 0.15 % ophthalmic solution  Commonly known as:  ALPHAGAN  Place 1 drop into both eyes 2 (two) times daily.     donepezil 10 MG tablet  Commonly known as:  ARICEPT  Take 10 mg by mouth at bedtime.     folic acid 1 MG tablet  Commonly known as:  FOLVITE  Take 1 mg by mouth daily.     methotrexate 25 MG/ML Soln  Inject 25 mg as directed every 7 (seven) days. Takes on Sunday     metoprolol succinate 25 MG 24 hr tablet  Commonly known as:  TOPROL-XL  Take 25 mg by mouth daily.     nitroGLYCERIN 0.4 MG SL tablet  Commonly known as:  NITROSTAT  Place 0.4 mg under the tongue every 5 (five) minutes as needed for chest pain. x3 doses as needed for chest pain     simvastatin 40 MG tablet  Commonly known as:  ZOCOR  Take 40 mg by mouth at bedtime.     telmisartan 80 MG tablet  Commonly known as:  MICARDIS  Take 80 mg by mouth daily.           Follow-up Information   Follow up with August Saucer, ERIC, MD. Schedule an appointment as soon as possible for a visit in 2 weeks.   Contact information:   [facility] [address] [place] [address] [phone]        The results of significant diagnostics from this hospitalization (including imaging, microbiology, ancillary and laboratory) are listed below for reference.    Significant Diagnostic Studies: Dg Chest Port 1 View  04/04/2013  *RADIOLOGY REPORT*  Clinical Data: Wheezing.   Leukocytosis.  PORTABLE CHEST - 1 VIEW  Comparison: 09/12/2011  Findings: Heart size within normal limits in stable.  Both lungs are clear.  No pneumothorax or pleural effusion identified.  IMPRESSION: No active disease.   Original Report Authenticated By: Myles Rosenthal, M.D.     Microbiology: Recent Results (from the past 240 hour(s))  MRSA PCR SCREENING     Status: None   Collection Time  04/04/13  9:11 PM      Result Value Range Status   MRSA by PCR NEGATIVE  NEGATIVE Final   Comment:            The GeneXpert MRSA Assay (FDA     approved for NASAL specimens     only), is one component of a     comprehensive MRSA colonization     surveillance program. It is not     intended to diagnose MRSA     infection nor to guide or     monitor treatment for     MRSA infections.  STOOL CULTURE     Status: None   Collection Time    04/06/13 11:15 AM      Result Value Range Status   Specimen Description STOOL   Final   Special Requests NONE   Final   Culture NO SUSPICIOUS COLONIES, CONTINUING TO HOLD   Final   Report Status PENDING   Incomplete  CLOSTRIDIUM DIFFICILE BY PCR     Status: None   Collection Time    04/06/13 11:15 AM      Result Value Range Status   C difficile by pcr NEGATIVE  NEGATIVE Final     Labs: Basic Metabolic Panel:  Recent Labs Lab 04/05/13 0630 04/06/13 0630 04/07/13 0545 04/08/13 0405 04/09/13 0526  NA 139 138 140 140 140  K 5.1 3.9 4.3 3.7 3.9  CL 109 106 108 108 108  CO2 24 24 27 25 25   GLUCOSE 96 95 133* 120* 91  BUN 19 16 19 17 11   CREATININE 0.88 0.93 1.08 1.00 0.94  CALCIUM 8.5 8.5 8.6 8.6 8.9   CBC:  Recent Labs Lab 04/04/13 1652  04/07/13 1651 04/07/13 2037 04/08/13 0405 04/08/13 1702 04/09/13 0526  WBC 15.2*  < > 8.9 9.6 8.2 8.2 7.0  NEUTROABS 11.8*  --   --   --   --   --   --   HGB 10.5*  < > 7.3* 9.0* 8.3* 9.8* 8.7*  HCT 30.4*  < > 20.7* 25.6* 24.6* 28.2* 25.0*  MCV 80.2  < > 81.2 80.3 80.7 80.6 81.2  PLT 201  < > 179 177 173  212 207  < > = values in this interval not displayed.  CBG:  Recent Labs Lab 04/08/13 1202 04/08/13 1639 04/08/13 2140 04/09/13 0807 04/09/13 1209  GLUCAP 101* 103* 142* 93 101*    Signed:  Conley Canal [facility] 04/09/2013, 4:05 PM

## 2013-04-09 NOTE — Discharge Summary (Signed)
Addendum  Patient seen and examined, chart and data base reviewed.  I agree with the above assessment and discharge plan.  For full details please see Mrs. Algis Downs PA note.  Lower GI bleed, likely diverticular to see [facility] as out patient.   Clint Lipps, MD [facility] Pager: [phone] 04/09/2013, 10:44 PM

## 2013-04-09 NOTE — Progress Notes (Signed)
[name] to be D/C'd Home per MD order.  Discussed with the patient and all questions fully answered.    Medication List    STOP taking these medications       predniSONE 5 MG Tabs  Commonly known as:  STERAPRED UNI-PAK      TAKE these medications       albuterol 108 (90 BASE) MCG/ACT inhaler  Commonly known as:  PROVENTIL HFA;VENTOLIN HFA  Inhale 2 puffs into the lungs every 6 (six) hours as needed. Shortness of breath     aspirin 81 MG tablet  Take 1 tablet (81 mg total) by mouth daily. RESTART Aspirin on 04/12/2013 if you have no further bleeding.  Start taking on:  04/12/2013     bimatoprost 0.03 % ophthalmic solution  Commonly known as:  LUMIGAN  Place 1 drop into both eyes at bedtime.     brimonidine 0.15 % ophthalmic solution  Commonly known as:  ALPHAGAN  Place 1 drop into both eyes 2 (two) times daily.     donepezil 10 MG tablet  Commonly known as:  ARICEPT  Take 10 mg by mouth at bedtime.     folic acid 1 MG tablet  Commonly known as:  FOLVITE  Take 1 mg by mouth daily.     methotrexate 25 MG/ML Soln  Inject 25 mg as directed every 7 (seven) days. Takes on Sunday     metoprolol succinate 25 MG 24 hr tablet  Commonly known as:  TOPROL-XL  Take 25 mg by mouth daily.     nitroGLYCERIN 0.4 MG SL tablet  Commonly known as:  NITROSTAT  Place 0.4 mg under the tongue every 5 (five) minutes as needed for chest pain. x3 doses as needed for chest pain     simvastatin 40 MG tablet  Commonly known as:  ZOCOR  Take 40 mg by mouth at bedtime.     telmisartan 80 MG tablet  Commonly known as:  MICARDIS  Take 80 mg by mouth daily.        VVS, Skin clean, dry and intact without evidence of skin break down, no evidence of skin tears noted. IV catheter discontinued intact. Site without signs and symptoms of complications. Dressing and pressure applied.  An After Visit Summary was printed and given to the patient. Patient escorted via WC, and D/C home via  private auto.  Kennyth Arnold D 04/09/2013 12:21 PM

## 2013-04-09 NOTE — Progress Notes (Signed)
Pt continues to refuse CPAP.  States she does not wish to be asked or bothered with this matter anymore.

## 2013-04-10 LAB — STOOL CULTURE

## 2013-10-25 HISTORY — PX: OTHER SURGICAL HISTORY: SHX169

## 2013-11-03 ENCOUNTER — Emergency Department (HOSPITAL_COMMUNITY): Payer: Medicare PPO

## 2013-11-03 ENCOUNTER — Encounter (HOSPITAL_COMMUNITY): Payer: Self-pay | Admitting: Emergency Medicine

## 2013-11-03 ENCOUNTER — Inpatient Hospital Stay (HOSPITAL_COMMUNITY)
Admission: EM | Admit: 2013-11-03 | Discharge: 2013-11-07 | DRG: 040 | Disposition: A | Discharge status: Skilled Nursing Facility | Payer: Medicare PPO | Attending: Internal Medicine | Admitting: Internal Medicine

## 2013-11-03 DIAGNOSIS — D649 Anemia, unspecified: Secondary | ICD-10-CM | POA: Diagnosis present

## 2013-11-03 DIAGNOSIS — I2510 Atherosclerotic heart disease of native coronary artery without angina pectoris: Secondary | ICD-10-CM | POA: Diagnosis present

## 2013-11-03 DIAGNOSIS — I639 Cerebral infarction, unspecified: Secondary | ICD-10-CM | POA: Diagnosis present

## 2013-11-03 DIAGNOSIS — R4701 Aphasia: Secondary | ICD-10-CM

## 2013-11-03 DIAGNOSIS — J45909 Unspecified asthma, uncomplicated: Secondary | ICD-10-CM

## 2013-11-03 DIAGNOSIS — Z9861 Coronary angioplasty status: Secondary | ICD-10-CM

## 2013-11-03 DIAGNOSIS — R269 Unspecified abnormalities of gait and mobility: Secondary | ICD-10-CM

## 2013-11-03 DIAGNOSIS — K5790 Diverticulosis of intestine, part unspecified, without perforation or abscess without bleeding: Secondary | ICD-10-CM

## 2013-11-03 DIAGNOSIS — I6340 Cerebral infarction due to embolism of unspecified cerebral artery: Principal | ICD-10-CM | POA: Diagnosis present

## 2013-11-03 DIAGNOSIS — M1990 Unspecified osteoarthritis, unspecified site: Secondary | ICD-10-CM

## 2013-11-03 DIAGNOSIS — E785 Hyperlipidemia, unspecified: Secondary | ICD-10-CM | POA: Diagnosis present

## 2013-11-03 DIAGNOSIS — Z7982 Long term (current) use of aspirin: Secondary | ICD-10-CM

## 2013-11-03 DIAGNOSIS — Z79899 Other long term (current) drug therapy: Secondary | ICD-10-CM

## 2013-11-03 DIAGNOSIS — Z96649 Presence of unspecified artificial hip joint: Secondary | ICD-10-CM

## 2013-11-03 DIAGNOSIS — G9340 Encephalopathy, unspecified: Secondary | ICD-10-CM | POA: Diagnosis present

## 2013-11-03 DIAGNOSIS — D72829 Elevated white blood cell count, unspecified: Secondary | ICD-10-CM

## 2013-11-03 DIAGNOSIS — E119 Type 2 diabetes mellitus without complications: Secondary | ICD-10-CM | POA: Diagnosis present

## 2013-11-03 DIAGNOSIS — E860 Dehydration: Secondary | ICD-10-CM | POA: Diagnosis present

## 2013-11-03 DIAGNOSIS — N179 Acute kidney failure, unspecified: Secondary | ICD-10-CM | POA: Diagnosis present

## 2013-11-03 DIAGNOSIS — H5440 Blindness, one eye, unspecified eye: Secondary | ICD-10-CM | POA: Diagnosis present

## 2013-11-03 DIAGNOSIS — I10 Essential (primary) hypertension: Secondary | ICD-10-CM | POA: Diagnosis present

## 2013-11-03 DIAGNOSIS — H409 Unspecified glaucoma: Secondary | ICD-10-CM | POA: Diagnosis present

## 2013-11-03 DIAGNOSIS — G4733 Obstructive sleep apnea (adult) (pediatric): Secondary | ICD-10-CM

## 2013-11-03 HISTORY — DX: Diverticulosis of intestine, part unspecified, without perforation or abscess without bleeding: K57.90

## 2013-11-03 HISTORY — DX: Rheumatoid arthritis, unspecified: M06.9

## 2013-11-03 LAB — CBC
HCT: 36.3 % (ref 36.0–46.0)
Hemoglobin: 12.4 g/dL (ref 12.0–15.0)
MCHC: 35.1 g/dL (ref 30.0–36.0)
MCHC: 34.2 g/dL (ref 30.0–36.0)
MCV: 84 fL (ref 78.0–100.0)
Platelets: 237 10*3/uL (ref 150–400)
Platelets: 202 10*3/uL (ref 150–400)
RBC: 4.32 MIL/uL (ref 3.87–5.11)
RDW: 15.8 % — ABNORMAL HIGH (ref 11.5–15.5)
RDW: 15.8 % — ABNORMAL HIGH (ref 11.5–15.5)
WBC: 8.6 10*3/uL (ref 4.0–10.5)

## 2013-11-03 LAB — GLUCOSE, CAPILLARY

## 2013-11-03 LAB — URINALYSIS, ROUTINE W REFLEX MICROSCOPIC
Bilirubin Urine: NEGATIVE
Glucose, UA: NEGATIVE mg/dL
Ketones, ur: NEGATIVE mg/dL
Specific Gravity, Urine: 1.013 (ref 1.005–1.030)
pH: 6 (ref 5.0–8.0)

## 2013-11-03 LAB — COMPREHENSIVE METABOLIC PANEL
ALT: 13 U/L (ref 0–35)
Albumin: 4.2 g/dL (ref 3.5–5.2)
Alkaline Phosphatase: 118 U/L — ABNORMAL HIGH (ref 39–117)
Calcium: 9.8 mg/dL (ref 8.4–10.5)
Potassium: 3.7 mEq/L (ref 3.5–5.1)
Sodium: 139 mEq/L (ref 135–145)
Total Protein: 8.1 g/dL (ref 6.0–8.3)

## 2013-11-03 LAB — URINE MICROSCOPIC-ADD ON

## 2013-11-03 LAB — TROPONIN I: Troponin I: <0.3 ng/mL (ref <0.30)

## 2013-11-03 MED ORDER — BRIMONIDINE TARTRATE 0.2 % OP SOLN
1.0000 [drp] | Freq: Three times a day (TID) | OPHTHALMIC | Status: DC
  Administered 2013-11-04 – 2013-11-07 (×9): 1 [drp] via OPHTHALMIC
  Filled 2013-11-03 (×2): qty 5

## 2013-11-03 MED ORDER — ENOXAPARIN SODIUM 40 MG/0.4ML ~~LOC~~ SOLN
40.0000 mg | SUBCUTANEOUS | Status: DC
  Administered 2013-11-03 – 2013-11-06 (×4): 40 mg via SUBCUTANEOUS
  Filled 2013-11-03 (×5): qty 0.4

## 2013-11-03 MED ORDER — SODIUM CHLORIDE 0.9 % IV SOLN
INTRAVENOUS | Status: DC
  Administered 2013-11-03 – 2013-11-05 (×4): via INTRAVENOUS

## 2013-11-03 MED ORDER — LORAZEPAM 2 MG/ML IJ SOLN
1.0000 mg | Freq: Once | INTRAMUSCULAR | Status: AC
  Administered 2013-11-03: 1 mg via INTRAVENOUS
  Filled 2013-11-03: qty 1

## 2013-11-03 MED ORDER — NITROGLYCERIN 0.4 MG SL SUBL: 0.4000 mg | SUBLINGUAL_TABLET | SUBLINGUAL | Status: DC | PRN

## 2013-11-03 MED ORDER — INSULIN ASPART 100 UNIT/ML ~~LOC~~ SOLN: 0.0000 [IU] | SUBCUTANEOUS | Status: DC

## 2013-11-03 MED ORDER — ALBUTEROL SULFATE HFA 108 (90 BASE) MCG/ACT IN AERS
2.0000 | INHALATION_SPRAY | Freq: Four times a day (QID) | RESPIRATORY_TRACT | Status: DC | PRN
  Filled 2013-11-03: qty 6.7

## 2013-11-03 MED ORDER — BRIMONIDINE TARTRATE 0.15 % OP SOLN
1.0000 [drp] | Freq: Three times a day (TID) | OPHTHALMIC | Status: DC
  Filled 2013-11-03: qty 5

## 2013-11-03 MED ORDER — ASPIRIN 325 MG PO TABS: 325.0000 mg | ORAL_TABLET | Freq: Every day | ORAL | Status: DC

## 2013-11-03 MED ORDER — SIMVASTATIN 40 MG PO TABS
40.0000 mg | ORAL_TABLET | Freq: Every day | ORAL | Status: DC
  Administered 2013-11-04: 40 mg via ORAL
  Filled 2013-11-03 (×2): qty 1

## 2013-11-03 MED ORDER — BIOTENE DRY MOUTH MT LIQD
15.0000 mL | Freq: Two times a day (BID) | OROMUCOSAL | Status: DC
  Administered 2013-11-04 – 2013-11-07 (×6): 15 mL via OROMUCOSAL

## 2013-11-03 MED ORDER — METOPROLOL SUCCINATE ER 25 MG PO TB24
25.0000 mg | ORAL_TABLET | Freq: Every day | ORAL | Status: DC
  Administered 2013-11-04 – 2013-11-07 (×4): 25 mg via ORAL
  Filled 2013-11-03 (×4): qty 1

## 2013-11-03 MED ORDER — SENNOSIDES-DOCUSATE SODIUM 8.6-50 MG PO TABS: 1.0000 | ORAL_TABLET | Freq: Every evening | ORAL | Status: DC | PRN

## 2013-11-03 MED ORDER — ASPIRIN 300 MG RE SUPP
300.0000 mg | Freq: Every day | RECTAL | Status: DC
  Administered 2013-11-03: 300 mg via RECTAL
  Filled 2013-11-03 (×2): qty 1

## 2013-11-03 NOTE — Consult Note (Signed)
Referring Physician: Rubin Payor    Chief Complaint: Expressive aphasia  HPI:                                                                                                                                         [name] is an 77 y.o. female who was last spoken to on Saturday night.  Patients family members, as well as friend, attempted to get into with her on early Sunday  But were unable to.  After her family members arrived from being out of town on Sunday evening they noted she was unable to follow commands and having significant problems getting her words out and she was off balance.  Currently patient is in the ER showing difficulty follow commands verbally and visually, able to get about 2 words out fluently then gets frustrated due to inability to get her thoughts out or having word substitution.    Date last known well: Date: 11/01/2013 Time last known well: Unable to determine tPA Given: No: out of the window  Past Medical History  Diagnosis Date  . Complication of anesthesia     OCC HALLUCINATIONS  . S/P angioplasty     10/12  . Angina     INFREG  . Asthma     NO PROBLEMS RECENTLY  . Shortness of breath   . Sleep apnea     06  . GI bleed     12   4 UNITS OF BLOOD  . Blood transfusion     LATE 12 4 UNITS FOR GI BLEED  . Chronic kidney disease     STONES  . Hypertension   . Arthritis   . Diabetes mellitus     OFF JANUVIA AND HUMALOG SINCE NOV  . Anemia   . 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 Kidney stone   . CAD (coronary artery disease)   . Memory loss     Past Surgical History  Procedure Laterality Date  . Coronary angioplasty    . Abdominal hysterectomy    . Hip arthroplasty      BIL  . Knee arthroplasty      LFT  . Eye surgery      CAT IOL LFT    Family History  Problem Relation Age of Onset  . Heart disease Mother   . Heart disease Father   . Diabetes      father  . Other      atherosclerosis mother's side   Social  History:  reports that she has never smoked. She does not have any smokeless tobacco history on file. She reports that she does not drink alcohol or use illicit drugs.  Allergies:  Allergies  Allergen Reactions  . Codeine Nausea Only and Other (See Comments)    REACTION: GI upset    Medications:  No current facility-administered medications for this encounter.   Current Outpatient Prescriptions  Medication Sig Dispense Refill  . albuterol (PROVENTIL HFA;VENTOLIN HFA) 108 (90 BASE) MCG/ACT inhaler Inhale 2 puffs into the lungs every 6 (six) hours as needed. Shortness of breath       . aspirin EC 81 MG tablet Take 81 mg by mouth daily.      . bimatoprost (LUMIGAN) 0.03 % ophthalmic solution Place 1 drop into both eyes at bedtime.        . brimonidine (ALPHAGAN) 0.15 % ophthalmic solution Place 1 drop into both eyes 3 (three) times daily.       . fentaNYL (DURAGESIC - DOSED MCG/HR) 25 MCG/HR patch Place 25 mcg onto the skin every 3 (three) days.      . metFORMIN (GLUCOPHAGE) 500 MG tablet Take 500 mg by mouth 2 (two) times daily with a meal.      . methotrexate 25 MG/ML SOLN Inject 25 mg as directed every 7 (seven) days. Takes on Sunday      . metoprolol succinate (TOPROL-XL) 25 MG 24 hr tablet Take 25 mg by mouth daily.      . simvastatin (ZOCOR) 40 MG tablet Take 40 mg by mouth at bedtime.        . sitaGLIPtin (JANUVIA) 100 MG tablet Take 100 mg by mouth daily.      Marland Kitchen telmisartan (MICARDIS) 80 MG tablet Take 80 mg by mouth daily.        Marland Kitchen telmisartan-hydrochlorothiazide (MICARDIS HCT) 80-12.5 MG per tablet Take 1 tablet by mouth daily.      . nitroGLYCERIN (NITROSTAT) 0.4 MG SL tablet Place 0.4 mg under the tongue every 5 (five) minutes as needed for chest pain. x3 doses as needed for chest pain         ROS:                                                                                                                                        History obtained from unobtainable from patient due to expressive difficulty  Neurologic Examination:                                                                                                      Blood pressure 148/75, pulse 81, temperature 99.6 F (37.6 C), temperature source Rectal, resp. rate 20, weight 67.161 kg (148 lb 1 oz), SpO2 97.00%. Mental Status: Alert, able to try and express herself but having significant word  finding difficulty. She is unable to name objects, repeat, express or follow commands.  Cranial Nerves: II: Discs flat bilaterally; Visual fields grossly normal, pupils equal, round, reactive to light and accommodation III,IV, VI: ptosis not present, extra-ocular motions intact bilaterally V,VII: smile symmetric, facial light touch sensation normal bilaterally VIII: hearing normal bilaterally IX,X: gag reflex present XI: bilateral shoulder shrug XII: midline tongue extension without atrophy or fasciculations  Motor: Right : Upper extremity   5/5    Left:     Upper extremity   5/5 (4/5 tricep and shoulder)  Lower extremity   5/5     Lower extremity   5/5 Tone and bulk:normal tone throughout; no atrophy noted Sensory: Pinprick and light touch intact throughout, bilaterally Deep Tendon Reflexes:  Right: Upper Extremity   Left: Upper extremity   biceps (C-5 to C-6) 2/4   biceps (C-5 to C-6) 2/4 tricep (C7) 2/4    triceps (C7) 2/4 Brachioradialis (C6) 2/4  Brachioradialis (C6) 2/4  Lower Extremity Lower Extremity  quadriceps (L-2 to L-4) 2/4   quadriceps (L-2 to L-4) 2/4 Achilles (S1) 2/4   Achilles (S1) 2/4  Plantars: Right: downgoing   Left: downgoing Cerebellar: No ataxia noted when reaching out for my finger.  Gait: not tested CV: pulses palpable throughout    Results for orders placed during the hospital encounter of 11/03/13 (from the past 48 hour(s))  GLUCOSE, CAPILLARY      Status: Abnormal   Collection Time    11/03/13  1:08 PM      Result Value Range   Glucose-Capillary 130 (*) 70 - 99 mg/dL   Comment 1 Notify RN    CBC     Status: Abnormal   Collection Time    11/03/13  1:10 PM      Result Value Range   WBC 8.5  4.0 - 10.5 K/uL   RBC 4.53  3.87 - 5.11 MIL/uL   Hemoglobin 13.3  12.0 - 15.0 g/dL   HCT 69.6  29.5 - 28.4 %   MCV 83.7  78.0 - 100.0 fL   MCH 29.4  26.0 - 34.0 pg   MCHC 35.1  30.0 - 36.0 g/dL   RDW 13.2 (*) 44.0 - 10.2 %   Platelets 237  150 - 400 K/uL  COMPREHENSIVE METABOLIC PANEL     Status: Abnormal   Collection Time    11/03/13  1:10 PM      Result Value Range   Sodium 139  135 - 145 mEq/L   Potassium 3.7  3.5 - 5.1 mEq/L   Chloride 100  96 - 112 mEq/L   CO2 25  19 - 32 mEq/L   Glucose, Bld 133 (*) 70 - 99 mg/dL   BUN 14  6 - 23 mg/dL   Creatinine, Ser 7.25 (*) 0.50 - 1.10 mg/dL   Calcium 9.8  8.4 - 36.6 mg/dL   Total Protein 8.1  6.0 - 8.3 g/dL   Albumin 4.2  3.5 - 5.2 g/dL   AST 20  0 - 37 U/L   ALT 13  0 - 35 U/L   Alkaline Phosphatase 118 (*) 39 - 117 U/L   Total Bilirubin 1.0  0.3 - 1.2 mg/dL   GFR calc non Af Amer 38 (*) >90 mL/min   GFR calc Af Amer 44 (*) >90 mL/min   Comment: (NOTE)     The eGFR has been calculated using the CKD EPI equation.     This calculation has not  been validated in all clinical situations.     eGFR's persistently <90 mL/min signify possible Chronic Kidney     Disease.  URINALYSIS, ROUTINE W REFLEX MICROSCOPIC     Status: Abnormal   Collection Time    11/03/13  1:51 PM      Result Value Range   Color, Urine YELLOW  YELLOW   APPearance CLEAR  CLEAR   Specific Gravity, Urine 1.013  1.005 - 1.030   pH 6.0  5.0 - 8.0   Glucose, UA NEGATIVE  NEGATIVE mg/dL   Hgb urine dipstick TRACE (*) NEGATIVE   Bilirubin Urine NEGATIVE  NEGATIVE   Ketones, ur NEGATIVE  NEGATIVE mg/dL   Protein, ur NEGATIVE  NEGATIVE mg/dL   Urobilinogen, UA 1.0  0.0 - 1.0 mg/dL   Nitrite NEGATIVE  NEGATIVE    Leukocytes, UA TRACE (*) NEGATIVE  URINE MICROSCOPIC-ADD ON     Status: Abnormal   Collection Time    11/03/13  1:51 PM      Result Value Range   Squamous Epithelial / LPF RARE  RARE   WBC, UA 0-2  <3 WBC/hpf   RBC / HPF 0-2  <3 RBC/hpf   Bacteria, UA RARE  RARE   Casts HYALINE CASTS (*) NEGATIVE   Comment: GRANULAR CAST  TROPONIN I     Status: None   Collection Time    11/03/13  3:40 PM      Result Value Range   Troponin I <0.30  <0.30 ng/mL   Comment:            Due to the release kinetics of cTnI,     a negative result within the first hours     of the onset of symptoms does not rule out     myocardial infarction with certainty.     If myocardial infarction is still suspected,     repeat the test at appropriate intervals.  CG4 I-STAT (LACTIC ACID)     Status: None   Collection Time    11/03/13  4:02 PM      Result Value Range   Lactic Acid, Venous 1.72  0.5 - 2.2 mmol/L   Ct Head (brain) Wo Contrast  11/03/2013   CLINICAL DATA:  Confusion. Possible stroke.  EXAM: CT HEAD WITHOUT CONTRAST  TECHNIQUE: Contiguous axial images were obtained from the base of the skull through the vertex without intravenous contrast.  COMPARISON:  No comparison head CT. Prior sinus CT 10/12/2008.  FINDINGS: Mild motion degradation.  No intracranial hemorrhage.  Small vessel disease type changes without CT evidence of large acute infarct.  Small acute infarct of the left thalamus not excluded.  Mild atrophy without hydrocephalus.  No intracranial mass lesion noted on this unenhanced exam.  Hyperostosis frontalis interna.  Mild mucosal thickening medial aspect left maxillary sinus.  IMPRESSION: Mild motion degradation.  No intracranial hemorrhage.  Small vessel disease type changes without CT evidence of large acute infarct.  Small acute infarct of the left thalamus not excluded.  Mild atrophy without hydrocephalus.   Electronically Signed   By: Bridgett Larsson M.D.   On: 11/03/2013 15:35    Assessment and  plan discussed with with attending physician and they are in agreement.    Felicie Morn PA-C [facility] [phone]  11/03/2013, 4:53 PM   Assessment: 77 y.o. female with onset of aphasia some time during Saturday night. Patient is on ASA daily but it is unclear if she has been taking this on a  daily basis. Currently she shows expressive and receptive aphasia, left proximal strength weakness.  CT head shows possible left thalamic infarct. With above symptoms and possible imbalance must consider embolic CVA involving anterior and posterior circulation.  Stroke Risk Factors -age, diabetes mellitus and CAD    Plan: 1. HgbA1c, fasting lipid panel 2. MRI, MRA  of the brain without contrast 3. PT consult, OT consult, Speech consult 4. Echocardiogram 5. Carotid dopplers 6. Prophylactic therapy-Antiplatelet med: Aspirin - dose 325 PO/PR 7. Risk  Factor modification  Further recommendations after MRI brain.

## 2013-11-03 NOTE — ED Provider Notes (Signed)
CSN: [id number]     Arrival date & time 11/03/13  1300 History   First MD Initiated Contact with Patient 11/03/13 1513     Chief Complaint  Patient presents with  . Altered Mental Status   HPI  Ms [name] is a 77 yo female w/ DM, CAD, HTN who presents with AMS and is here with son and daughter-in law who give most of the hx. THey last spoke with the patient on Saturday evening and she seemed at her baseline normal. Sunday morning, patient reportedly called a friend to say she was not feeling well and missing church. Son was unable to reach patient all day Sunday. When he saw her Sunday evening at 8 pm, patient was confused and lethargic. She has moments of clarity when she is able to state that she is feeling unwell and tired per son. Son has not noticed any weakness on one side, difficulty with walking, problems with urination or bowel movements, fever, chills, or cold symptoms. Patient has been very confused since son has been with the patient. Patient has difficulty getting words out and following commands. She denies any pain  Past Medical History  Diagnosis Date  . Complication of anesthesia     OCC HALLUCINATIONS  . S/P angioplasty     10/12  . Angina     INFREG  . Asthma     NO PROBLEMS RECENTLY  . Shortness of breath   . Sleep apnea     06  . GI bleed     12   4 UNITS OF BLOOD  . Blood transfusion     LATE 12 4 UNITS FOR GI BLEED  . Chronic kidney disease     STONES  . Hypertension   . Arthritis   . Diabetes mellitus     OFF JANUVIA AND HUMALOG SINCE NOV  . Anemia   . 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 Kidney stone   . CAD (coronary artery disease)   . Memory loss    Past Surgical History  Procedure Laterality Date  . Coronary angioplasty    . Abdominal hysterectomy    . Hip arthroplasty      BIL  . Knee arthroplasty      LFT  . Eye surgery      CAT IOL LFT   Family History  Problem Relation Age of Onset  . Heart disease Mother   . Heart  disease Father   . Diabetes      father  . Other      atherosclerosis mother's side   History  Substance Use Topics  . Smoking status: Never Smoker   . Smokeless tobacco: Not on file  . Alcohol Use: No   OB History   Grav Para Term Preterm Abortions TAB SAB Ect Mult Living                 Review of Systems All other systems negative except as documented in the HPI. All pertinent positives and negatives as reviewed in the HPI. Allergies  Codeine  Home Medications   Current Outpatient Rx  Name  Route  Sig  Dispense  Refill  . albuterol (PROVENTIL HFA;VENTOLIN HFA) 108 (90 BASE) MCG/ACT inhaler   Inhalation   Inhale 2 puffs into the lungs every 6 (six) hours as needed. Shortness of breath          . aspirin EC 81 MG tablet   Oral  Take 81 mg by mouth daily.         . bimatoprost (LUMIGAN) 0.03 % ophthalmic solution   Both Eyes   Place 1 drop into both eyes at bedtime.           . brimonidine (ALPHAGAN) 0.15 % ophthalmic solution   Both Eyes   Place 1 drop into both eyes 3 (three) times daily.          . fentaNYL (DURAGESIC - DOSED MCG/HR) 25 MCG/HR patch   Transdermal   Place 25 mcg onto the skin every 3 (three) days.         . metFORMIN (GLUCOPHAGE) 500 MG tablet   Oral   Take 500 mg by mouth 2 (two) times daily with a meal.         . methotrexate 25 MG/ML SOLN   Injection   Inject 25 mg as directed every 7 (seven) days. Takes on Sunday         . metoprolol succinate (TOPROL-XL) 25 MG 24 hr tablet   Oral   Take 25 mg by mouth daily.         . simvastatin (ZOCOR) 40 MG tablet   Oral   Take 40 mg by mouth at bedtime.           . sitaGLIPtin (JANUVIA) 100 MG tablet   Oral   Take 100 mg by mouth daily.         Marland Kitchen telmisartan (MICARDIS) 80 MG tablet   Oral   Take 80 mg by mouth daily.           Marland Kitchen telmisartan-hydrochlorothiazide (MICARDIS HCT) 80-12.5 MG per tablet   Oral   Take 1 tablet by mouth daily.         . nitroGLYCERIN  (NITROSTAT) 0.4 MG SL tablet   Sublingual   Place 0.4 mg under the tongue every 5 (five) minutes as needed for chest pain. x3 doses as needed for chest pain          BP 148/75  Pulse 81  Temp(Src) 99.6 F (37.6 C) (Rectal)  Resp 20  Wt 148 lb 1 oz (67.161 kg)  SpO2 97% Physical Exam  Nursing note and vitals reviewed. Constitutional: She appears well-developed and well-nourished.  HENT:  Head: Normocephalic and atraumatic.  Mouth/Throat: Oropharynx is clear and moist.  Cardiovascular: Normal rate, regular rhythm and normal heart sounds.   Pulmonary/Chest: Effort normal and breath sounds normal.  Neurological: She is alert. She has normal strength. She exhibits normal muscle tone.  Patient can follow commands but very difficult to follow.     ED Course  Procedures (including critical care time) Labs Review Labs Reviewed  CBC - Abnormal; Notable for the following:    RDW 15.8 (*)    All other components within normal limits  COMPREHENSIVE METABOLIC PANEL - Abnormal; Notable for the following:    Glucose, Bld 133 (*)    Creatinine, Ser 1.34 (*)    Alkaline Phosphatase 118 (*)    GFR calc non Af Amer 38 (*)    GFR calc Af Amer 44 (*)    All other components within normal limits  URINALYSIS, ROUTINE W REFLEX MICROSCOPIC - Abnormal; Notable for the following:    Hgb urine dipstick TRACE (*)    Leukocytes, UA TRACE (*)    All other components within normal limits  GLUCOSE, CAPILLARY - Abnormal; Notable for the following:    Glucose-Capillary 130 (*)    All other  components within normal limits  URINE MICROSCOPIC-ADD ON - Abnormal; Notable for the following:    Casts HYALINE CASTS (*)    All other components within normal limits  TROPONIN I  CG4 I-STAT (LACTIC ACID)   Imaging Review Ct Head (brain) Wo Contrast  11/03/2013   CLINICAL DATA:  Confusion. Possible stroke.  EXAM: CT HEAD WITHOUT CONTRAST  TECHNIQUE: Contiguous axial images were obtained from the base of the  skull through the vertex without intravenous contrast.  COMPARISON:  No comparison head CT. Prior sinus CT 10/12/2008.  FINDINGS: Mild motion degradation.  No intracranial hemorrhage.  Small vessel disease type changes without CT evidence of large acute infarct.  Small acute infarct of the left thalamus not excluded.  Mild atrophy without hydrocephalus.  No intracranial mass lesion noted on this unenhanced exam.  Hyperostosis frontalis interna.  Mild mucosal thickening medial aspect left maxillary sinus.  IMPRESSION: Mild motion degradation.  No intracranial hemorrhage.  Small vessel disease type changes without CT evidence of large acute infarct.  Small acute infarct of the left thalamus not excluded.  Mild atrophy without hydrocephalus.   Electronically Signed   By: Bridgett Larsson M.D.   On: 11/03/2013 15:35    EKG Interpretation   None       MDM  4:28 PM spoke with the neurologist, who is going to come and evaluate the patient for stroke symptoms.  I have ordered an MRI and a CT scan did not show any definite stroke.  Patient does have a stroke based on her exam.  Patient is stable at this time  Carlyle Dolly, PA-C 11/04/13 0134

## 2013-11-03 NOTE — ED Notes (Signed)
Patient transported to MRI 

## 2013-11-03 NOTE — ED Notes (Signed)
Pt family states that they were trying to call her since evening time.  A friend told family that patient told them that she was not feeling well yesterday morning and was not going to church.  Family checked on patient last nite and was confused.  Pt cannot follow commands and denies pain.

## 2013-11-03 NOTE — ED Notes (Signed)
Pt last heard from normal over phone approximately 8:49 pm on Saturday evening; Pt family members, as well as friend, attempted to get into with her on early Sunday to no avail., pt family members arrived from being out of town on Sunday evening to find their mother in bed, without energy, listless and barely able to communicate. She did communicate that everything hit her all of a sudden on Sunday.  Pt is not oriented but realizes that something is wrong with her.

## 2013-11-03 NOTE — Progress Notes (Signed)
Unit CM UR Completed by [facility] ED CM  W. Roselyn Doby RN  

## 2013-11-03 NOTE — H&P (Signed)
[facility] History and Physical  [name]:[id number] DOB: December 21, 1936 DOA: 11/03/2013  Referring physician:  PCP: August Saucer ERIC, MD  Specialists:   Chief Complaint: aphasia, change in mental status   HPI: [name] is a 77 y.o. female with PMH of HTN, HPL, DM, CAD, presented with aphasia, and change in mental status; patient's family was ot of town, arrived on "Sunday and  Found her unable to follow commands, aphasiac, unstable gait; no chest pain, no SOB, no fever, chills, no nausea, vomiting or diarrhea'   Review of Systems: The patient denies anorexia, fever, weight loss,, vision loss, decreased hearing, hoarseness, chest pain, syncope, dyspnea on exertion, peripheral edema, balance deficits, hemoptysis, abdominal pain, melena, hematochezia, severe indigestion/heartburn, hematuria, incontinence, genital sores, muscle weakness, suspicious skin lesions, transient blindness, difficulty walking, depression, unusual weight change, abnormal bleeding, enlarged lymph nodes, angioedema, and breast masses.    Past Medical History  Diagnosis Date  . Complication of anesthesia     OCC HALLUCINATIONS  . S/P angioplasty     10/12  . Angina     INFREG  . Asthma     NO PROBLEMS RECENTLY  . Shortness of breath   . Sleep apnea     06  . GI bleed     12"   4 UNITS OF BLOOD  . Blood transfusion     LATE 12 4 UNITS FOR GI BLEED  . Chronic kidney disease     STONES  . Hypertension   . Arthritis   . Diabetes mellitus     OFF JANUVIA AND HUMALOG SINCE NOV  . Anemia   . 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 Kidney stone   . CAD (coronary artery disease)   . Memory loss    Past Surgical History  Procedure Laterality Date  . Coronary angioplasty    . Abdominal hysterectomy    . Hip arthroplasty      BIL  . Knee arthroplasty      LFT  . Eye surgery      CAT IOL LFT   Social History:  reports that she has never smoked. She does not have any smokeless tobacco  history on file. She reports that she does not drink alcohol or use illicit drugs. Home:  where does patient live--home, ALF, SNF? and with whom if at home? Yes:  Can patient participate in ADLs?  Allergies  Allergen Reactions  . Codeine Nausea Only and Other (See Comments)    REACTION: GI upset    Family History  Problem Relation Age of Onset  . Heart disease Mother   . Heart disease Father   . Diabetes      father  . Other      atherosclerosis mother's side    (be sure to complete)  Prior to Admission medications   Medication Sig Start Date End Date Taking? Authorizing Provider  albuterol (PROVENTIL HFA;VENTOLIN HFA) 108 (90 BASE) MCG/ACT inhaler Inhale 2 puffs into the lungs every 6 (six) hours as needed. Shortness of breath    Yes Historical Provider, MD  aspirin EC 81 MG tablet Take 81 mg by mouth daily.   Yes Historical Provider, MD  bimatoprost (LUMIGAN) 0.03 % ophthalmic solution Place 1 drop into both eyes at bedtime.     Yes Historical Provider, MD  brimonidine (ALPHAGAN) 0.15 % ophthalmic solution Place 1 drop into both eyes 3 (three) times daily.    Yes Historical Provider, MD  fentaNYL (DURAGESIC - DOSED MCG/HR) 25 MCG/HR patch Place 25 mcg onto the skin every 3 (three) days.   Yes Historical Provider, MD  metFORMIN (GLUCOPHAGE) 500 MG tablet Take 500 mg by mouth 2 (two) times daily with a meal.   Yes Historical Provider, MD  methotrexate 25 MG/ML SOLN Inject 25 mg as directed every 7 (seven) days. Takes on Sunday   Yes Historical Provider, MD  metoprolol succinate (TOPROL-XL) 25 MG 24 hr tablet Take 25 mg by mouth daily.   Yes Historical Provider, MD  simvastatin (ZOCOR) 40 MG tablet Take 40 mg by mouth at bedtime.     Yes Historical Provider, MD  sitaGLIPtin (JANUVIA) 100 MG tablet Take 100 mg by mouth daily.   Yes Historical Provider, MD  telmisartan (MICARDIS) 80 MG tablet Take 80 mg by mouth daily.     Yes Historical Provider, MD  telmisartan-hydrochlorothiazide  (MICARDIS HCT) 80-12.5 MG per tablet Take 1 tablet by mouth daily.   Yes Historical Provider, MD  nitroGLYCERIN (NITROSTAT) 0.4 MG SL tablet Place 0.4 mg under the tongue every 5 (five) minutes as needed for chest pain. x3 doses as needed for chest pain    Historical Provider, MD   Physical Exam: Filed Vitals:   11/03/13 1630  BP: 155/74  Pulse: 95  Temp:   Resp: 20     General:  Alert, but aphasic,   Eyes: eom-i, perrla   ENT: no oral ulcers  Neck: supple   Cardiovascular: s1,s2 rrr  Respiratory: CTA bl   Abdomen: soft, nt, nd   Skin: no rash   Musculoskeletal: no LE edema   Psychiatric: confused  Neurologic: able to move all extremities, aphasic, no facial asymmetry   Labs on Admission:  Basic Metabolic Panel:  Recent Labs Lab 11/03/13 1310  NA 139  K 3.7  CL 100  CO2 25  GLUCOSE 133*  BUN 14  CREATININE 1.34*  CALCIUM 9.8   Liver Function Tests:  Recent Labs Lab 11/03/13 1310  AST 20  ALT 13  ALKPHOS 118*  BILITOT 1.0  PROT 8.1  ALBUMIN 4.2   No results found for this basename: LIPASE, AMYLASE,  in the last 168 hours No results found for this basename: AMMONIA,  in the last 168 hours CBC:  Recent Labs Lab 11/03/13 1310  WBC 8.5  HGB 13.3  HCT 37.9  MCV 83.7  PLT 237   Cardiac Enzymes:  Recent Labs Lab 11/03/13 1540  TROPONINI <0.30    BNP (last 3 results) No results found for this basename: PROBNP,  in the last 8760 hours CBG:  Recent Labs Lab 11/03/13 1308  GLUCAP 130*    Radiological Exams on Admission: Ct Head (brain) Wo Contrast  11/03/2013   CLINICAL DATA:  Confusion. Possible stroke.  EXAM: CT HEAD WITHOUT CONTRAST  TECHNIQUE: Contiguous axial images were obtained from the base of the skull through the vertex without intravenous contrast.  COMPARISON:  No comparison head CT. Prior sinus CT 10/12/2008.  FINDINGS: Mild motion degradation.  No intracranial hemorrhage.  Small vessel disease type changes without CT  evidence of large acute infarct.  Small acute infarct of the left thalamus not excluded.  Mild atrophy without hydrocephalus.  No intracranial mass lesion noted on this unenhanced exam.  Hyperostosis frontalis interna.  Mild mucosal thickening medial aspect left maxillary sinus.  IMPRESSION: Mild motion degradation.  No intracranial hemorrhage.  Small vessel disease type changes without CT evidence of large acute infarct.  Small acute infarct  of the left thalamus not excluded.  Mild atrophy without hydrocephalus.   Electronically Signed   By: Bridgett Larsson M.D.   On: 11/03/2013 15:35   Mr Maxine Glenn Head Wo Contrast  11/03/2013   CLINICAL DATA:  Expressive a aphasia. Confusion.  EXAM: MRI HEAD WITHOUT CONTRAST  MRA HEAD WITHOUT CONTRAST  TECHNIQUE: Multiplanar, multiecho pulse sequences of the brain and surrounding structures were obtained without intravenous contrast. Angiographic images of the head were obtained using MRA technique without contrast.  COMPARISON:  Head CT same day  FINDINGS: MRI HEAD FINDINGS  There are extensive chronic small vessel changes throughout the pons. No focal cerebellar insult.  There are multiple foci of acute infarction within the left hemisphere primarily in watershed distribution affecting this surface of the brain in the subcortical white matter from front to back. No large confluent area of infarction. No discernible hemorrhage. No mass lesion, hydrocephalus or extra-axial collection.  MRA HEAD FINDINGS  Both internal carotid arteries are widely patent into the brain. No siphon stenosis. The anterior and middle cerebral vessels are patent bilaterally. 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  Both vertebral arteries are widely patent with the right being dominant. No basilar stenosis. Posterior circulation branch vessels are normal. There is a large posterior communicating artery on the right.  IMPRESSION: Multiple acute infarctions scattered along the surface of  the brain in the left hemisphere from front to back, watershed distribution. No swelling, hemorrhage or mass effect.  MR angiography does not show any major vessel occlusion or correctable proximal stenosis.   Electronically Signed   By: Paulina Fusi M.D.   On: 11/03/2013 18:30   Mr Brain Wo Contrast  11/03/2013   CLINICAL DATA:  Expressive a aphasia. Confusion.  EXAM: MRI HEAD WITHOUT CONTRAST  MRA HEAD WITHOUT CONTRAST  TECHNIQUE: Multiplanar, multiecho pulse sequences of the brain and surrounding structures were obtained without intravenous contrast. Angiographic images of the head were obtained using MRA technique without contrast.  COMPARISON:  Head CT same day  FINDINGS: MRI HEAD FINDINGS  There are extensive chronic small vessel changes throughout the pons. No focal cerebellar insult.  There are multiple foci of acute infarction within the left hemisphere primarily in watershed distribution affecting this surface of the brain in the subcortical white matter from front to back. No large confluent area of infarction. No discernible hemorrhage. No mass lesion, hydrocephalus or extra-axial collection.  MRA HEAD FINDINGS  Both internal carotid arteries are widely patent into the brain. No siphon stenosis. The anterior and middle cerebral vessels are patent bilaterally. 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  Both vertebral arteries are widely patent with the right being dominant. No basilar stenosis. Posterior circulation branch vessels are normal. There is a large posterior communicating artery on the right.  IMPRESSION: Multiple acute infarctions scattered along the surface of the brain in the left hemisphere from front to back, watershed distribution. No swelling, hemorrhage or mass effect.  MR angiography does not show any major vessel occlusion or correctable proximal stenosis.   Electronically Signed   By: Paulina Fusi M.D.   On: 11/03/2013 18:30   Dg Chest Portable 1  View  11/03/2013   CLINICAL DATA:  Altered mental status.  EXAM: PORTABLE CHEST - 1 VIEW  COMPARISON:  04/04/2013  FINDINGS: Linear subsegmental atelectasis in the right lung base. No focal opacity on the left. Heart is upper limits normal in size. No effusions or acute bony abnormality.  IMPRESSION: Right base subsegmental atelectasis.   Electronically Signed   By: Charlett Nose M.D.   On: 11/03/2013 17:26    EKG: Independently reviewed. NSR, RBBB  Assessment/Plan   Acute CVA (cerebrovascular accident)  Active Problems:   DIABETES MELLITUS, TYPE II   HYPERLIPIDEMIA   HYPERTENSION   77 y.o. female with PMH of HTN, HPL, DM, CAD, presented with aphasia, and change in mental status and found to have acute CVA   1. Acute CVA, aphasia, acute encephalopathy; MRI: There are multiple foci of acute infarction within the left hemisphere primarily in watershed distribution affecting this surface of the brain in the subcortical white matter from front to back -admit tele; CVA work up; cont ASA; may need to change to plavix; control risk factors; neurology involved   1. AKI likely prerenal, dehydration; IVF; monitor renal function   3. DM last HA1C-7.0 (03/2013); hold metformin; ISS CBG Q4 while NPO; recheck HA1C  4. CAD; no acute cardiopulmonary symptoms; cont BB, ASA, statin   5. HTN; permissive HTN; hold diuretics/ARB; cont BB;   Neurology:  if consultant consulted, please document name and whether formally or informally consulted  Code Status: full (must indicate code status--if unknown or must be presumed, indicate so) Family Communication: none at the bedside (indicate person spoken with, if applicable, with phone number if by telephone) Disposition Plan: pend PT eval;  (indicate anticipated LOS)  Time spent: >35 minutes   Esperanza Sheets [facility] Pager [phone]  If 7PM-7AM, please contact night-coverage [email or web address] Password [facility] 11/03/2013, 6:35 PM

## 2013-11-04 ENCOUNTER — Encounter (HOSPITAL_COMMUNITY): Payer: Self-pay | Admitting: Physical Medicine and Rehabilitation

## 2013-11-04 DIAGNOSIS — I6330 Cerebral infarction due to thrombosis of unspecified cerebral artery: Secondary | ICD-10-CM

## 2013-11-04 DIAGNOSIS — J45909 Unspecified asthma, uncomplicated: Secondary | ICD-10-CM

## 2013-11-04 DIAGNOSIS — I6350 Cerebral infarction due to unspecified occlusion or stenosis of unspecified cerebral artery: Secondary | ICD-10-CM

## 2013-11-04 DIAGNOSIS — I517 Cardiomegaly: Secondary | ICD-10-CM

## 2013-11-04 LAB — BASIC METABOLIC PANEL
BUN: 19 mg/dL (ref 6–23)
CO2: 23 mEq/L (ref 19–32)
Calcium: 9.2 mg/dL (ref 8.4–10.5)
Chloride: 105 mEq/L (ref 96–112)
Creatinine, Ser: 1.41 mg/dL — ABNORMAL HIGH (ref 0.50–1.10)
Glucose, Bld: 95 mg/dL (ref 70–99)
Sodium: 142 mEq/L (ref 135–145)

## 2013-11-04 LAB — CBC
HCT: 35.5 % — ABNORMAL LOW (ref 36.0–46.0)
MCH: 28.6 pg (ref 26.0–34.0)
MCHC: 33.8 g/dL (ref 30.0–36.0)
MCV: 84.7 fL (ref 78.0–100.0)
RBC: 4.19 MIL/uL (ref 3.87–5.11)
WBC: 7.8 10*3/uL (ref 4.0–10.5)

## 2013-11-04 LAB — GLUCOSE, CAPILLARY
Glucose-Capillary: 98 mg/dL (ref 70–99)
Glucose-Capillary: 120 mg/dL — ABNORMAL HIGH (ref 70–99)

## 2013-11-04 LAB — HEMOGLOBIN A1C
Hgb A1c MFr Bld: 6.5 % — ABNORMAL HIGH (ref <5.7)
Mean Plasma Glucose: 140 mg/dL — ABNORMAL HIGH (ref <117)
Mean Plasma Glucose: 140 mg/dL — ABNORMAL HIGH (ref <117)

## 2013-11-04 LAB — CREATININE, SERUM
Creatinine, Ser: 1.3 mg/dL — ABNORMAL HIGH (ref 0.50–1.10)
GFR calc Af Amer: 45 mL/min — ABNORMAL LOW (ref >90)
GFR calc non Af Amer: 39 mL/min — ABNORMAL LOW (ref >90)

## 2013-11-04 LAB — LIPID PANEL
HDL: 51 mg/dL (ref >39)
LDL Cholesterol: 141 mg/dL — ABNORMAL HIGH (ref 0–99)
Total CHOL/HDL Ratio: 4.2 RATIO
Triglycerides: 101 mg/dL (ref <150)

## 2013-11-04 MED ORDER — INSULIN ASPART 100 UNIT/ML ~~LOC~~ SOLN: 0.0000 [IU] | Freq: Three times a day (TID) | SUBCUTANEOUS | Status: DC

## 2013-11-04 MED ORDER — ASPIRIN EC 325 MG PO TBEC
325.0000 mg | DELAYED_RELEASE_TABLET | Freq: Every day | ORAL | Status: DC
  Administered 2013-11-04 – 2013-11-06 (×3): 325 mg via ORAL
  Filled 2013-11-04 (×3): qty 1

## 2013-11-04 NOTE — Progress Notes (Signed)
Rehab Admissions Coordinator Note:  Patient was screened by [name] for appropriateness for an Inpatient Acute Rehab Consult.  At this time, we are recommending Inpatient Rehab consult.  [name] 11/04/2013, 9:53 AM  I can be reached at [phone].

## 2013-11-04 NOTE — Progress Notes (Signed)
  Echocardiogram 2D Echocardiogram has been performed.  Nicholaus Steinke, [facility] 11/04/2013, 11:49 AM

## 2013-11-04 NOTE — Progress Notes (Deleted)
RN was given in report that pt was unable to complete MRI on 11/03/13 with 1mg  ativan due to anxiety. 2mg  ativan ordered for MRI. RN spoke with radiology. They were going to make MRI aware that pt has increased dose of ativan so they can retry her today.

## 2013-11-04 NOTE — Progress Notes (Signed)
[facility]                                                                                Patient Demographics  [name], is a 77 y.o. female, DOB - 30-Apr-1936, ZOX:[id number]  Admit date - 11/03/2013   Admitting Physician Stacey Sheets, MD  Outpatient Primary MD for the patient is DEAN, ERIC, MD  LOS - 1  Interim history This is a 77 year old female history of hypertension, hyperlipidemia, diabetes, coronary disease that presented with a fascia and mental status changes. It seems the patient did not go to church on Sunday so members went to check on her and found her aphasic with the unsteady gait as well as inability to follow commands. MRI did show acute infarction left hemisphere. Neurology has been consulted patient is currently on aspirin therapy. CIR has been consulted.  Chief Complaint  Patient presents with  . Altered Mental Status        Assessment & Plan    Principal Problem:   Acute CVA (cerebrovascular accident) Active Problems:   DIABETES MELLITUS, TYPE II   HYPERLIPIDEMIA   HYPERTENSION  Acute CVA, with aphasia, acute encephalopathy -MRI: Multiple foci of acute infarction within the left hemisphere primarily in watershed distribution affecting this surface of the brain in the subcortical white matter from front to back  -Neurology consulted -Continue Aspirin therapy, possibly switch to Plavix? -PT and OT consulted and recommended CIR  Acute Kidney Injury - May be secondary to medciations vs dehydration - Will not order urine electrolytes as they will be skewed as patient is on HCTZ - Holding metformin and Micardis HCTZ - Will continue IVF and monitoring   Diabetes Mellitus, type 2 -Hb A1C-7.0 (03/2013) -Will  hold metformin due to Cr Continue ISS and CBG  CAD -Currently chest pain free -Continue metoprolol, ASA, simvastatin  HTN -Will allow for permissive HTN -Will continue metoprolol, however hold micardis  Code Status:  Full  Family Communication:  None at this time  Disposition Plan: Admitted, pending CIR or rehab placement.   Procedures  Echocardiogram Carotid doppler  Consults   Inpatient Rehab Neurology  DVT Prophylaxis  Lovenox  Lab Results  Component Value Date   PLT 207 11/04/2013    Medications  Scheduled Meds: . antiseptic oral rinse  15 mL Mouth Rinse BID  . aspirin EC  325 mg Oral Daily  . brimonidine  1 drop Both Eyes TID  . enoxaparin (LOVENOX) injection  40 mg Subcutaneous Q24H  . insulin aspart  0-9 Units Subcutaneous Q4H  . metoprolol succinate  25 mg Oral Daily  . simvastatin  40 mg Oral QHS   Continuous Infusions: . sodium chloride 100 mL/hr at 11/03/13 2211   PRN Meds:.albuterol, nitroGLYCERIN, senna-docusate  Antibiotics    Anti-infectives   None     Time Spent in minutes   30 minutes   [name] D.O. on 11/04/2013 at 10:19 AM  Between 7am to 7pm - Pager - [phone]  After 7pm go to [email or web address] - password [email or web address]  And look for the night coverage person covering for me after hours  [facility] Office  [phone]    Subjective:  [name] seen and examined today. Patient does not respond to questions.  States "yes" to everything.  She is able to follow some commands.  Objective:   Filed Vitals:   11/03/13 2017 11/04/13 0251 11/04/13 0410 11/04/13 0605  BP:  115/40 131/79 136/85  Pulse:  98 95 94  Temp:  97.6 F (36.4 C) 97.9 F (36.6 C) 98.3 F (36.8 C)  TempSrc:  Axillary Oral Oral  Resp:  18 20 20   Height: 5\' 1"  (1.549 m)     Weight: 67.495 kg (148 lb 12.8 oz)     SpO2:  10% 92% 97%    Wt Readings from Last 3 Encounters:  11/03/13 67.495 kg (148 lb 12.8 oz)  04/08/13 65.726 kg (144 lb 14.4 oz)  12/23/12 69.4 kg (153 lb)    No intake or output data in the 24 hours ending 11/04/13 1019  Exam  General: Well developed, well nourished, NAD, appears stated age  HEENT: NCAT, PERRLA, EOMI, Anicteic  Sclera, mucous membranes moist. No pharyngeal erythema or exudates  Neck: Supple, no JVD, no masses  Cardiovascular: S1 S2 auscultated, no rubs, murmurs or gallops. Regular rate and rhythm.  Respiratory: Clear to auscultation bilaterally with equal chest rise  Abdomen: Soft, nontender, nondistended, + bowel sounds  Extremities: warm dry without cyanosis clubbing or edema  Neuro:Awake and alert, unable to fully assess.  Patient follows some commands  Skin: Without rashes exudates or nodules  Psych: Unable to assess  Data Review   Micro Results No results found for this or any previous visit (from the past 240 hour(s)).  Radiology Reports Ct Head (brain) Wo Contrast  11/03/2013   CLINICAL DATA:  Confusion. Possible stroke.  EXAM: CT HEAD WITHOUT CONTRAST  TECHNIQUE: Contiguous axial images were obtained from the base of the skull through the vertex without intravenous contrast.  COMPARISON:  No comparison head CT. Prior sinus CT 10/12/2008.  FINDINGS: Mild motion degradation.  No intracranial hemorrhage.  Small vessel disease type changes without CT evidence of large acute infarct.  Small acute infarct of the left thalamus not excluded.  Mild atrophy without hydrocephalus.  No intracranial mass lesion noted on this unenhanced exam.  Hyperostosis frontalis interna.  Mild mucosal thickening medial aspect left maxillary sinus.  IMPRESSION: Mild motion degradation.  No intracranial hemorrhage.  Small vessel disease type changes without CT evidence of large acute infarct.  Small acute infarct of the left thalamus not excluded.  Mild atrophy without hydrocephalus.   Electronically Signed   By: [name] M.D.   On: 11/03/2013 15:35   Mr [name] Head Wo Contrast  11/03/2013   CLINICAL DATA:  Expressive a aphasia. Confusion.  EXAM: MRI HEAD WITHOUT CONTRAST  MRA HEAD WITHOUT CONTRAST  TECHNIQUE: Multiplanar, multiecho pulse sequences of the brain and surrounding structures were obtained without  intravenous contrast. Angiographic images of the head were obtained using MRA technique without contrast.  COMPARISON:  Head CT same day  FINDINGS: MRI HEAD FINDINGS  There are extensive chronic small vessel changes throughout the pons. No focal cerebellar insult.  There are multiple foci of acute infarction within the left hemisphere primarily in watershed distribution affecting this surface of the brain in the subcortical white matter from front to back. No large confluent area of infarction. No discernible hemorrhage. No mass lesion, hydrocephalus or extra-axial collection.  MRA HEAD FINDINGS  Both internal carotid arteries are widely patent into the brain. No siphon stenosis. The anterior and middle cerebral vessels are patent  bilaterally. 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  Both vertebral arteries are widely patent with the right being dominant. No basilar stenosis. Posterior circulation branch vessels are normal. There is a large posterior communicating artery on the right.  IMPRESSION: Multiple acute infarctions scattered along the surface of the brain in the left hemisphere from front to back, watershed distribution. No swelling, hemorrhage or mass effect.  MR angiography does not show any major vessel occlusion or correctable proximal stenosis.   Electronically Signed   By: Paulina Fusi M.D.   On: 11/03/2013 18:30   Mr Brain Wo Contrast  11/03/2013   CLINICAL DATA:  Expressive a aphasia. Confusion.  EXAM: MRI HEAD WITHOUT CONTRAST  MRA HEAD WITHOUT CONTRAST  TECHNIQUE: Multiplanar, multiecho pulse sequences of the brain and surrounding structures were obtained without intravenous contrast. Angiographic images of the head were obtained using MRA technique without contrast.  COMPARISON:  Head CT same day  FINDINGS: MRI HEAD FINDINGS  There are extensive chronic small vessel changes throughout the pons. No focal cerebellar insult.  There are multiple foci of acute infarction within  the left hemisphere primarily in watershed distribution affecting this surface of the brain in the subcortical white matter from front to back. No large confluent area of infarction. No discernible hemorrhage. No mass lesion, hydrocephalus or extra-axial collection.  MRA HEAD FINDINGS  Both internal carotid arteries are widely patent into the brain. No siphon stenosis. The anterior and middle cerebral vessels are patent bilaterally. 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  Both vertebral arteries are widely patent with the right being dominant. No basilar stenosis. Posterior circulation branch vessels are normal. There is a large posterior communicating artery on the right.  IMPRESSION: Multiple acute infarctions scattered along the surface of the brain in the left hemisphere from front to back, watershed distribution. No swelling, hemorrhage or mass effect.  MR angiography does not show any major vessel occlusion or correctable proximal stenosis.   Electronically Signed   By: Paulina Fusi M.D.   On: 11/03/2013 18:30   Dg Chest Portable 1 View  11/03/2013   CLINICAL DATA:  Altered mental status.  EXAM: PORTABLE CHEST - 1 VIEW  COMPARISON:  04/04/2013  FINDINGS: Linear subsegmental atelectasis in the right lung base. No focal opacity on the left. Heart is upper limits normal in size. No effusions or acute bony abnormality.  IMPRESSION: Right base subsegmental atelectasis.   Electronically Signed   By: Charlett Nose M.D.   On: 11/03/2013 17:26    CBC  Recent Labs Lab 11/03/13 1310 11/03/13 2320 11/04/13 0545  WBC 8.5 8.6 7.8  HGB 13.3 12.4 12.0  HCT 37.9 36.3 35.5*  PLT 237 202 207  MCV 83.7 84.0 84.7  MCH 29.4 28.7 28.6  MCHC 35.1 34.2 33.8  RDW 15.8* 15.8* 16.0*    Chemistries   Recent Labs Lab 11/03/13 1310 11/03/13 2320 11/04/13 0545  NA 139  --  142  K 3.7  --  3.5  CL 100  --  105  CO2 25  --  23  GLUCOSE 133*  --  95  BUN 14  --  19  CREATININE 1.34* 1.30*  1.41*  CALCIUM 9.8  --  9.2  AST 20  --   --   ALT 13  --   --   ALKPHOS 118*  --   --   BILITOT 1.0  --   --    ------------------------------------------------------------------------------------------------------------------ estimated creatinine  clearance is 30.3 ml/min (by C-G formula based on Cr of 1.41). ------------------------------------------------------------------------------------------------------------------ No results found for this basename: HGBA1C,  in the last 72 hours ------------------------------------------------------------------------------------------------------------------  Recent Labs  11/04/13 0545  CHOL 212*  HDL 51  LDLCALC 141*  TRIG 101  CHOLHDL 4.2   ------------------------------------------------------------------------------------------------------------------ No results found for this basename: TSH, T4TOTAL, FREET3, T3FREE, THYROIDAB,  in the last 72 hours ------------------------------------------------------------------------------------------------------------------ No results found for this basename: VITAMINB12, FOLATE, FERRITIN, TIBC, IRON, RETICCTPCT,  in the last 72 hours  Coagulation profile No results found for this basename: INR, PROTIME,  in the last 168 hours  No results found for this basename: DDIMER,  in the last 72 hours  Cardiac Enzymes  Recent Labs Lab 11/03/13 1540  TROPONINI <0.30   ------------------------------------------------------------------------------------------------------------------ No components found with this basename: POCBNP,

## 2013-11-04 NOTE — Consult Note (Signed)
Physical Medicine and Rehabilitation Consult\  Reason for Consult: Speech difficulties, cognitive deficits, apraxia and right inattention.  Referring Physician: Dr. Catha Gosselin   HPI: [name] is a 77 y.o. female with history of CAD, HTN, memory loss who was found by family on 11/03/13 with difficulty talking, difficulty following commands and balance deficits. Last seen normal two days PTA. MRI/MRA brain done revealing multiple acute infarctions scattered along the surface of the brain in the left hemisphere from front to back, watershed distribution and no major occlusion. Work up initiated and ongoing--ASA recommended by neurology with question of embolic CVA. Therapy evaluations done today and patient noted to have ideation apraxia with lack of insight into deficits, right inattention, severe impairments in expressive language with problems processing as well as right neglect.  PT, OT, ST, MD recommending CIR.    Review of Systems  Unable to perform ROS: language    Past Medical History  Diagnosis Date  . Complication of anesthesia     OCC HALLUCINATIONS  . S/P angioplasty     10/12  . Angina     INFREG  . Asthma     NO PROBLEMS RECENTLY  . Shortness of breath   . Sleep apnea     06  . GI bleed     12  4 UNITS OF BLOOD  . Blood transfusion     LATE 12 4 UNITS FOR GI BLEED  . Chronic kidney disease     STONES  . Hypertension   . Arthritis   . Diabetes mellitus     OFF JANUVIA AND HUMALOG SINCE NOV  . Anemia   . 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 Kidney stone   . CAD (coronary artery disease)   . Memory loss    Past Surgical History  Procedure Laterality Date  . Coronary angioplasty    . Abdominal hysterectomy    . Hip arthroplasty      BIL  . Knee arthroplasty      LFT  . Eye surgery      CAT IOL LFT  . Decompressive lumbar laminectomy level 1  01/06/12   Family History  Problem Relation Age of Onset  . Heart disease Mother   . Heart  disease Father   . Diabetes      father  . Other      atherosclerosis mother's side   Social History:  Lives alone. Retired Runner, broadcasting/film/video. Per reports that she has never smoked. She does not have any smokeless tobacco history on file. She reports that she does not drink alcohol or use illicit drugs.   Allergies  Allergen Reactions  . Codeine Nausea Only and Other (See Comments)    REACTION: GI upset   Medications Prior to Admission  Medication Sig Dispense Refill  . albuterol (PROVENTIL HFA;VENTOLIN HFA) 108 (90 BASE) MCG/ACT inhaler Inhale 2 puffs into the lungs every 6 (six) hours as needed. Shortness of breath       . aspirin EC 81 MG tablet Take 81 mg by mouth daily.      . bimatoprost (LUMIGAN) 0.03 % ophthalmic solution Place 1 drop into both eyes at bedtime.        . brimonidine (ALPHAGAN) 0.15 % ophthalmic solution Place 1 drop into both eyes 3 (three) times daily.       . fentaNYL (DURAGESIC - DOSED MCG/HR) 25 MCG/HR patch Place 25 mcg onto the skin every 3 (three) days.      Marland Kitchen  metFORMIN (GLUCOPHAGE) 500 MG tablet Take 500 mg by mouth 2 (two) times daily with a meal.      . methotrexate 25 MG/ML SOLN Inject 25 mg as directed every 7 (seven) days. Takes on Sunday      . metoprolol succinate (TOPROL-XL) 25 MG 24 hr tablet Take 25 mg by mouth daily.      . simvastatin (ZOCOR) 40 MG tablet Take 40 mg by mouth at bedtime.        . sitaGLIPtin (JANUVIA) 100 MG tablet Take 100 mg by mouth daily.      Marland Kitchen telmisartan (MICARDIS) 80 MG tablet Take 80 mg by mouth daily.        Marland Kitchen telmisartan-hydrochlorothiazide (MICARDIS HCT) 80-12.5 MG per tablet Take 1 tablet by mouth daily.      . nitroGLYCERIN (NITROSTAT) 0.4 MG SL tablet Place 0.4 mg under the tongue every 5 (five) minutes as needed for chest pain. x3 doses as needed for chest pain        Home: Home Living Family/patient expects to be discharged to:: Inpatient rehab Living Arrangements: Alone Available Help at Discharge: Family Type of  Home: House Additional Comments: pt has very supportive family that can provide 24/7 assist/supervision if needed  Functional History:   Functional Status:  Mobility: Bed Mobility Bed Mobility: Supine to Sit;Sitting - Scoot to Edge of Bed Supine to Sit: 4: Min assist;HOB flat Sitting - Scoot to [place] of Bed: 4: Min guard Transfers Transfers: Sit to Stand;Stand to Sit Sit to Stand: 4: Min assist;With upper extremity assist;From bed;From toilet;4: Min guard Stand to Sit: 4: Min assist;With upper extremity assist;To toilet Ambulation/Gait Ambulation/Gait Assistance: 4: Min assist Ambulation Distance (Feet): 120 Feet Assistive device: 1 person hand held assist Ambulation/Gait Assistance Details: pt unsteady requiring pt to hold onto arm of son for stabilty. Pt with staggering, slow gait pattern. Gait Pattern: Step-through pattern;Decreased stride length Gait velocity: guarded/cautious Stairs: No    ADL: ADL Eating/Feeding: Moderate assistance Where Assessed - Eating/Feeding: Edge of bed Grooming: Wash/dry hands;Teeth care;Wash/dry face;Moderate assistance;Maximal assistance Where Assessed - Grooming: Supported standing;Other (comment) (also washed some of face sitting EOB-asked to wash bottom) Upper Body Bathing: Moderate assistance Where Assessed - Upper Body Bathing: Unsupported sitting Lower Body Bathing: Moderate assistance Where Assessed - Lower Body Bathing: Unsupported sit to stand Upper Body Dressing: Moderate assistance Where Assessed - Upper Body Dressing: Unsupported sitting Lower Body Dressing: Moderate assistance Where Assessed - Lower Body Dressing: Unsupported sit to stand Toilet Transfer: Min guard;Minimal assistance Toilet Transfer Method: Sit to stand (stand to sit) Acupuncturist: Comfort height toilet;Grab bars Tub/Shower Transfer Method: Not assessed Equipment Used: Gait belt Transfers/Ambulation Related to ADLs: Min A for ambulation and Min  guard/Min A for transfers. ADL Comments: Pt appears to demonstrate apraxia. Pt requiring several cues throughout session. Pt placing toothbrush up nose and then attempting to brush teeth with finger. Hand over hand to initiate and assist with tasks. OT started donning sock for pt and then pt able to complete with assist for balance.  Cognition: Cognition Overall Cognitive Status: Impaired/Different from baseline Arousal/Alertness: Awake/alert Orientation Level: Oriented to person;Oriented to place;Disoriented to time;Disoriented to situation Attention: Sustained;Selective Sustained Attention: Impaired Sustained Attention Impairment: Verbal complex;Functional complex Selective Attention: Impaired Selective Attention Impairment: Verbal basic;Functional basic Memory:  (UTA due to aphasia) Awareness: Impaired Awareness Impairment: Intellectual impairment;Emergent impairment Problem Solving: Impaired Problem Solving Impairment: Verbal basic;Functional basic Executive Function: Self Monitoring;Self Correcting;Initiating Initiating: Impaired Initiating Impairment: Verbal basic;Functional basic Self  Monitoring: Impaired Self Monitoring Impairment: Verbal basic;Functional basic Self Correcting: Impaired Self Correcting Impairment: Verbal basic;Functional basic Safety/Judgment: Impaired Cognition Arousal/Alertness: Awake/alert Behavior During Therapy: Flat affect;Impulsive Overall Cognitive Status: Impaired/Different from baseline Area of Impairment: Attention;Memory;Following commands;Safety/judgement;Awareness;Problem solving (unable to assess orientation due to word finding difficulty) Current Attention Level: Focused Memory: Decreased short-term memory Following Commands: Follows one step commands inconsistently Safety/Judgement: Decreased awareness of safety;Decreased awareness of deficits Awareness: Intellectual Problem Solving: Slow processing;Decreased initiation;Difficulty  sequencing;Requires verbal cues;Requires tactile cues General Comments: pt with apparent R sided inattention however son reports she has glaucoma in R eye as well however pt unable to find anything on R side ie. wash cloth, toliet paper. Pt with noted apraxia as well. Pt initiated tolieting however did not initiate retreiving toliet paper or hygiene. Pt responded "I already did that" when cued. Pt requires max directional verbal cues to sequence and stay on task  Blood pressure 136/85, pulse 94, temperature 98.3 F (36.8 C), temperature source Oral, resp. rate 20, height 5\' 1"  (1.549 m), weight 67.495 kg (148 lb 12.8 oz), SpO2 97.00%. Physical Exam  Constitutional: She appears well-developed and well-nourished. No distress.  HENT:  Head: Normocephalic and atraumatic.  Right Ear: External ear normal.  Left Ear: External ear normal.  Eyes: Conjunctivae and EOM are normal. Pupils are equal, round, and reactive to light.  Neck: No JVD present. No tracheal deviation present. No thyromegaly present.  Cardiovascular: Normal rate.   Respiratory: Effort normal. No respiratory distress.  GI: She exhibits no distension. There is no tenderness.  Musculoskeletal: Normal range of motion. She exhibits edema and tenderness.  Lymphadenopathy:    She has no cervical adenopathy.  Neurological: She is alert.  Unable to follow but occasional simple commands. Word finding deficits, receptive language deficits, and processing delays. Easily becomes perseverative with a given task. Moves all 4, left side more preferentially than left. I asked her to ID my watch and she could not, but she said "oh yeah!" as if she recognized.   Psychiatric: She has a normal mood and affect.    Results for orders placed during the hospital encounter of 11/03/13 (from the past 24 hour(s))  GLUCOSE, CAPILLARY     Status: Abnormal   Collection Time    11/03/13  1:08 PM      Result Value Range   Glucose-Capillary 130 (*) 70 - 99  mg/dL   Comment 1 Notify RN    CBC     Status: Abnormal   Collection Time    11/03/13  1:10 PM      Result Value Range   WBC 8.5  4.0 - 10.5 K/uL   RBC 4.53  3.87 - 5.11 MIL/uL   Hemoglobin 13.3  12.0 - 15.0 g/dL   HCT 19.1  47.8 - 29.5 %   MCV 83.7  78.0 - 100.0 fL   MCH 29.4  26.0 - 34.0 pg   MCHC 35.1  30.0 - 36.0 g/dL   RDW 62.1 (*) 30.8 - 65.7 %   Platelets 237  150 - 400 K/uL  COMPREHENSIVE METABOLIC PANEL     Status: Abnormal   Collection Time    11/03/13  1:10 PM      Result Value Range   Sodium 139  135 - 145 mEq/L   Potassium 3.7  3.5 - 5.1 mEq/L   Chloride 100  96 - 112 mEq/L   CO2 25  19 - 32 mEq/L   Glucose, Bld 133 (*) 70 - 99  mg/dL   BUN 14  6 - 23 mg/dL   Creatinine, Ser 1.61 (*) 0.50 - 1.10 mg/dL   Calcium 9.8  8.4 - 09.6 mg/dL   Total Protein 8.1  6.0 - 8.3 g/dL   Albumin 4.2  3.5 - 5.2 g/dL   AST 20  0 - 37 U/L   ALT 13  0 - 35 U/L   Alkaline Phosphatase 118 (*) 39 - 117 U/L   Total Bilirubin 1.0  0.3 - 1.2 mg/dL   GFR calc non Af Amer 38 (*) >90 mL/min   GFR calc Af Amer 44 (*) >90 mL/min  URINALYSIS, ROUTINE W REFLEX MICROSCOPIC     Status: Abnormal   Collection Time    11/03/13  1:51 PM      Result Value Range   Color, Urine YELLOW  YELLOW   APPearance CLEAR  CLEAR   Specific Gravity, Urine 1.013  1.005 - 1.030   pH 6.0  5.0 - 8.0   Glucose, UA NEGATIVE  NEGATIVE mg/dL   Hgb urine dipstick TRACE (*) NEGATIVE   Bilirubin Urine NEGATIVE  NEGATIVE   Ketones, ur NEGATIVE  NEGATIVE mg/dL   Protein, ur NEGATIVE  NEGATIVE mg/dL   Urobilinogen, UA 1.0  0.0 - 1.0 mg/dL   Nitrite NEGATIVE  NEGATIVE   Leukocytes, UA TRACE (*) NEGATIVE  URINE MICROSCOPIC-ADD ON     Status: Abnormal   Collection Time    11/03/13  1:51 PM      Result Value Range   Squamous Epithelial / LPF RARE  RARE   WBC, UA 0-2  <3 WBC/hpf   RBC / HPF 0-2  <3 RBC/hpf   Bacteria, UA RARE  RARE   Casts HYALINE CASTS (*) NEGATIVE  TROPONIN I     Status: None   Collection Time     11/03/13  3:40 PM      Result Value Range   Troponin I <0.30  <0.30 ng/mL  CG4 I-STAT (LACTIC ACID)     Status: None   Collection Time    11/03/13  4:02 PM      Result Value Range   Lactic Acid, Venous 1.72  0.5 - 2.2 mmol/L  GLUCOSE, CAPILLARY     Status: Abnormal   Collection Time    11/03/13  8:52 PM      Result Value Range   Glucose-Capillary 134 (*) 70 - 99 mg/dL  CBC     Status: Abnormal   Collection Time    11/03/13 11:20 PM      Result Value Range   WBC 8.6  4.0 - 10.5 K/uL   RBC 4.32  3.87 - 5.11 MIL/uL   Hemoglobin 12.4  12.0 - 15.0 g/dL   HCT 04.5  40.9 - 81.1 %   MCV 84.0  78.0 - 100.0 fL   MCH 28.7  26.0 - 34.0 pg   MCHC 34.2  30.0 - 36.0 g/dL   RDW 91.4 (*) 78.2 - 95.6 %   Platelets 202  150 - 400 K/uL  CREATININE, SERUM     Status: Abnormal   Collection Time    11/03/13 11:20 PM      Result Value Range   Creatinine, Ser 1.30 (*) 0.50 - 1.10 mg/dL   GFR calc non Af Amer 39 (*) >90 mL/min   GFR calc Af Amer 45 (*) >90 mL/min  GLUCOSE, CAPILLARY     Status: Abnormal   Collection Time    11/04/13 12:18 AM  Result Value Range   Glucose-Capillary 121 (*) 70 - 99 mg/dL  GLUCOSE, CAPILLARY     Status: Abnormal   Collection Time    11/04/13  4:00 AM      Result Value Range   Glucose-Capillary 113 (*) 70 - 99 mg/dL  LIPID PANEL     Status: Abnormal   Collection Time    11/04/13  5:45 AM      Result Value Range   Cholesterol 212 (*) 0 - 200 mg/dL   Triglycerides 161  <096 mg/dL   HDL 51  >04 mg/dL   Total CHOL/HDL Ratio 4.2     VLDL 20  0 - 40 mg/dL   LDL Cholesterol 540 (*) 0 - 99 mg/dL  CBC     Status: Abnormal   Collection Time    11/04/13  5:45 AM      Result Value Range   WBC 7.8  4.0 - 10.5 K/uL   RBC 4.19  3.87 - 5.11 MIL/uL   Hemoglobin 12.0  12.0 - 15.0 g/dL   HCT 98.1 (*) 19.1 - 47.8 %   MCV 84.7  78.0 - 100.0 fL   MCH 28.6  26.0 - 34.0 pg   MCHC 33.8  30.0 - 36.0 g/dL   RDW 29.5 (*) 62.1 - 30.8 %   Platelets 207  150 - 400 K/uL   BASIC METABOLIC PANEL     Status: Abnormal   Collection Time    11/04/13  5:45 AM      Result Value Range   Sodium 142  135 - 145 mEq/L   Potassium 3.5  3.5 - 5.1 mEq/L   Chloride 105  96 - 112 mEq/L   CO2 23  19 - 32 mEq/L   Glucose, Bld 95  70 - 99 mg/dL   BUN 19  6 - 23 mg/dL   Creatinine, Ser 6.57 (*) 0.50 - 1.10 mg/dL   Calcium 9.2  8.4 - 84.6 mg/dL   GFR calc non Af Amer 35 (*) >90 mL/min   GFR calc Af Amer 41 (*) >90 mL/min  GLUCOSE, CAPILLARY     Status: None   Collection Time    11/04/13  8:07 AM      Result Value Range   Glucose-Capillary 98  70 - 99 mg/dL   Ct Head (brain) Wo Contrast  11/03/2013   CLINICAL DATA:  Confusion. Possible stroke.  EXAM: CT HEAD WITHOUT CONTRAST  TECHNIQUE: Contiguous axial images were obtained from the base of the skull through the vertex without intravenous contrast.  COMPARISON:  No comparison head CT. Prior sinus CT 10/12/2008.  FINDINGS: Mild motion degradation.  No intracranial hemorrhage.  Small vessel disease type changes without CT evidence of large acute infarct.  Small acute infarct of the left thalamus not excluded.  Mild atrophy without hydrocephalus.  No intracranial mass lesion noted on this unenhanced exam.  Hyperostosis frontalis interna.  Mild mucosal thickening medial aspect left maxillary sinus.  IMPRESSION: Mild motion degradation.  No intracranial hemorrhage.  Small vessel disease type changes without CT evidence of large acute infarct.  Small acute infarct of the left thalamus not excluded.  Mild atrophy without hydrocephalus.   Electronically Signed   By: Bridgett Larsson M.D.   On: 11/03/2013 15:35   Mr Maxine Glenn Head Wo Contrast  11/03/2013   CLINICAL DATA:  Expressive a aphasia. Confusion.  EXAM: MRI HEAD WITHOUT CONTRAST  MRA HEAD WITHOUT CONTRAST  TECHNIQUE: Multiplanar, multiecho pulse sequences of the  brain and surrounding structures were obtained without intravenous contrast. Angiographic images of the head were obtained using  MRA technique without contrast.  COMPARISON:  Head CT same day  FINDINGS: MRI HEAD FINDINGS  There are extensive chronic small vessel changes throughout the pons. No focal cerebellar insult.  There are multiple foci of acute infarction within the left hemisphere primarily in watershed distribution affecting this surface of the brain in the subcortical white matter from front to back. No large confluent area of infarction. No discernible hemorrhage. No mass lesion, hydrocephalus or extra-axial collection.  MRA HEAD FINDINGS  Both internal carotid arteries are widely patent into the brain. No siphon stenosis. The anterior and middle cerebral vessels are patent bilaterally. 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  Both vertebral arteries are widely patent with the right being dominant. No basilar stenosis. Posterior circulation branch vessels are normal. There is a large posterior communicating artery on the right.  IMPRESSION: Multiple acute infarctions scattered along the surface of the brain in the left hemisphere from front to back, watershed distribution. No swelling, hemorrhage or mass effect.  MR angiography does not show any major vessel occlusion or correctable proximal stenosis.   Electronically Signed   By: Paulina Fusi M.D.   On: 11/03/2013 18:30   Mr Brain Wo Contrast  11/03/2013   CLINICAL DATA:  Expressive a aphasia. Confusion.  EXAM: MRI HEAD WITHOUT CONTRAST  MRA HEAD WITHOUT CONTRAST  TECHNIQUE: Multiplanar, multiecho pulse sequences of the brain and surrounding structures were obtained without intravenous contrast. Angiographic images of the head were obtained using MRA technique without contrast.  COMPARISON:  Head CT same day  FINDINGS: MRI HEAD FINDINGS  There are extensive chronic small vessel changes throughout the pons. No focal cerebellar insult.  There are multiple foci of acute infarction within the left hemisphere primarily in watershed distribution affecting this  surface of the brain in the subcortical white matter from front to back. No large confluent area of infarction. No discernible hemorrhage. No mass lesion, hydrocephalus or extra-axial collection.  MRA HEAD FINDINGS  Both internal carotid arteries are widely patent into the brain. No siphon stenosis. The anterior and middle cerebral vessels are patent bilaterally. 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  Both vertebral arteries are widely patent with the right being dominant. No basilar stenosis. Posterior circulation branch vessels are normal. There is a large posterior communicating artery on the right.  IMPRESSION: Multiple acute infarctions scattered along the surface of the brain in the left hemisphere from front to back, watershed distribution. No swelling, hemorrhage or mass effect.  MR angiography does not show any major vessel occlusion or correctable proximal stenosis.   Electronically Signed   By: Paulina Fusi M.D.   On: 11/03/2013 18:30   Dg Chest Portable 1 View  11/03/2013   CLINICAL DATA:  Altered mental status.  EXAM: PORTABLE CHEST - 1 VIEW  COMPARISON:  04/04/2013  FINDINGS: Linear subsegmental atelectasis in the right lung base. No focal opacity on the left. Heart is upper limits normal in size. No effusions or acute bony abnormality.  IMPRESSION: Right base subsegmental atelectasis.   Electronically Signed   By: Charlett Nose M.D.   On: 11/03/2013 17:26    Assessment/Plan: Diagnosis: left brain watershed infarcts with substantial language deficits.  1. Does the need for close, 24 hr/day medical supervision in concert with the patient's rehab needs make it unreasonable for this patient to be served in a less  intensive setting? Yes 2. Co-Morbidities requiring supervision/potential complications: dm, htn, asthma 3. Due to bladder management, bowel management, safety, skin/wound care, disease management, medication administration, pain management and patient education,  does the patient require 24 hr/day rehab nursing? Yes 4. Does the patient require coordinated care of a physician, rehab nurse, PT (1-2 hrs/day, 5 days/week), OT (1-2 hrs/day, 5 days/week) and SLP (1-2 hrs/day, 5 days/week) to address physical and functional deficits in the context of the above medical diagnosis(es)? Yes Addressing deficits in the following areas: balance, endurance, locomotion, strength, transferring, bowel/bladder control, bathing, dressing, feeding, grooming, toileting, cognition, speech, language and psychosocial support 5. Can the patient actively participate in an intensive therapy program of at least 3 hrs of therapy per day at least 5 days per week? Yes 6. The potential for patient to make measurable gains while on inpatient rehab is good 7. Anticipated functional outcomes upon discharge from inpatient rehab are supervision with PT, supervision to min assist with OT, min to mod assist with SLP. 8. Estimated rehab length of stay to reach the above functional goals is: 14-18 days 9. Does the patient have adequate social supports to accommodate these discharge functional goals? Potentially 10. Anticipated D/C setting: Home 11. Anticipated post D/C treatments: HH therapy 12. Overall Rehab/Functional Prognosis: good  RECOMMENDATIONS: This patient's condition is appropriate for continued rehabilitative care in the following setting: CIR Patient has agreed to participate in recommended program. Potentially Note that insurance prior authorization may be required for reimbursement for recommended care.  Comment: Spoke with one of sons at length. It seems as if they are working toward putting support at home together for her. Rehab RN to follow up.   Ranelle Oyster, MD, [place] [facility]     11/04/2013

## 2013-11-04 NOTE — Progress Notes (Signed)
VASCULAR LAB PRELIMINARY  PRELIMINARY  PRELIMINARY  PRELIMINARY  Carotid duplex  completed.    Preliminary report:  Plaque disease noted bilaterally.  No significant ICA stenosis noted.  Vertebral artery flow antegrade.  Roxanne Panek, RVT 11/04/2013, 4:09 PM

## 2013-11-04 NOTE — Evaluation (Addendum)
Physical Therapy Evaluation Patient Details Name: [name] MRN: [id number] DOB: [birth year] Today's Date: 11/04/2013 Time: 0829-0900 PT Time Calculation (min): 31 min  PT Assessment / Plan / Recommendation History of Present Illness  Pt admitted with altered mental status and difficulty speaking. 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  Clinical Impression  Pt with significant cognitive deficits including impaired command follow, word finding difficulties, apraxia, impaired comprehension, sequencing, and problem solving. Pt also with noted R sided neglect and balance impairment. Pt is an excellent CIR candidate as pt was indep PTA and has an extremely supportive family than can assist upon d/c. Pt to benefit from CIR to achieve maximal functional recovery.     PT Assessment  Patient needs continued PT services    Follow Up Recommendations  CIR;Supervision/Assistance - 24 hour    Does the patient have the potential to tolerate intense rehabilitation      Barriers to Discharge        Equipment Recommendations   (TBD)    Recommendations for Other Services Rehab consult   Frequency Min 4X/week    Precautions / Restrictions Precautions Precautions: Fall Precaution Comments: pt with chair alarm Restrictions Weight Bearing Restrictions: No   Pertinent Vitals/Pain Doesn't appear to have pain      Mobility  Bed Mobility Bed Mobility: Supine to Sit;Sitting - Scoot to Edge of Bed Supine to Sit: 4: Min assist;HOB flat Sitting - Scoot to [place] of Bed: 4: Min guard Details for Bed Mobility Assistance: max tactile directional cues due to impaired sequencing Transfers Transfers: Sit to Stand;Stand to Sit Sit to Stand: 4: Min assist;With upper extremity assist;From bed;From toilet;4: Min guard Stand to Sit: 4: Min assist;With upper extremity assist;To toilet Details for Transfer Assistance: pt follows gestures better than verbal cues, con't to  require tactile cues to initiate task Ambulation/Gait Ambulation/Gait Assistance: 4: Min assist Ambulation Distance (Feet): 120 Feet Assistive device: 1 person hand held assist Ambulation/Gait Assistance Details: pt unsteady requiring pt to hold onto arm of son for stabilty. Pt with staggering, slow gait pattern. Gait Pattern: Step-through pattern;Decreased stride length Gait velocity: guarded/cautious Stairs: No    Exercises     PT Diagnosis: Difficulty walking  PT Problem List: Decreased strength;Decreased activity tolerance;Decreased balance;Decreased mobility;Decreased coordination;Decreased cognition;Decreased safety awareness PT Treatment Interventions: Gait training;DME instruction;Stair training;Functional mobility training;Therapeutic exercise;Balance training;Therapeutic activities;Neuromuscular re-education;Cognitive remediation     PT Goals(Current goals can be found in the care plan section) Acute Rehab PT Goals Patient Stated Goal: didn't state PT Goal Formulation: With patient/family Time For Goal Achievement: 11/11/13 Potential to Achieve Goals: Good  Visit Information  Last PT Received On: 11/04/13 Assistance Needed: +1 History of Present Illness: Pt admitted with altered mental status and difficulty speaking. 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       Prior Functioning  Home Living Family/patient expects to be discharged to:: Inpatient rehab Living Arrangements: Alone Available Help at Discharge: Family Type of Home: House Additional Comments: pt has very supportive family that can provide 24/7 assist/supervision if needed Prior Function Level of Independence: Independent Communication Communication: Receptive difficulties;Expressive difficulties Dominant Hand: Right    Cognition  Cognition Arousal/Alertness: Awake/alert Behavior During Therapy: Flat affect;Impulsive Overall Cognitive Status: Impaired/Different from  baseline Area of Impairment: Attention;Memory;Following commands;Safety/judgement;Awareness;Problem solving (unable to assess orientation due to word finding difficulty) Current Attention Level: Focused Memory: Decreased short-term memory Following Commands: Follows one step commands inconsistently Safety/Judgement: Decreased  awareness of safety;Decreased awareness of deficits Awareness: Intellectual Problem Solving: Slow processing;Decreased initiation;Difficulty sequencing;Requires verbal cues;Requires tactile cues General Comments: pt with apparent R sided inattention however son reports she has glaucoma in R eye as well however pt unable to find anything on R side ie. wash cloth, toliet paper. Pt with noted apraxia as well. Pt initiated tolieting however did not initiate retreiving toliet paper or hygiene. Pt responded "I already did that" when cued. Pt requires max directional verbal cues to sequence and stay on task    Extremity/Trunk Assessment Upper Extremity Assessment Upper Extremity Assessment: Difficult to assess due to impaired cognition;Overall [facility] for tasks assessed Lower Extremity Assessment Lower Extremity Assessment: Defer to PT evaluation Cervical / Trunk Assessment Cervical / Trunk Assessment: Normal   Balance Balance Balance Assessed: Yes Dynamic Sitting Balance Dynamic Sitting - Balance Support: No upper extremity supported;Feet supported Dynamic Sitting - Level of Assistance: 5: Stand by assistance Dynamic Sitting - Comments: pt attempted to don socks with maximal cueing requiring assist to maintain balance at EOB  End of Session PT - End of Session Equipment Utilized During Treatment: Gait belt Activity Tolerance: Patient tolerated treatment well Patient left: in chair;with call bell/phone within reach;with chair alarm set;with family/visitor present Nurse Communication: Mobility status  GP     Marcene Brawn 11/04/2013, 10:53 AM  Lewis Shock, PT,  DPT Pager #: [phone] Office #: [phone]

## 2013-11-04 NOTE — Progress Notes (Signed)
NIH could not be completed. Pt. Unable to follow commands. Moves all extremities but not to command, severely aphasic, few words spoken but clear speech.  Will attempt NIH again if pt. Is able to cooperate.

## 2013-11-04 NOTE — Clinical Social Work Psychosocial (Signed)
Clinical Social Work Department BRIEF PSYCHOSOCIAL ASSESSMENT 11/04/2013  Patient:  [name], [name]     Account Number:  [id number]     Admit date:  11/03/2013  Clinical Social Worker:  Robin Searing  Date/Time:  11/04/2013 11:44 AM  Referred by:  Physician  Date Referred:  11/04/2013 Referred for  SNF Placement   Other Referral:   Interview type:  Other - See comment Other interview type:   Patient's son at bedside    PSYCHOSOCIAL DATA Living Status:  ALONE Admitted from facility:   Level of care:   Primary support name:  sonHomero [name] [phone] Primary support relationship to patient:  FAMILY Degree of support available:   good    CURRENT CONCERNS Current Concerns  Post-Acute Placement   Other Concerns:    SOCIAL WORK ASSESSMENT / PLAN Patient's son at bedside- Speech Therapy is just finishing up with her- introduced self and role to them- patient veryy quiet and reserved-  CSW explained possible d/c options for her at d/c including CIR, SNF and HH/Outpatient therapies. Explained each option and have advised them we will begin to look at both CIR and SNF options as she is likely to benefit from an inpatient rehab stay at time of d/c.   Assessment/plan status:  Other - See comment Other assessment/ plan:   [facility] and [place] for SNF search   Information/referral to community resources:   SNF list  [facility] Medicare    PATIENT'S/FAMILY'S RESPONSE TO PLAN OF CARE: Patient and son are receptive and appreciative of CSW assistance- they are open to looking at SNF and I will proceed and advise. Son and family are very supportive and have had another family member who suffered a stroke have similar deficits- they appear eager and prepared to learn how to help Ms. [name] as she recovers/rehabilatates.    Reece Levy, MSW, Theresia Majors [phone]

## 2013-11-04 NOTE — Progress Notes (Signed)
NEURO HOSPITALIST PROGRESS NOTE   SUBJECTIVE:                                                                                                                        Patient is awake and sitting in a chair. Son at the bedside saying that he is seeing some improvement although at times his mother gets frustrated because she can say what she wants to say. MRI-DWI confirms the presence of multiple foci of acute infarction within the left hemisphere primarily in watershed distribution affecting this surface of the brain in the subcortical white matter from front to back. No large confluent area of infarction. No discernible hemorrhage. No mass lesion, hydrocephalus or extra-axial collection. TTE, CUS pending. On aspirin.    OBJECTIVE:                                                                                                                           Vital signs in last 24 hours: Temp:  [97.6 F (36.4 C)-99.6 F (37.6 C)] 98.3 F (36.8 C) (11/11 0605) Pulse Rate:  [77-98] 94 (11/11 0605) Resp:  [16-20] 20 (11/11 0605) BP: (115-171)/(40-89) 136/85 mmHg (11/11 0605) SpO2:  [10 %-100 %] 97 % (11/11 0605) Weight:  [67.495 kg (148 lb 12.8 oz)] 67.495 kg (148 lb 12.8 oz) (11/10 2017)  Intake/Output from previous day:   Intake/Output this shift: Total I/O In: 480 [P.O.:480] Out: -  Nutritional status: Cardiac  Past Medical History  Diagnosis Date  . Complication of anesthesia     OCC HALLUCINATIONS  . S/P angioplasty     10/12  . Angina     INFREG  . Asthma     NO PROBLEMS RECENTLY  . Shortness of breath   . Sleep apnea     06  . GI bleed     12  4 UNITS OF BLOOD  . Blood transfusion     LATE 12 4 UNITS FOR GI BLEED  . Chronic kidney disease     STONES  . Hypertension   . Arthritis   . Diabetes mellitus     OFF JANUVIA AND HUMALOG SINCE NOV  . Anemia   . 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 Kidney stone   . CAD  (coronary artery disease)   . Memory loss   . Diverticulosis     diverticulitis with rectal bleeding 2012  . Rheumatoid arthritis     Neurologic Exam:  Mental Status: Alert, awake, oriented. Receptive greater than expressive aphasia.  Cranial Nerves: II: Discs flat bilaterally; Visual fields grossly normal, pupils equal, round, reactive to light and accommodation III,IV, VI: ptosis not present, extra-ocular motions intact bilaterally V,VII: smile symmetric, facial light touch sensation normal bilaterally VIII: hearing normal bilaterally IX,X: gag reflex present XI: bilateral shoulder shrug XII: midline tongue extension without atrophy or fasciculations Motor: Right : Upper extremity   5/5    Left:     Upper extremity   5/5  Lower extremity   5/5     Lower extremity   5/5 Tone and bulk:normal tone throughout; no atrophy noted Sensory: Pinprick and light touch intact throughout, bilaterally Deep Tendon Reflexes:  Right: Upper Extremity   Left: Upper extremity   biceps (C-5 to C-6) 2/4   biceps (C-5 to C-6) 2/4 tricep (C7) 2/4    triceps (C7) 2/4 Brachioradialis (C6) 2/4  Brachioradialis (C6) 2/4  Lower Extremity Lower Extremity  quadriceps (L-2 to L-4) 2/4   quadriceps (L-2 to L-4) 2/4 Achilles (S1) 2/4   Achilles (S1) 2/4  Plantars: Right: downgoing   Left: downgoing Cerebellar: normal finger-to-nose,  normal heel-to-shin test Gait:  No ataxia. CV: pulses palpable throughout    Lab Results: Lab Results  Component Value Date/Time   CHOL 212* 11/04/2013  5:45 AM   Lipid Panel  Recent Labs  11/04/13 0545  CHOL 212*  TRIG 101  HDL 51  CHOLHDL 4.2  VLDL 20  LDLCALC 469*    Studies/Results: Ct Head (brain) Wo Contrast  11/03/2013   CLINICAL DATA:  Confusion. Possible stroke.  EXAM: CT HEAD WITHOUT CONTRAST  TECHNIQUE: Contiguous axial images were obtained from the base of the skull through the vertex without intravenous contrast.  COMPARISON:  No comparison  head CT. Prior sinus CT 10/12/2008.  FINDINGS: Mild motion degradation.  No intracranial hemorrhage.  Small vessel disease type changes without CT evidence of large acute infarct.  Small acute infarct of the left thalamus not excluded.  Mild atrophy without hydrocephalus.  No intracranial mass lesion noted on this unenhanced exam.  Hyperostosis frontalis interna.  Mild mucosal thickening medial aspect left maxillary sinus.  IMPRESSION: Mild motion degradation.  No intracranial hemorrhage.  Small vessel disease type changes without CT evidence of large acute infarct.  Small acute infarct of the left thalamus not excluded.  Mild atrophy without hydrocephalus.   Electronically Signed   By: Bridgett Larsson M.D.   On: 11/03/2013 15:35   Mr Maxine Glenn Head Wo Contrast  11/03/2013   CLINICAL DATA:  Expressive a aphasia. Confusion.  EXAM: MRI HEAD WITHOUT CONTRAST  MRA HEAD WITHOUT CONTRAST  TECHNIQUE: Multiplanar, multiecho pulse sequences of the brain and surrounding structures were obtained without intravenous contrast. Angiographic images of the head were obtained using MRA technique without contrast.  COMPARISON:  Head CT same day  FINDINGS: MRI HEAD FINDINGS  There are extensive chronic small vessel changes throughout the pons. No focal cerebellar insult.  There are multiple foci of acute infarction within the left hemisphere primarily in watershed distribution affecting this surface of the brain in the subcortical white matter from front to back. No large confluent area of infarction. No discernible hemorrhage. No mass lesion, hydrocephalus or extra-axial  collection.  MRA HEAD FINDINGS  Both internal carotid arteries are widely patent into the brain. No siphon stenosis. The anterior and middle cerebral vessels are patent bilaterally. 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  Both vertebral arteries are widely patent with the right being dominant. No basilar stenosis. Posterior circulation branch vessels  are normal. There is a large posterior communicating artery on the right.  IMPRESSION: Multiple acute infarctions scattered along the surface of the brain in the left hemisphere from front to back, watershed distribution. No swelling, hemorrhage or mass effect.  MR angiography does not show any major vessel occlusion or correctable proximal stenosis.   Electronically Signed   By: Paulina Fusi M.D.   On: 11/03/2013 18:30   Mr Brain Wo Contrast  11/03/2013   CLINICAL DATA:  Expressive a aphasia. Confusion.  EXAM: MRI HEAD WITHOUT CONTRAST  MRA HEAD WITHOUT CONTRAST  TECHNIQUE: Multiplanar, multiecho pulse sequences of the brain and surrounding structures were obtained without intravenous contrast. Angiographic images of the head were obtained using MRA technique without contrast.  COMPARISON:  Head CT same day  FINDINGS: MRI HEAD FINDINGS  There are extensive chronic small vessel changes throughout the pons. No focal cerebellar insult.  There are multiple foci of acute infarction within the left hemisphere primarily in watershed distribution affecting this surface of the brain in the subcortical white matter from front to back. No large confluent area of infarction. No discernible hemorrhage. No mass lesion, hydrocephalus or extra-axial collection.  MRA HEAD FINDINGS  Both internal carotid arteries are widely patent into the brain. No siphon stenosis. The anterior and middle cerebral vessels are patent bilaterally. 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  Both vertebral arteries are widely patent with the right being dominant. No basilar stenosis. Posterior circulation branch vessels are normal. There is a large posterior communicating artery on the right.  IMPRESSION: Multiple acute infarctions scattered along the surface of the brain in the left hemisphere from front to back, watershed distribution. No swelling, hemorrhage or mass effect.  MR angiography does not show any major vessel  occlusion or correctable proximal stenosis.   Electronically Signed   By: Paulina Fusi M.D.   On: 11/03/2013 18:30   Dg Chest Portable 1 View  11/03/2013   CLINICAL DATA:  Altered mental status.  EXAM: PORTABLE CHEST - 1 VIEW  COMPARISON:  04/04/2013  FINDINGS: Linear subsegmental atelectasis in the right lung base. No focal opacity on the left. Heart is upper limits normal in size. No effusions or acute bony abnormality.  IMPRESSION: Right base subsegmental atelectasis.   Electronically Signed   By: Charlett Nose M.D.   On: 11/03/2013 17:26    MEDICATIONS                                                                                                                       I have reviewed the patient's current medications.  ASSESSMENT/PLAN:  Global aphasia, receptive more than expressive, in the context of left MCA distribution embolic infarct. Stroke work up underway. Continue aspirin. Stroke team to resume care in am.  Wyatt Portela, MD [facility] [phone]  11/04/2013, 1:59 PM

## 2013-11-04 NOTE — Evaluation (Signed)
Occupational Therapy Evaluation Patient Details Name: [name] MRN: [id number] DOB: 1936-02-15 Today's Date: 11/04/2013 Time: 6440-3474 OT Time Calculation (min): 24 min  OT Assessment / Plan / Recommendation History of present illness Pt admitted with altered mental status and difficulty speaking. 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   Clinical Impression   Pt presents with below problem list. Pt appears to demonstrate significant cognitive deficits including impaired command follow, word finding difficulties, apraxia, impaired comprehension, sequencing, and problem solving. Pt also appears to demonstrate R sided inattention and balance impairment. Pt has supportive family that can assist upon d/c. Recommending CIR for additional rehab prior to d/c home.        OT Assessment  Patient needs continued OT Services    Follow Up Recommendations  CIR;Supervision/Assistance - 24 hour    Barriers to Discharge      Equipment Recommendations  Other (comment) (defer to next venue)    Recommendations for Other Services Rehab consult  Frequency  Min 2X/week    Precautions / Restrictions Precautions Precautions: Fall Precaution Comments: pt with chair alarm Restrictions Weight Bearing Restrictions: No   Pertinent Vitals/Pain No apparent distress.     ADL  Eating/Feeding: Moderate assistance Where Assessed - Eating/Feeding: Edge of bed Grooming: Wash/dry hands;Teeth care;Wash/dry face;Moderate assistance;Maximal assistance Where Assessed - Grooming: Supported standing;Other (comment) (also washed some of face sitting EOB-asked to wash bottom) Upper Body Bathing: Moderate assistance Where Assessed - Upper Body Bathing: Unsupported sitting Lower Body Bathing: Moderate assistance Where Assessed - Lower Body Bathing: Unsupported sit to stand Upper Body Dressing: Moderate assistance Where Assessed - Upper Body Dressing: Unsupported sitting Lower  Body Dressing: Moderate assistance Where Assessed - Lower Body Dressing: Unsupported sit to stand Toilet Transfer: Min guard;Minimal assistance Toilet Transfer Method: Sit to stand (stand to sit) Toilet Transfer Equipment: Comfort height toilet;Grab bars Toileting - Clothing Manipulation and Hygiene: Moderate assistance Where Assessed - Toileting Clothing Manipulation and Hygiene: Sit to stand from 3-in-1 or toilet Tub/Shower Transfer Method: Not assessed Equipment Used: Gait belt Transfers/Ambulation Related to ADLs: Min A for ambulation and Min guard/Min A for transfers. ADL Comments: Pt appears to demonstrate apraxia. Pt requiring several cues throughout session. Pt placing toothbrush up nose and then attempting to brush teeth with finger. Hand over hand to initiate and assist with tasks. OT started donning sock and gave cues for pt and then pt able to complete.   OT Diagnosis: Cognitive deficits;Disturbance of vision;Apraxia  OT Problem List: Decreased cognition;Decreased coordination;Impaired vision/perception;Impaired balance (sitting and/or standing);Decreased strength;Decreased activity tolerance;Decreased safety awareness;Decreased knowledge of use of DME or AE;Decreased knowledge of precautions OT Treatment Interventions: Self-care/ADL training;Neuromuscular education;DME and/or AE instruction;Therapeutic activities;Cognitive remediation/compensation;Visual/perceptual remediation/compensation;Patient/family education;Balance training   OT Goals(Current goals can be found in the care plan section) Acute Rehab OT Goals Patient Stated Goal: didn't state OT Goal Formulation: Patient unable to participate in goal setting Time For Goal Achievement: 11/11/13 Potential to Achieve Goals: Good ADL Goals Pt Will Perform Grooming: with set-up;with supervision;standing Pt Will Perform Upper Body Bathing: with set-up;with supervision;sitting Pt Will Perform Lower Body Bathing: with set-up;with  supervision;sit to/from stand Pt Will Perform Upper Body Dressing: with set-up;with supervision;sitting Pt Will Perform Lower Body Dressing: with set-up;with supervision;sit to/from stand Pt Will Transfer to Toilet: with supervision;ambulating;grab bars (comfort height toilet) Pt Will Perform Toileting - Clothing Manipulation and hygiene: with supervision;sit to/from stand  Visit Information  Last OT Received On: 11/04/13 Assistance Needed: +1 PT/OT Co-Evaluation/Treatment: Yes  History of Present Illness: Pt admitted with altered mental status and difficulty speaking. 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       Prior Functioning     Home Living Family/patient expects to be discharged to:: Inpatient rehab Living Arrangements: Alone Available Help at Discharge: Family Type of Home: House Additional Comments: pt has very supportive family that can provide 24/7 assist/supervision if needed Prior Function Level of Independence: Independent Communication Communication: Receptive difficulties;Expressive difficulties Dominant Hand: Right         Vision/Perception Vision - History Visual History: Glaucoma (in Rt eye) Vision - Assessment Additional Comments: appears to demonstrate Rt side inattention   Cognition  Cognition Arousal/Alertness: Awake/alert Behavior During Therapy: Flat affect;Impulsive Overall Cognitive Status: Impaired/Different from baseline Area of Impairment: Attention;Memory;Following commands;Safety/judgement;Awareness;Problem solving (unable to assess orientation due to word finding difficulty) Current Attention Level: Focused Memory: Decreased short-term memory Following Commands: Follows one step commands inconsistently Safety/Judgement: Decreased awareness of safety;Decreased awareness of deficits Awareness: Intellectual Problem Solving: Slow processing;Decreased initiation;Difficulty sequencing;Requires verbal  cues;Requires tactile cues General Comments: pt with apparent R sided inattention however son reports she has glaucoma in R eye as well however pt unable to find anything on R side ie. wash cloth, toliet paper. Pt with noted apraxia as well. Pt initiated tolieting however did not initiate retreiving toliet paper or hygiene. Pt responded "I already did that" when cued. Pt requires max directional verbal cues to sequence and stay on task    Extremity/Trunk Assessment Upper Extremity Assessment Upper Extremity Assessment: Difficult to assess due to impaired cognition;Overall WFL for tasks assessed Lower Extremity Assessment Lower Extremity Assessment: Defer to PT evaluation Cervical / Trunk Assessment Cervical / Trunk Assessment: Normal     Mobility Bed Mobility Bed Mobility: Supine to Sit;Sitting - Scoot to Edge of Bed Supine to Sit: 4: Min assist;HOB flat Sitting - Scoot to [place] of Bed: 4: Min guard Details for Bed Mobility Assistance: max tactile directional cues due to impaired sequencing Transfers Transfers: Sit to Stand;Stand to Sit Sit to Stand: 4: Min assist;With upper extremity assist;From bed;From toilet;4: Min guard Stand to Sit: 4: Min assist;With upper extremity assist;To toilet Details for Transfer Assistance: pt follows gestures better than verbal cues, con't to require tactile cues to initiate task     Exercise     Balance Balance Balance Assessed: Yes Dynamic Sitting Balance Dynamic Sitting - Balance Support: No upper extremity supported;Feet supported Dynamic Sitting - Level of Assistance: 4: Min assist Dynamic Sitting - Comments: pt attempted to don socks with maximal cueing requiring assist to maintain balance at EOB   End of Session OT - End of Session Equipment Utilized During Treatment: Gait belt Activity Tolerance: Patient tolerated treatment well Patient left: Other (comment) (with PT)  GO     Earlie Raveling OTR/L [phone] 11/04/2013, 11:00 AM

## 2013-11-04 NOTE — Clinical Social Work Placement (Signed)
Clinical Social Work Department CLINICAL SOCIAL WORK PLACEMENT NOTE 11/04/2013  Patient:  [name], [name]  Account Number:  [id number] Admit date:  11/03/2013  Clinical Social Worker:  Robin Searing  Date/time:  11/04/2013 11:59 AM  Clinical Social Work is seeking post-discharge placement for this patient at the following level of care:   SKILLED NURSING   (*CSW will update this form in Epic as items are completed)   11/04/2013  Patient/family provided with [facility] Department of Clinical Social Work's list of facilities offering this level of care within the geographic area requested by the patient (or if unable, by the patient's family).  11/04/2013  Patient/family informed of their freedom to choose among providers that offer the needed level of care, that participate in Medicare, Medicaid or managed care program needed by the patient, have an available bed and are willing to accept the patient.  11/04/2013  Patient/family informed of [facility]' ownership interest in [facility], as well as of the fact that they are under no obligation to receive care at this facility.  PASARR submitted to EDS on 11/04/2013 PASARR number received from [employer] on   FL2 transmitted to all facilities in geographic area requested by pt/family on  11/04/2013 FL2 transmitted to all facilities within larger geographic area on   Patient informed that his/her managed care company has contracts with or will negotiate with  certain facilities, including the following:     Patient/family informed of bed offers received:   Patient chooses bed at  Physician recommends and patient chooses bed at    Patient to be transferred to  on   Patient to be transferred to facility by   The following physician request were entered in Epic:   Additional Comments: Reece Levy, MSW, Theresia Majors [phone]

## 2013-11-04 NOTE — Evaluation (Signed)
Clinical/Bedside Swallow Evaluation Patient Details  Name: [name] MRN: [id number] Date of Birth: 1936/04/23  Today's Date: 11/04/2013 Time: 0927-1010 SLP Time Calculation (min): 43 min  Past Medical History:  Past Medical History  Diagnosis Date  . Complication of anesthesia     OCC HALLUCINATIONS  . S/P angioplasty     10/12  . Angina     INFREG  . Asthma     NO PROBLEMS RECENTLY  . Shortness of breath   . Sleep apnea     06  . GI bleed     12  4 UNITS OF BLOOD  . Blood transfusion     LATE 12 4 UNITS FOR GI BLEED  . Chronic kidney disease     STONES  . Hypertension   . Arthritis   . Diabetes mellitus     OFF JANUVIA AND HUMALOG SINCE NOV  . Anemia   . 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 Kidney stone   . CAD (coronary artery disease)   . Memory loss    Past Surgical History:  Past Surgical History  Procedure Laterality Date  . Coronary angioplasty    . Abdominal hysterectomy    . Hip arthroplasty      BIL  . Knee arthroplasty      LFT  . Eye surgery      CAT IOL LFT   HPI:  [name] is an 77 y.o. female who was last spoken to on Saturday night.  Patients family members, as well as friend, attempted to get into with her on early Sunday  But were unable to.  After her family members arrived from being out of town on Sunday evening they noted she was unable to follow commands and having significant problems getting her words out and she was off balance.  Currently patient is in the ER showing difficulty follow commands verbally and visually, able to get about 2 words out fluently then gets frustrated due to inability to get her thoughts out or having word substitution. MRI showsMultiple acute infarctions scattered along the surface of the brain in the left hemisphere from front to back, watershed distribution.   Assessment / Plan / Recommendation Clinical Impression  Pt demonstrates adequate oral and oropharyngeal function without  evident oral weakness despite CVA. Pt is impulsive and does drink water excessively fast with one cough episode following over 6 oz of intake. With small sips swallow is timely without evidence of aspriation. Pt will need full supervision with PO intake for slow rate and attention to right during self feeding. SLP will f/u x1 for tolerance and otherwise focus on language and cognition.     Aspiration Risk  Mild    Diet Recommendation Regular;Thin liquid   Liquid Administration via: Cup;Straw Medication Administration: Whole meds with liquid Supervision: Patient able to self feed;Full supervision/cueing for compensatory strategies Compensations: Slow rate;Small sips/bites Postural Changes and/or Swallow Maneuvers: Seated upright 90 degrees    Other  Recommendations Oral Care Recommendations: Oral care BID   Follow Up Recommendations  Inpatient Rehab    Frequency and Duration min 2x/week  2 weeks   Pertinent Vitals/Pain NA    SLP Swallow Goals     Swallow Study Prior Functional Status       General Date of Onset: 11/02/13 HPI: [name] is an 77 y.o. female who was last spoken to on Saturday night.  Patients family members, as well as friend,  attempted to get into with her on early Sunday  But were unable to.  After her family members arrived from being out of town on Sunday evening they noted she was unable to follow commands and having significant problems getting her words out and she was off balance.  Currently patient is in the ER showing difficulty follow commands verbally and visually, able to get about 2 words out fluently then gets frustrated due to inability to get her thoughts out or having word substitution. MRI showsMultiple acute infarctions scattered along the surface of the brain in the left hemisphere from front to back, watershed distribution. Type of Study: Bedside swallow evaluation Diet Prior to this Study: NPO Temperature Spikes Noted: No Respiratory  Status: Room air History of Recent Intubation: No Behavior/Cognition: Alert;Cooperative;Confused;Requires cueing Self-Feeding Abilities: Able to feed self;Needs assist Patient Positioning: Upright in chair Baseline Vocal Quality: Clear Volitional Cough: Cognitively unable to elicit Volitional Swallow: Unable to elicit    Oral/Motor/Sensory Function Overall Oral Motor/Sensory Function: Appears within functional limits for tasks assessed   Ice Chips     Thin Liquid Thin Liquid: Impaired Presentation: Straw;Self Fed;Cup Pharyngeal  Phase Impairments: Cough - Immediate (with very large consecutive, impulsive intake)    Nectar Thick Nectar Thick Liquid: Not tested   Honey Thick Honey Thick Liquid: Not tested   Puree Puree: Within functional limits   Solid   GO    Solid: Within functional limits      [facility], MA CCC-SLP [phone]  Stela Iwasaki, Riley Nearing 11/04/2013,10:15 AM

## 2013-11-04 NOTE — Evaluation (Signed)
Speech Language Pathology Evaluation Patient Details Name: [name] MRN: [id number] DOB: Sep 18, 1936 Today's Date: 11/04/2013 Time: 0927-1010 SLP Time Calculation (min): 43 min  Problem List:  Patient Active Problem List   Diagnosis Date Noted  . Acute CVA (cerebrovascular accident) 11/03/2013  . Diverticulosis 04/04/2013  . Bright red blood per rectum 04/04/2013  . Blood loss anemia 04/04/2013  . Leukocytosis 04/04/2013  . Arthritis 04/04/2013  . ALLERGIC RHINITIS 02/21/2010  . DIABETES MELLITUS, TYPE II 02/15/2010  . HYPERLIPIDEMIA 02/15/2010  . OBSTRUCTIVE SLEEP APNEA 02/15/2010  . HYPERTENSION 02/15/2010  . RHINOSINUSITIS, CHRONIC 02/15/2010  . ASTHMA 02/15/2010   Past Medical History:  Past Medical History  Diagnosis Date  . Complication of anesthesia     OCC HALLUCINATIONS  . S/P angioplasty     10/12  . Angina     INFREG  . Asthma     NO PROBLEMS RECENTLY  . Shortness of breath   . Sleep apnea     06  . GI bleed     12  4 UNITS OF BLOOD  . Blood transfusion     LATE 12 4 UNITS FOR GI BLEED  . Chronic kidney disease     STONES  . Hypertension   . Arthritis   . Diabetes mellitus     OFF JANUVIA AND HUMALOG SINCE NOV  . Anemia   . 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 Kidney stone   . CAD (coronary artery disease)   . Memory loss    Past Surgical History:  Past Surgical History  Procedure Laterality Date  . Coronary angioplasty    . Abdominal hysterectomy    . Hip arthroplasty      BIL  . Knee arthroplasty      LFT  . Eye surgery      CAT IOL LFT   HPI:  [name] is an 77 y.o. female who was last spoken to on Saturday night.  Patients family members, as well as friend, attempted to get into with her on early Sunday  But were unable to.  After her family members arrived from being out of town on Sunday evening they noted she was unable to follow commands and having significant problems getting her words out and she was off  balance.  Currently patient is in the ER showing difficulty follow commands verbally and visually, able to get about 2 words out fluently then gets frustrated due to inability to get her thoughts out or having word substitution. MRI showsMultiple acute infarctions scattered along the surface of the brain in the left hemisphere from front to back, watershed distribution.   Assessment / Plan / Recommendation Clinical Impression  Pt demonstrates moderate cognitive lingusitic deficits. Comprehension of single words and phrases is improved with cues to attend visually to speaker and task, tactile cues needed to initiate following commands. Expressive language more severely impaired; pt is not fluent, able to say 1-2 automatic word phrases, then struggles with word finding and perseveration. Pt has no awareness. With phonemic and cues to visually attend to speaker pt was able to name 5 familiar objects. Cognition also impaired with right intattention, impaired selective attention and some ideational apraxia with functional tasks. Pt is also impulsive and will need full supervision due to awareness. Recommend CIR at d/c, SLP will continue to follow acutely to faciliate functional communication.     SLP Assessment  Patient needs continued Speech [facility] Pathology Services  Follow Up Recommendations  Inpatient Rehab    Frequency and Duration min 2x/week  2 weeks   Pertinent Vitals/Pain NA   SLP Goals  SLP Goals Potential to Achieve Goals: Good Progress/Goals/Alternative treatment plan discussed with pt/caregiver and they: Agree  SLP Evaluation Prior Functioning  Cognitive/Linguistic Baseline: Within functional limits Type of Home: House Available Help at Discharge: Family   Cognition  Overall Cognitive Status: Impaired/Different from baseline Arousal/Alertness: Awake/alert Orientation Level: Oriented to person;Oriented to place;Disoriented to time;Disoriented to situation Attention:  Sustained;Selective Sustained Attention: Impaired Sustained Attention Impairment: Verbal complex;Functional complex Selective Attention: Impaired Selective Attention Impairment: Verbal basic;Functional basic Memory:  (UTA due to aphasia) Awareness: Impaired Awareness Impairment: Intellectual impairment;Emergent impairment Problem Solving: Impaired Problem Solving Impairment: Verbal basic;Functional basic Executive Function: Self Monitoring;Self Correcting;Initiating Initiating: Impaired Initiating Impairment: Verbal basic;Functional basic Self Monitoring: Impaired Self Monitoring Impairment: Verbal basic;Functional basic Self Correcting: Impaired Self Correcting Impairment: Verbal basic;Functional basic Safety/Judgment: Impaired    Comprehension  Auditory Comprehension Overall Auditory Comprehension: Impaired Yes/No Questions: Impaired Basic Biographical Questions: 0-25% accurate Commands: Impaired One Step Basic Commands: 25-49% accurate Conversation: Simple Interfering Components: Attention EffectiveTechniques: Visual/Gestural cues;Repetition;Stressing words Visual Recognition/Discrimination Discrimination: Exceptions to [facility] Common Objects: Able in field of 3 (with moderate cues) Reading Comprehension Reading Status: Impaired Word level: Impaired Sentence Level: Impaired    Expression Expression Primary Mode of Expression: Verbal Verbal Expression Overall Verbal Expression: Impaired Initiation: Impaired Automatic Speech: Social Response;Counting (counted to 6 then perseverative, paraphasias) Level of Generative/Spontaneous Verbalization: Word;Phrase Repetition: Impaired Level of Impairment: Word level Naming: Impairment Responsive: Not tested Confrontation: Impaired Common Objects: Able in field of 3 (with moderate cues) Convergent: Not tested Divergent: Not tested Verbal Errors: Phonemic paraphasias;Perseveration;Not aware of errors Interfering Components:  Attention Effective Techniques: Phonemic cues;Articulatory cues;Semantic cues;Sentence completion Written Expression Dominant Hand: Right Written Expression: Not tested   Oral / Motor Oral Motor/Sensory Function Overall Oral Motor/Sensory Function: Appears within functional limits for tasks assessed Motor Speech Overall Motor Speech: Appears within functional limits for tasks assessed   GO    Harlon Ditty, MA CCC-SLP [phone]  Claudine Mouton 11/04/2013, 10:32 AM

## 2013-11-05 DIAGNOSIS — D72829 Elevated white blood cell count, unspecified: Secondary | ICD-10-CM

## 2013-11-05 LAB — BASIC METABOLIC PANEL
BUN: 16 mg/dL (ref 6–23)
Calcium: 8.9 mg/dL (ref 8.4–10.5)
Chloride: 109 mEq/L (ref 96–112)
GFR calc Af Amer: 56 mL/min — ABNORMAL LOW (ref >90)
GFR calc non Af Amer: 48 mL/min — ABNORMAL LOW (ref >90)
Potassium: 3.5 mEq/L (ref 3.5–5.1)
Sodium: 142 mEq/L (ref 135–145)

## 2013-11-05 LAB — GLUCOSE, CAPILLARY: Glucose-Capillary: 104 mg/dL — ABNORMAL HIGH (ref 70–99)

## 2013-11-05 MED ORDER — SIMVASTATIN 80 MG PO TABS
80.0000 mg | ORAL_TABLET | Freq: Every day | ORAL | Status: DC
  Administered 2013-11-05 – 2013-11-06 (×2): 80 mg via ORAL
  Filled 2013-11-05 (×3): qty 1

## 2013-11-05 NOTE — Progress Notes (Signed)
[facility] PROGRESS NOTE  [name] ZOX:[id number] DOB: 1936/07/15 DOA: 11/03/2013 PCP: Stacey Blade, MD Interim history  This is a 77 year old female history of hypertension, hyperlipidemia, diabetes, coronary disease that presented with a fascia and mental status changes. It seems the patient did not go to church on Sunday so members went to check on her and found her aphasic with the unsteady gait as well as inability to follow commands. MRI did show acute infarction left hemisphere. Neurology has been consulted patient is currently on aspirin therapy. CIR has been consulted.  Assessment/Plan:  Acute CVA, with aphasia, acute encephalopathy  -MRI: Multiple foci of acute infarction within the left hemisphere primarily in watershed distribution affecting this surface of the brain in the subcortical white matter from front to back  -Neurology consulted  -Continue Aspirin therapy, with plans to switch to Plavix on discharge -PT and OT consulted and recommended CIR   Acute Kidney Injury  - Most likely due to dehydration - Creatinine within normal limits after IVF rehydration.  Diabetes Mellitus, type 2  -Hb A1C-7.0 (03/2013)  -Will hold metformin due to Cr  Continue ISS and CBG   CAD  -Currently chest pain free  -Continue metoprolol, ASA, simvastatin   HTN  -Will allow for permissive HTN  -Will continue metoprolol, however hold micardis  Code Status: full Family Communication: Discussed with patient at bedside Disposition Plan: Rehab on discharge   Consultants:  Neurology  Procedures:  None  Antibiotics:  None  HPI/Subjective: No new complaints.   Objective: Filed Vitals:   11/05/13 1809  BP: 138/59  Pulse: 77  Temp: 98 F (36.7 C)  Resp: 18    Intake/Output Summary (Last 24 hours) at 11/05/13 1900 Last data filed at 11/05/13 1700  Gross per 24 hour  Intake    600 ml  Output      0 ml  Net    600 ml   Filed Weights   11/03/13 1307 11/03/13  2017  Weight: 67.161 kg (148 lb 1 oz) 67.495 kg (148 lb 12.8 oz)    Exam:   General:  NAD, alert and awake  Cardiovascular: RRR, no MRG  Respiratory: CTA BL no wheezes  Abdomen: soft, NT, ND  Musculoskeletal: no cyanosis   Data Reviewed: Basic Metabolic Panel:  Recent Labs Lab 11/03/13 1310 11/03/13 2320 11/04/13 0545 11/05/13 0458  NA 139  --  142 142  K 3.7  --  3.5 3.5  CL 100  --  105 109  CO2 25  --  23 21  GLUCOSE 133*  --  95 92  BUN 14  --  19 16  CREATININE 1.34* 1.30* 1.41* 1.09  CALCIUM 9.8  --  9.2 8.9   Liver Function Tests:  Recent Labs Lab 11/03/13 1310  AST 20  ALT 13  ALKPHOS 118*  BILITOT 1.0  PROT 8.1  ALBUMIN 4.2   No results found for this basename: LIPASE, AMYLASE,  in the last 168 hours No results found for this basename: AMMONIA,  in the last 168 hours CBC:  Recent Labs Lab 11/03/13 1310 11/03/13 2320 11/04/13 0545  WBC 8.5 8.6 7.8  HGB 13.3 12.4 12.0  HCT 37.9 36.3 35.5*  MCV 83.7 84.0 84.7  PLT 237 202 207   Cardiac Enzymes:  Recent Labs Lab 11/03/13 1540  TROPONINI <0.30   BNP (last 3 results) No results found for this basename: PROBNP,  in the last 8760 hours CBG:  Recent Labs Lab 11/04/13 1636  11/04/13 2109 11/05/13 0639 11/05/13 1140 11/05/13 1651  GLUCAP 120* 129* 101* 104* 117*    No results found for this or any previous visit (from the past 240 hour(s)).   Studies: No results found.  Scheduled Meds: . antiseptic oral rinse  15 mL Mouth Rinse BID  . aspirin EC  325 mg Oral Daily  . brimonidine  1 drop Both Eyes TID  . enoxaparin (LOVENOX) injection  40 mg Subcutaneous Q24H  . insulin aspart  0-9 Units Subcutaneous TID WC  . metoprolol succinate  25 mg Oral Daily  . simvastatin  80 mg Oral QHS   Continuous Infusions: . sodium chloride 100 mL/hr at 11/05/13 1604    Principal Problem:   Acute CVA (cerebrovascular accident) Active Problems:   DIABETES MELLITUS, TYPE II    HYPERLIPIDEMIA   HYPERTENSION    Time spent: > 35 minutes    [name]  [facility] Pager [phone] If 7PM-7AM, please contact night-coverage at [email or web address], password [facility] 11/05/2013, 7:00 PM  LOS: 2 days

## 2013-11-05 NOTE — Progress Notes (Signed)
Physical Therapy Treatment Patient Details Name: [name] MRN: [id number] DOB: 02/17/1936 Today's Date: 11/05/2013 Time: 1191-4782 PT Time Calculation (min): 29 min  PT Assessment / Plan / Recommendation  History of Present Illness Pt admitted with altered mental status and difficulty speaking. 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   PT Comments   Pt progressing with ambulation and transfers today; pt easily distracted with environment with ambulation thus requires assistance for safety and balance.  Pt will benefit from further PT to regain functional abilities.  Pt planning on d/c'ing to SNF instead of CIR because family isn't available 24/7 upon d/c.  Follow Up Recommendations  SNF     Does the patient have the potential to tolerate intense rehabilitation     Barriers to Discharge        Equipment Recommendations       Recommendations for Other Services    Frequency Min 4X/week   Progress towards PT Goals Progress towards PT goals: Progressing toward goals  Plan Discharge plan needs to be updated    Precautions / Restrictions Precautions Precautions: Fall Precaution Comments: pt with chair alarm Restrictions Weight Bearing Restrictions: No   Pertinent Vitals/Pain no apparent distress     Mobility  Bed Mobility Details for Bed Mobility Assistance: Pt received in recliner Transfers Transfers: Sit to Stand;Stand to Sit (x7 from recliner; fewer cues with each rep) Sit to Stand: 4: Min guard;From chair/3-in-1;With armrests Stand to Sit: 4: Min guard;To chair/3-in-1;With armrests Details for Transfer Assistance: x7 sit<>stands with more carryover of technique wih each rep Ambulation/Gait Ambulation/Gait Assistance: 4: Min guard Ambulation Distance (Feet): 250 Feet Assistive device: Other (Comment);None (IV Pole) Ambulation/Gait Assistance Details: Pt reached for IV pole and would hold on to it for stability Gait Pattern:  Step-through pattern;Decreased stride length Gait velocity: guarded/cautious Stairs: No    Exercises     PT Diagnosis:    PT Problem List:   PT Treatment Interventions:     PT Goals (current goals can now be found in the care plan section)    Visit Information  Last PT Received On: 11/05/13 Assistance Needed: +1 History of Present Illness: Pt admitted with altered mental status and difficulty speaking. 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    Subjective Data      Cognition  Cognition Arousal/Alertness: Awake/alert Behavior During Therapy: Flat affect Overall Cognitive Status: Impaired/Different from baseline Area of Impairment: Attention;Memory;Following commands;Safety/judgement;Awareness;Problem solving;Orientation Orientation Level: Disoriented to;Time Current Attention Level: Focused Memory: Decreased short-term memory Following Commands: Follows one step commands inconsistently;Follows one step commands with increased time Safety/Judgement: Decreased awareness of safety;Decreased awareness of deficits Awareness: Intellectual Problem Solving: Slow processing;Decreased initiation;Difficulty sequencing;Requires verbal cues;Requires tactile cues General Comments: Pt following 75% of one step commands with increased time; pt able to answer yes/no questions and say her son's names with increased time    Balance  Balance Balance Assessed: Yes Static Standing Balance Static Standing - Balance Support: During functional activity;No upper extremity supported Static Standing - Level of Assistance: 5: Stand by assistance Static Standing - Comment/# of Minutes: ; some unsteadiness and leaning into wall for support despite cues Dynamic Standing Balance Dynamic Standing - Balance Support: Left upper extremity supported;Bilateral upper extremity supported;No upper extremity supported;During functional activity Dynamic Standing - Level of  Assistance: 5: Stand by assistance Dynamic Standing - Comments: 2 minutes; pt was most steady with 1 or B UE support; no LOB  End of Session PT - End of Session Equipment Utilized During Treatment: Gait belt Activity Tolerance: Patient tolerated treatment well Patient left: in chair;with call bell/phone within reach;with chair alarm set;with family/visitor present Nurse Communication: Mobility status   GP     Ernestina Columbia, SPTA 11/05/2013, 1:13 PM

## 2013-11-05 NOTE — Progress Notes (Signed)
Rehab admissions - Evaluated for possible admission.  I spoke with patient and her two sons.  Sons both work.  They have no caregiver support after a potential inpatient rehab stay.  Sons do not want to burden the daughter-in-laws with caregiver responsibility.  Sons prefer community SNF to allow sufficient recovery time for patient to return to independent level prior to home.  Recommend pursuit of SNF.  Call me for questions.  #[phone]

## 2013-11-05 NOTE — Progress Notes (Signed)
CSW gave bed offers to patient's son Homero Fellers and also spoke to daughter-in-law [place] via phone.  Family will discuss bed offers and contact CSW tomorrow morning with decision. CIR is unable to offer a bed;  Clinicals sent to Caleen Essex- RN Liason for [facility] to review.  CSW will continue to follow.  Lorri Frederick. [place]  [phone]

## 2013-11-05 NOTE — Progress Notes (Signed)
Stroke Team Progress Note  HISTORY [name] is an 77 y.o. female who was last spoken to on Saturday night 11/01/2013 . Patients family members, as well as friend, attempted to get with her on early Sunday But were unable to. After her family members arrived from being out of town on Sunday evening they noted she was unable to follow commands and having significant problems getting her words out and she was off balance. Patient was brought to the ED 11/03/2013 showing difficulty follow commands verbally and visually, able to get about 2 words out fluently then gets frustrated due to inability to get her thoughts out or having word substitution. Patient was not a TPA candidate secondary to delay in arrival, unknown time of onset. She was admitted for further evaluation and treatment.  SUBJECTIVE Her son is at the bedside.  Overall she feels her condition is slightly improved, she is now able to speak her name. Patient lived alone prior to admission. An older son arrived during rounds.  OBJECTIVE Most recent Vital Signs: Filed Vitals:   11/04/13 1807 11/04/13 2130 11/05/13 0200 11/05/13 0549  BP: 114/59 126/52 140/73 148/78  Pulse: 68 67 68 72  Temp: 98 F (36.7 C) 98.1 F (36.7 C) 97.8 F (36.6 C) 97.9 F (36.6 C)  TempSrc: Oral Oral Oral Oral  Resp: 18 18 18 18   Height:      Weight:      SpO2: 99% 98% 99% 100%   CBG (last 3)   Recent Labs  11/04/13 1636 11/04/13 2109 11/05/13 0639  GLUCAP 120* 129* 101*   IV Fluid Intake:   . sodium chloride 100 mL/hr at 11/05/13 0600   MEDICATIONS  . antiseptic oral rinse  15 mL Mouth Rinse BID  . aspirin EC  325 mg Oral Daily  . brimonidine  1 drop Both Eyes TID  . enoxaparin (LOVENOX) injection  40 mg Subcutaneous Q24H  . insulin aspart  0-9 Units Subcutaneous TID WC  . metoprolol succinate  25 mg Oral Daily  . simvastatin  40 mg Oral QHS   PRN:  albuterol, nitroGLYCERIN, senna-docusate  Diet:  Cardiac thin liquids Activity:    DVT Prophylaxis:  SCDs, Lovenox 40 mg sq daily   CLINICALLY SIGNIFICANT STUDIES Basic Metabolic Panel:   Recent Labs Lab 11/04/13 0545 11/05/13 0458  NA 142 142  K 3.5 3.5  CL 105 109  CO2 23 21  GLUCOSE 95 92  BUN 19 16  CREATININE 1.41* 1.09  CALCIUM 9.2 8.9   Liver Function Tests:   Recent Labs Lab 11/03/13 1310  AST 20  ALT 13  ALKPHOS 118*  BILITOT 1.0  PROT 8.1  ALBUMIN 4.2   CBC:   Recent Labs Lab 11/03/13 2320 11/04/13 0545  WBC 8.6 7.8  HGB 12.4 12.0  HCT 36.3 35.5*  MCV 84.0 84.7  PLT 202 207   Coagulation: No results found for this basename: LABPROT, INR,  in the last 168 hours Cardiac Enzymes:   Recent Labs Lab 11/03/13 1540  TROPONINI <0.30   Urinalysis:   Recent Labs Lab 11/03/13 1351  COLORURINE YELLOW  LABSPEC 1.013  PHURINE 6.0  GLUCOSEU NEGATIVE  HGBUR TRACE*  BILIRUBINUR NEGATIVE  KETONESUR NEGATIVE  PROTEINUR NEGATIVE  UROBILINOGEN 1.0  NITRITE NEGATIVE  LEUKOCYTESUR TRACE*   Lipid Panel    Component Value Date/Time   CHOL 212* 11/04/2013 0545   TRIG 101 11/04/2013 0545   HDL 51 11/04/2013 0545   CHOLHDL 4.2 11/04/2013 0545  VLDL 20 11/04/2013 0545   LDLCALC 141* 11/04/2013 0545   HgbA1C  Lab Results  Component Value Date   HGBA1C 6.5* 11/04/2013    Urine Drug Screen:   No results found for this basename: labopia,  cocainscrnur,  labbenz,  amphetmu,  thcu,  labbarb    Alcohol Level: No results found for this basename: ETH,  in the last 168 hours     CT of the brain  11/03/2013   Mild motion degradation.  No intracranial hemorrhage.  Small vessel disease type changes without CT evidence of large acute infarct.  Small acute infarct of the left thalamus not excluded.  Mild atrophy without hydrocephalus.    MRI of the brain  11/03/2013    Multiple acute infarctions scattered along the surface of the brain in the left hemisphere from front to back, watershed distribution. No swelling, hemorrhage or mass  effect.   MRA of the brain  11/03/2013   MR angiography does not show any major vessel occlusion or correctable proximal stenosis.    2D Echocardiogram  EF 60-65% with no source of embolus.   Carotid Doppler  No evidence of hemodynamically significant internal carotid artery stenosis. Vertebral artery flow is antegrade.   CXR  11/03/2013   Right base subsegmental atelectasis.   EKG  normal sinus rhythm.   Therapy Recommendations CIR  Physical Exam   Pleasant elderly african american lady not in distress.Awake alert. Afebrile. Head is nontraumatic. Neck is supple without bruit. Hearing is normal. Cardiac exam no murmur or gallop. Lungs are clear to auscultation. Distal pulses are well felt. Neurological Exam : Awake alert and global aphasia. Speaks single words and able to name herself and son. Not able to speak sentences. Follows simple midline commands and occasional one-step commands only. Not able to name or repeat. Extraocular movements are full range without nystagmus. Blinks to threat bilaterally. No facial weakness. Tongue is midline. Motor system exam reveals no upper or lower extremity drift. Symmetric and equal strength in all 4 extremities. Not cooperative for detailed final to skin testing. Withdraws to painful stimuli in all 4 extremities. Gait was not tested. ASSESSMENT [name] is a 77 y.o. female presenting with expressive aphasia.  Imaging confirms patchy left MCA infarcts. Infarcts felt to be embolic secondary to unknown etiology.  On aspirin 81 mg orally every day prior to admission. Now on aspirin 325 mg orally every day for secondary stroke prevention. Patient with resultant expressive greater than receptive aphasia, able to follow 1 step commands only, receptive aphasia, there has been some improvement over the past 2 days. Work up underway.  Hypertension  Hyperlipidemia, LDL 141, on zocor 40 PTA, now on zocor, goal LDL < 100 (< 70 for diabetics) Diabetes,  HgbA1c 6.5, goal < 7.0  Acute kidney injury CAD -   Hospital day # 2  TREATMENT/PLAN  Continue aspirin 325 mg orally every day for secondary stroke prevention for now. If TEE unrevealing, will change to plavix 75 mg at time of discharge.  Increase zocor dose TEE to look for embolic source. Arranged with [facility] for tomorrow.  If positive for PFO (patent foramen ovale), check bilateral lower extremity venous dopplers to rule out DVT as possible source of stroke. I have made her NPO. If TEE negative, [place] cardiologist will place implantable loop recorder to evaluate for atrial fibrillation as etiology of stroke. This has been explained to patient/family by Dr. Pearlean Brownie and they are agreeable.   Rehab at time  of discharge  Annie Main, MSN, RN, ANVP-BC, ANP-BC, [facility] Pager: [phone] 11/05/2013 10:14 AM  I have personally obtained a history, examined the patient, evaluated imaging results, and formulated the assessment and plan of care. I agree with the above. Delia Heady, MD

## 2013-11-05 NOTE — Progress Notes (Signed)
Agree with SPTA.    Alys Dulak, PT [phone]  

## 2013-11-06 ENCOUNTER — Encounter (HOSPITAL_COMMUNITY)
Admission: EM | Disposition: A | Discharge status: Skilled Nursing Facility | Payer: Self-pay | Source: Home / Self Care | Attending: Internal Medicine

## 2013-11-06 DIAGNOSIS — K5730 Diverticulosis of large intestine without perforation or abscess without bleeding: Secondary | ICD-10-CM

## 2013-11-06 DIAGNOSIS — I6350 Cerebral infarction due to unspecified occlusion or stenosis of unspecified cerebral artery: Secondary | ICD-10-CM

## 2013-11-06 DIAGNOSIS — M129 Arthropathy, unspecified: Secondary | ICD-10-CM

## 2013-11-06 DIAGNOSIS — G4733 Obstructive sleep apnea (adult) (pediatric): Secondary | ICD-10-CM

## 2013-11-06 HISTORY — PX: LOOP RECORDER IMPLANT: SHX5477

## 2013-11-06 LAB — GLUCOSE, CAPILLARY
Glucose-Capillary: 95 mg/dL (ref 70–99)
Glucose-Capillary: 90 mg/dL (ref 70–99)

## 2013-11-06 SURGERY — LOOP RECORDER IMPLANT
Anesthesia: LOCAL | Laterality: Left

## 2013-11-06 SURGERY — CANCELLED PROCEDURE

## 2013-11-06 MED ORDER — LIDOCAINE-EPINEPHRINE 1 %-1:100000 IJ SOLN
INTRAMUSCULAR | Status: AC
  Filled 2013-11-06: qty 1

## 2013-11-06 MED ORDER — MIDAZOLAM HCL 10 MG/2ML IJ SOLN
INTRAMUSCULAR | Status: DC | PRN
  Administered 2013-11-06: 1 mg via INTRAVENOUS
  Administered 2013-11-06: .5 mg via INTRAVENOUS
  Administered 2013-11-06: 1 mg via INTRAVENOUS
  Administered 2013-11-06: 1.5 mg via INTRAVENOUS
  Administered 2013-11-06: 1 mg via INTRAVENOUS

## 2013-11-06 MED ORDER — FENTANYL CITRATE 0.05 MG/ML IJ SOLN
INTRAMUSCULAR | Status: DC | PRN
  Administered 2013-11-06 (×2): 25 ug via INTRAVENOUS

## 2013-11-06 MED ORDER — CLOPIDOGREL BISULFATE 75 MG PO TABS
75.0000 mg | ORAL_TABLET | Freq: Every day | ORAL | Status: DC
  Administered 2013-11-06 – 2013-11-07 (×2): 75 mg via ORAL
  Filled 2013-11-06 (×3): qty 1

## 2013-11-06 MED ORDER — MIDAZOLAM HCL 5 MG/ML IJ SOLN
INTRAMUSCULAR | Status: AC
  Filled 2013-11-06: qty 2

## 2013-11-06 MED ORDER — BUTAMBEN-TETRACAINE-BENZOCAINE 2-2-14 % EX AERO
INHALATION_SPRAY | CUTANEOUS | Status: DC | PRN
  Administered 2013-11-06: 2 via TOPICAL

## 2013-11-06 MED ORDER — SODIUM CHLORIDE 0.9 % IV SOLN
INTRAVENOUS | Status: DC
  Administered 2013-11-06: 09:00:00 via INTRAVENOUS

## 2013-11-06 MED ORDER — FENTANYL CITRATE 0.05 MG/ML IJ SOLN
INTRAMUSCULAR | Status: AC
  Filled 2013-11-06: qty 2

## 2013-11-06 NOTE — Progress Notes (Signed)
PT Cancellation Note  Patient Details Name: SHAVETTE SHOAFF MRN: [id number] DOB: Aug 06, 1936   Cancelled Treatment:    Reason Eval/Treat Not Completed: Patient at procedure or test/unavailable   Dontario Evetts, Adline Potter 11/06/2013, 1:49 PM

## 2013-11-06 NOTE — Consult Note (Signed)
ELECTROPHYSIOLOGY CONSULT NOTE  Patient ID: [name] MRN: [id number], DOB/AGE: 1936-01-09   Admit date: 11/03/2013 Date of Consult: 11/06/2013  Primary Physician: Willey Blade, MD Primary Cardiologist: Sharyn Lull, MD Reason for Consultation: Cryptogenic stroke; recommendations regarding Implantable Loop Recorder  History of Present Illness [name] is a 77 year old woman with CAD, asthma, HTN, DM, OSA, rheumatoid arthritis and CKD who was admitted on 11/03/2013 with acute CVA. She has been monitored on telemetry which has demonstrated SR with occasional PVCs and one episode of atrial tachycardia. No cause has been identified. Inpatient stroke work-up is to be completed with a TEE. EP has been asked to evaluate for placement of an implantable loop recorder to monitor for atrial fibrillation.  Past Medical History Past Medical History  Diagnosis Date  . Complication of anesthesia     OCC HALLUCINATIONS  . S/P angioplasty     10/12  . Angina     INFREG  . Asthma     NO PROBLEMS RECENTLY  . Shortness of breath   . Sleep apnea     06  . GI bleed     12  4 UNITS OF BLOOD  . Blood transfusion     LATE 12 4 UNITS FOR GI BLEED  . Chronic kidney disease     STONES  . Hypertension   . Arthritis   . Diabetes mellitus     OFF JANUVIA AND HUMALOG SINCE NOV  . Anemia   . 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 Kidney stone   . CAD (coronary artery disease)   . Memory loss   . Diverticulosis     diverticulitis with rectal bleeding 2012  . Rheumatoid arthritis     Past Surgical History Past Surgical History  Procedure Laterality Date  . Coronary angioplasty    . Abdominal hysterectomy    . Hip arthroplasty      BIL  . Knee arthroplasty      LFT  . Eye surgery      CAT IOL LFT  . Decompressive lumbar laminectomy level 1  01/06/12    Allergies/Intolerances Allergies  Allergen Reactions  . Codeine Nausea Only and Other (See Comments)    REACTION: GI  upset    Inpatient Medications . antiseptic oral rinse  15 mL Mouth Rinse BID  . aspirin EC  325 mg Oral Daily  . brimonidine  1 drop Both Eyes TID  . enoxaparin (LOVENOX) injection  40 mg Subcutaneous Q24H  . insulin aspart  0-9 Units Subcutaneous TID WC  . metoprolol succinate  25 mg Oral Daily  . simvastatin  80 mg Oral QHS   . sodium chloride 100 mL/hr at 11/05/13 1604  . sodium chloride      Social History History   Social History  . Marital Status: Divorced    Spouse Name: N/A    Number of Children: N/A  . Years of Education: N/A   Occupational History  . Not on file.   Social History Main Topics  . Smoking status: Never Smoker   . Smokeless tobacco: Not on file  . Alcohol Use: No  . Drug Use: No  . Sexual Activity: Yes    Birth Control/ Protection: Post-menopausal   Other Topics Concern  . Not on file   Social History Narrative   Lives in a house no stairs other than front porch.  Lives alone.  She uses a cane occasionally.  Wears glasses.       Review of Systems General: No chills, fever, night sweats or weight changes  Cardiovascular:  No chest pain, dyspnea on exertion, edema, orthopnea, palpitations, paroxysmal nocturnal dyspnea Dermatological: No rash, lesions or masses Respiratory: No cough, dyspnea Urologic: No hematuria, dysuria Abdominal: No nausea, vomiting, diarrhea, bright red blood per rectum, melena, or hematemesis Neurologic: No visual changes, weakness, changes in mental status All other systems reviewed and are otherwise negative except as noted above.  Physical Exam Blood pressure 163/73, pulse 63, temperature 97.9 F (36.6 C), temperature source Oral, resp. rate 18, height 5\' 1"  (1.549 m), weight 148 lb 12.8 oz (67.495 kg), SpO2 99.00%.  General: Well developed, well appearing 77 y.o. female in no acute distress. HEENT: Normocephalic, atraumatic. EOMs intact. Sclera nonicteric. Oropharynx clear.  Neck: Supple. No JVD. Lungs:  Respirations regular and unlabored, CTA bilaterally. No wheezes, rales or rhonchi. Heart: RRR. S1, S2 present. No murmurs, rub, S3 or S4. Abdomen: Soft, non-tender, non-distended. BS present x 4 quadrants. No hepatosplenomegaly.  Extremities: No clubbing, cyanosis or edema. DP/PT/Radials 2+ and equal bilaterally. Psych: Normal affect. Neuro: Alert and oriented X 3. Moves all extremities spontaneously. Speech slightly delayed. Musculoskeletal: No kyphosis. Skin: Intact. Warm and dry. No rashes or petechiae in exposed areas.   Labs Lab Results  Component Value Date   WBC 7.8 11/04/2013   HGB 12.0 11/04/2013   HCT 35.5* 11/04/2013   MCV 84.7 11/04/2013   PLT 207 11/04/2013    Recent Labs Lab 11/03/13 1310  11/05/13 0458  NA 139  < > 142  K 3.7  < > 3.5  CL 100  < > 109  CO2 25  < > 21  BUN 14  < > 16  CREATININE 1.34*  < > 1.09  CALCIUM 9.8  < > 8.9  PROT 8.1  --   --   BILITOT 1.0  --   --   ALKPHOS 118*  --   --   ALT 13  --   --   AST 20  --   --   GLUCOSE 133*  < > 92  < > = values in this interval not displayed. No results found for this basename: INR,  in the last 72 hours  Radiology/Studies Ct Head (brain) Wo Contrast 11/03/2013     IMPRESSION: Mild motion degradation.  No intracranial hemorrhage.  Small vessel disease type changes without CT evidence of large acute infarct.  Small acute infarct of the left thalamus not excluded.  Mild atrophy without hydrocephalus.   Electronically Signed   By: Bridgett Larsson M.D.   On: 11/03/2013 15:35   Mr Brain Wo Contrast 11/03/2013   IMPRESSION: Multiple acute infarctions scattered along the surface of the brain in the left hemisphere from front to back, watershed distribution. No swelling, hemorrhage or mass effect.  MR angiography does not show any major vessel occlusion or correctable proximal stenosis.    Electronically Signed   By: Paulina Fusi M.D.   On: 11/03/2013 18:30   Dg Chest Portable 1 View 11/03/2013     IMPRESSION: Right base subsegmental atelectasis.    Electronically Signed   By: Charlett Nose M.D.   On: 11/03/2013 17:26   Cardiac catheterization FINDINGS:  LV showed good LV systolic function. EF of 55-60%. Left  main was patent. LAD has 15-20% mid stenosis. Diagonal 1 is very small  which is patent. Diagonal 2 has 60-65% smooth ostial and then 90-95%  mid  stenosis with haziness with bifurcation with smaller superior  branch. Diagonal 2 is very very small. Left circumflex has 10-15%  proximal stenosis. OM-1 is moderate-sized which has minimal plaquing.  OM-2 is small which is patent. OM-3 is very small. RCA is patent. PDA  is patent. PLV branches are very small which are patent.  INTERVENTIONAL PROCEDURE:  Successful PTCA to mid diagonal 2 was done  using 2.5 x 12 mm long MINI TREK balloon going up to 8-10 atmospheric  pressure. Multiple inflations were done. Lesion was dilated from 90-  95% to less than 20% residual with excellent TIMI grade 3 distal flow  without evidence of dissection or distal embolization. The patient  received weight-based Angiomax and 180 mg of Brilinta prior to the  procedure. The patient tolerated the procedure well. There were no  complications. The patient was transferred to recovery room in stable  condition.   Echocardiogram  Study Conclusions - Left ventricle: The cavity size was normal. There was severe concentric hypertrophy. Systolic function was normal. The estimated ejection fraction was in the range of 60% to 65%. Doppler parameters are consistent with abnormal left ventricular relaxation (grade 1 diastolic dysfunction). The E/e' ratio is >10, suggesting elevated LV filling pressure. - Mitral valve: Calcified annulus. Mildly thickened leaflets . Trivial regurgitation. - Left atrium: The atrium was at the upper limits of normal in size. - Right ventricle: RV systolic pressure: 33mm Hg (S, est). - Pulmonary arteries: PA peak pressure: 33mm  Hg (S). - Inferior vena cava: The vessel was normal in size; the respirophasic diameter changes were in the normal range (= 50%); findings are consistent with normal central venous pressure. - Pericardium, extracardiac: There was no pericardial effusion.  12-lead ECG on admission - SR with RBBB; similar to previous ECGs Telemetry shows SR with one brief episode of atrial tachycardia on 11/04/2013  Assessment and Plan 1. Cryptogenic stroke  If the TEE is negative, we recommend loop recorder insertion to monitor for AF. The indication for loop recorder insertion / monitoring for AF in setting of cryptogenic stroke was discussed with the patient. The loop recorder insertion procedure was reviewed in detail including risks and benefits. These risks include but are not limited to bleeding and infection. The patient expressed verbal understanding and agrees to proceed. The patient was also counseled regarding wound care and device follow-up.  Signed, EDMISTEN, BROOKE 11/06/2013, 8:03 AM    I have seen, examined the patient, and reviewed the above assessment and plan.  Changes to above are made where necessary.  Unable to pass TEE problem.  I would agree that at this point implantable loop recorder implant is appropriate for this patient to evaluate for afib as a cause for her cryptogenic stroke.  She has not had any afib on telemetry but did have nonsustained atrial tachycardia.  Risks and benefits of the procedure were discussed with the patient who wishes to proceed.  Co Sign: Hillis Range, MD 11/06/2013 12:22 PM

## 2013-11-06 NOTE — CV Procedure (Signed)
TEE unsuccessful.  Indication: CVA  Time out performed.  Versed: 5mg , Fentanyl  After multiple attempts at careful esophageal intubation, discontinued procedure after unsuccessful.  Discussed with patient.

## 2013-11-06 NOTE — CV Procedure (Signed)
SURGEON:  Hillis Range, MD     PREPROCEDURE DIAGNOSIS:  Cryptogenic Stroke    POSTPROCEDURE DIAGNOSIS:  Cryptogenic Stroke     PROCEDURES:   1. Implantable loop recorder implantation    INTRODUCTION:  [name] is a 77 y.o. female with a history of unexplained stroke who presents today for implantable loop implantation.  The patient has had a cryptogenic stroke.  Despite an extensive workup by neurology, no reversible causes have been identified.  she has worn telemetry during which she did not have arrhythmias.  There is significant concern for possible atrial fibrillation as the cause for the patients stroke.  The patient therefore presents today for implantable loop implantation.     DESCRIPTION OF PROCEDURE:  Informed written consent was obtained, and the patient was brought to the electrophysiology lab in a fasting state.  The patient required no sedation for the procedure today.  Mapping over the patient's chest was performed by the EP lab staff to identify the area where electrograms were most prominent for ILR recording.  This area was found to be the left parasternal region over the 3rd-4th intercostal space. The patients left chest was therefore prepped and draped in the usual sterile fashion by the EP lab staff. The skin overlying the left parasternal region was infiltrated with lidocaine for local analgesia.  A 0.5-cm incision was made over the left parasternal region over the 3rd intercostal space.  A subcutaneous ILR pocket was fashioned using a combination of sharp and blunt dissection.  A Medtronic Reveal [place] model [id number] implantable loop recorder was then placed into the pocket  R waves were very prominent and measured 0.42mV.  Steri- Strips and a sterile dressing were then applied.  There were no early apparent complications.     CONCLUSIONS:   1. Successful implantation of a Medtronic Reveal LINQ implantable loop recorder for cryptogenic stroke  2. No early  apparent complications.

## 2013-11-06 NOTE — Progress Notes (Signed)
Spoke with patient's son Len at bedside who indicates they have chosen [facility] for SNF rehab- patient has been there in the past- bed confirmed for her there for ?tomorrow- pending patient is stable for transfer- Message left for [facility] rep to get SNF auth-  Reece Levy, MSW, [employer] [phone]

## 2013-11-06 NOTE — Interval H&P Note (Signed)
History and Physical Interval Note:  11/06/2013 9:28 AM  [name]  has presented today for surgery, with the diagnosis of STROKE  The various methods of treatment have been discussed with the patient and family. After consideration of risks (including bleeding, esophageal damage), benefits and other options for treatment, the patient has consented to  Procedure(s): TRANSESOPHAGEAL ECHOCARDIOGRAM (TEE) (N/A) as a surgical intervention .  The patient's history has been reviewed, patient examined, no change in status, stable for surgery.  I have reviewed the patient's chart and labs.  Questions were answered to the patient's satisfaction.     SKAINS, MARK

## 2013-11-06 NOTE — ED Provider Notes (Signed)
Medical screening examination/treatment/procedure(s) were performed by non-physician practitioner and as supervising physician I was immediately available for consultation/collaboration.  EKG Interpretation     Ventricular Rate:  95 PR Interval:  170 QRS Duration: 112 QT Interval:  374 QTC Calculation: 469 R Axis:   -81 Text Interpretation:  Normal sinus rhythm Possible Left atrial enlargement Left axis deviation Right bundle branch block Abnormal ECG No significant change since last tracing             Harrold Donath R. Rubin Payor, MD 11/06/13 [phone]

## 2013-11-06 NOTE — Progress Notes (Signed)
Stroke Team Progress Note  HISTORY [name] is an 77 y.o. female who was last spoken to on Saturday night 11/01/2013 . Patients family members, as well as friend, attempted to get with her on early Sunday But were unable to. After her family members arrived from being out of town on Sunday evening they noted she was unable to follow commands and having significant problems getting her words out and she was off balance. Patient was brought to the ED 11/03/2013 showing difficulty follow commands verbally and visually, able to get about 2 words out fluently then gets frustrated due to inability to get her thoughts out or having word substitution. Patient was not a TPA candidate secondary to delay in arrival, unknown time of onset. She was admitted for further evaluation and treatment.  SUBJECTIVE Son at bedside. Card unable to pass scope for TEE. Pt just back to her room.  OBJECTIVE Most recent Vital Signs: Filed Vitals:   11/06/13 0950 11/06/13 1007 11/06/13 1010 11/06/13 1013  BP: 196/99 173/86 167/71 167/71  Pulse: 82 68 63 67  Temp:      TempSrc:      Resp: 15 21 22 21   Height:      Weight:      SpO2: 100% 100% 100% 97%   CBG (last 3)   Recent Labs  11/05/13 1651 11/05/13 2131 11/06/13 0628  GLUCAP 117* 117* 95   IV Fluid Intake:   . sodium chloride 100 mL/hr at 11/05/13 1604  . sodium chloride 20 mL/hr at 11/06/13 0908   MEDICATIONS  . antiseptic oral rinse  15 mL Mouth Rinse BID  . aspirin EC  325 mg Oral Daily  . brimonidine  1 drop Both Eyes TID  . enoxaparin (LOVENOX) injection  40 mg Subcutaneous Q24H  . insulin aspart  0-9 Units Subcutaneous TID WC  . metoprolol succinate  25 mg Oral Daily  . simvastatin  80 mg Oral QHS   PRN:  albuterol, nitroGLYCERIN, senna-docusate  Diet:  NPO  Activity:   DVT Prophylaxis:  SCDs, Lovenox 40 mg sq daily   CLINICALLY SIGNIFICANT STUDIES Basic Metabolic Panel:   Recent Labs Lab 11/04/13 0545 11/05/13 0458  NA 142  142  K 3.5 3.5  CL 105 109  CO2 23 21  GLUCOSE 95 92  BUN 19 16  CREATININE 1.41* 1.09  CALCIUM 9.2 8.9   Liver Function Tests:   Recent Labs Lab 11/03/13 1310  AST 20  ALT 13  ALKPHOS 118*  BILITOT 1.0  PROT 8.1  ALBUMIN 4.2   CBC:   Recent Labs Lab 11/03/13 2320 11/04/13 0545  WBC 8.6 7.8  HGB 12.4 12.0  HCT 36.3 35.5*  MCV 84.0 84.7  PLT 202 207   Coagulation: No results found for this basename: LABPROT, INR,  in the last 168 hours Cardiac Enzymes:   Recent Labs Lab 11/03/13 1540  TROPONINI <0.30   Urinalysis:   Recent Labs Lab 11/03/13 1351  COLORURINE YELLOW  LABSPEC 1.013  PHURINE 6.0  GLUCOSEU NEGATIVE  HGBUR TRACE*  BILIRUBINUR NEGATIVE  KETONESUR NEGATIVE  PROTEINUR NEGATIVE  UROBILINOGEN 1.0  NITRITE NEGATIVE  LEUKOCYTESUR TRACE*   Lipid Panel    Component Value Date/Time   CHOL 212* 11/04/2013 0545   TRIG 101 11/04/2013 0545   HDL 51 11/04/2013 0545   CHOLHDL 4.2 11/04/2013 0545   VLDL 20 11/04/2013 0545   LDLCALC 141* 11/04/2013 0545   HgbA1C  Lab Results  Component Value Date  HGBA1C 6.5* 11/04/2013    Urine Drug Screen:   No results found for this basename: labopia,  cocainscrnur,  labbenz,  amphetmu,  thcu,  labbarb    Alcohol Level: No results found for this basename: ETH,  in the last 168 hours     CT of the brain  11/03/2013   Mild motion degradation.  No intracranial hemorrhage.  Small vessel disease type changes without CT evidence of large acute infarct.  Small acute infarct of the left thalamus not excluded.  Mild atrophy without hydrocephalus.    MRI of the brain  11/03/2013    Multiple acute infarctions scattered along the surface of the brain in the left hemisphere from front to back, watershed distribution. No swelling, hemorrhage or mass effect.   MRA of the brain  11/03/2013   MR angiography does not show any major vessel occlusion or correctable proximal stenosis.    2D Echocardiogram  EF 60-65% with  no source of embolus.   Carotid Doppler  No evidence of hemodynamically significant internal carotid artery stenosis. Vertebral artery flow is antegrade.   CXR  11/03/2013   Right base subsegmental atelectasis.   EKG  normal sinus rhythm.   Therapy Recommendations CIR  Physical Exam   Pleasant elderly african american lady not in distress.Awake alert. Afebrile. Head is nontraumatic. Neck is supple without bruit. Hearing is normal. Cardiac exam no murmur or gallop. Lungs are clear to auscultation. Distal pulses are well felt. Neurological Exam : Awake alert and global aphasia. Speaks single words and able to name herself and son. Not able to speak sentences. Follows simple midline commands and occasional one-step commands only. Not able to name or repeat. Extraocular movements are full range without nystagmus. Blinks to threat bilaterally. No facial weakness. Tongue is midline. Motor system exam reveals no upper or lower extremity drift. Symmetric and equal strength in all 4 extremities. Not cooperative for detailed final to skin testing. Withdraws to painful stimuli in all 4 extremities. Gait was not tested.  ASSESSMENT Ms. [name] is a 77 y.o. female presenting with expressive aphasia.  Imaging confirms patchy left MCA infarcts. Infarcts felt to be embolic secondary to unknown etiology.  On aspirin 81 mg orally every day prior to admission. Now on aspirin 325 mg orally every day for secondary stroke prevention. Patient with resultant expressive greater than receptive aphasia, able to follow 1 step commands only, receptive aphasia, there has been some improvement. Work up underway.  Hypertension  Hyperlipidemia, LDL 141, on zocor 40 PTA, now on zocor 80, goal LDL < 100 (< 70 for diabetics) Diabetes, HgbA1c 6.5, goal < 7.0  Acute kidney injury, resolved after rehydration CAD -   Hospital day # 3  TREATMENT/PLAN  Change to clopidogrel 75 mg orally every day for secondary stroke  prevention for now. If TEE unrevealing, will change to plavix 75 mg at time of discharge. TEE to look for embolic source. Unable to pass probe.  [place] cardiologist will go ahead and place implantable loop recorder to evaluate for atrial fibrillation as etiology of stroke. This has been explained to patient/family by Dr. Pearlean Brownie and they are agreeable. I notified Trish with [facility].  SNF determined best option at time of discharge  Medically ready for discharge from neuro standpoint once loop placed  Follow up Dr. Pearlean Brownie in 2 months.  Annie Main, MSN, RN, ANVP-BC, ANP-BC, GNP-BC [facility] Pager: [phone] 11/06/2013 10:35 AM  I have personally obtained a history, examined the  patient, evaluated imaging results, and formulated the assessment and plan of care. I agree with the above.  Delia Heady, MD

## 2013-11-06 NOTE — Progress Notes (Signed)
[facility] PROGRESS NOTE  LUMI WINSLETT WUJ:[id number] DOB: 01-22-1936 DOA: 11/03/2013 PCP: Willey Blade, MD Interim history  This is a 77 year old female history of hypertension, hyperlipidemia, diabetes, coronary disease that presented with a fascia and mental status changes. It seems the patient did not go to church on Sunday so members went to check on her and found her aphasic with the unsteady gait as well as inability to follow commands. MRI did show acute infarction left hemisphere. Neurology has been consulted patient is currently on aspirin therapy.Currently awaiting placement after loop recorder placed.  Assessment/Plan:  Acute CVA, with aphasia, acute encephalopathy  -MRI: Multiple foci of acute infarction within the left hemisphere primarily in watershed distribution affecting this surface of the brain in the subcortical white matter from front to back  -Neurology consulted  -Continue Aspirin therapy, with plans to switch to Plavix on discharge -PT and OT consulted and recommended CIR vs SNF  Acute Kidney Injury  - Most likely due to dehydration - Creatinine within normal limits after IVF rehydration.  Diabetes Mellitus, type 2  -Hb A1C-7.0 (03/2013)  -Will hold metformin due to Cr  Continue ISS and CBG   CAD  -Currently chest pain free  -Continue metoprolol, ASA, simvastatin   HTN  -Will allow for permissive HTN  -Will continue metoprolol, however hold micardis  Code Status: full Family Communication: Discussed with patient at bedside Disposition Plan: Rehab on discharge CIR versus SNF   Consultants:  Neurology  Procedures:  None  Antibiotics:  None  HPI/Subjective: Cardiology was unable to obtained TEE  Objective: Filed Vitals:   11/06/13 1830  BP: 157/77  Pulse: 67  Temp: 97.6 F (36.4 C)  Resp: 20    Intake/Output Summary (Last 24 hours) at 11/06/13 1910 Last data filed at 11/06/13 1700  Gross per 24 hour  Intake   1080 ml   Output      0 ml  Net   1080 ml   Filed Weights   11/03/13 1307 11/03/13 2017  Weight: 67.161 kg (148 lb 1 oz) 67.495 kg (148 lb 12.8 oz)    Exam:   General:  NAD, alert and awake  Cardiovascular: RRR, no MRG  Respiratory: CTA BL no wheezes  Abdomen: soft, NT, ND  Musculoskeletal: no cyanosis   Data Reviewed: Basic Metabolic Panel:  Recent Labs Lab 11/03/13 1310 11/03/13 2320 11/04/13 0545 11/05/13 0458  NA 139  --  142 142  K 3.7  --  3.5 3.5  CL 100  --  105 109  CO2 25  --  23 21  GLUCOSE 133*  --  95 92  BUN 14  --  19 16  CREATININE 1.34* 1.30* 1.41* 1.09  CALCIUM 9.8  --  9.2 8.9   Liver Function Tests:  Recent Labs Lab 11/03/13 1310  AST 20  ALT 13  ALKPHOS 118*  BILITOT 1.0  PROT 8.1  ALBUMIN 4.2   No results found for this basename: LIPASE, AMYLASE,  in the last 168 hours No results found for this basename: AMMONIA,  in the last 168 hours CBC:  Recent Labs Lab 11/03/13 1310 11/03/13 2320 11/04/13 0545  WBC 8.5 8.6 7.8  HGB 13.3 12.4 12.0  HCT 37.9 36.3 35.5*  MCV 83.7 84.0 84.7  PLT 237 202 207   Cardiac Enzymes:  Recent Labs Lab 11/03/13 1540  TROPONINI <0.30   BNP (last 3 results) No results found for this basename: PROBNP,  in the last 8760 hours CBG:  Recent Labs Lab 11/05/13 1651 11/05/13 2131 11/06/13 0628 11/06/13 1137 11/06/13 1628  GLUCAP 117* 117* 95 90 93    No results found for this or any previous visit (from the past 240 hour(s)).   Studies: No results found.  Scheduled Meds: . antiseptic oral rinse  15 mL Mouth Rinse BID  . brimonidine  1 drop Both Eyes TID  . clopidogrel  75 mg Oral Q breakfast  . enoxaparin (LOVENOX) injection  40 mg Subcutaneous Q24H  . insulin aspart  0-9 Units Subcutaneous TID WC  . metoprolol succinate  25 mg Oral Daily  . simvastatin  80 mg Oral QHS   Continuous Infusions: . sodium chloride 100 mL/hr at 11/05/13 1604  . sodium chloride 20 mL/hr at 11/06/13 0908     Principal Problem:   Acute CVA (cerebrovascular accident) Active Problems:   DIABETES MELLITUS, TYPE II   HYPERLIPIDEMIA   HYPERTENSION    Time spent: > 35 minutes    [name]  [facility] Pager [phone] If 7PM-7AM, please contact night-coverage at [email or web address], password [facility] 11/06/2013, 7:10 PM  LOS: 3 days

## 2013-11-06 NOTE — OR Nursing (Signed)
Dr. Anne Fu unable to pass ultrasound probe.  TEE aborted

## 2013-11-07 DIAGNOSIS — I2510 Atherosclerotic heart disease of native coronary artery without angina pectoris: Secondary | ICD-10-CM | POA: Diagnosis present

## 2013-11-07 MED ORDER — SIMVASTATIN 80 MG PO TABS: 80.0000 mg | ORAL_TABLET | Freq: Every day | ORAL | Status: DC

## 2013-11-07 NOTE — Discharge Summary (Signed)
Physician Discharge Summary  [name]:[id number] DOB: [birth year] DOA: 11/03/2013  PCP: Willey Blade, MD  Admit date: 11/03/2013 Discharge date: 11/07/2013  Time spent: 40 minutes  Recommendations for Outpatient Follow-up:  Acute CVA, with aphasia, acute encephalopathy  -MRI: Multiple foci of acute infarction within the left hemisphere primarily in watershed distribution affecting this surface of the brain in the subcortical white matter from front to back  -Neurology consulted  -Continue Plavix   -PT/OT recommends SNF   Acute Kidney Injury  - Most likely due to dehydration  - Creatinine within normal limits after IVF rehydration.   Diabetes Mellitus, type 2  -Hb A1C-7.0 (03/2013)  -Stacey restart metformin however the patient must monitor creatinine closely      CAD  -Currently chest pain free  -Continue metoprolol, ASA, simvastatin   HTN  -Will allow for permissive HTN  -Will continue metoprolol, however hold micardis     Discharge Diagnoses:  Principal Problem:   Acute CVA (cerebrovascular accident) Active Problems:   DIABETES MELLITUS, TYPE II   HYPERLIPIDEMIA   HYPERTENSION   Discharge Condition: Stable  Diet recommendation: ADA  Filed Weights   11/03/13 1307 11/03/13 2017  Weight: 67.161 kg (148 lb 1 oz) 67.495 kg (148 lb 12.8 oz)    History of present illness:  [name] is a 77 y.o. BF  PMHx HTN, HLD DM Type 2, CAD, presented with aphasia, and change in mental status; patient's family was ot of town, arrived on Sunday and Found her unable to follow commands, aphasiac, unstable gait; no chest pain, no SOB, no fever, chills, no nausea, vomiting or diarrhea'. TODAY states all neurologic deficits resolved, ready for discharge     Procedures:  MRI/MRA 11/03/2013 IMPRESSION:  Multiple acute infarctions scattered along the surface of the brain  in the left hemisphere from front to back, watershed distribution.  No swelling, hemorrhage or mass  effect.  MR angiography does not show any major vessel occlusion or  correctable proximal stenosis.    Consultations:  Neurology    Discharge Exam: Filed Vitals:   11/07/13 0549 11/07/13 0900 11/07/13 1400 11/07/13 1427  BP: 155/66 170/84 142/69   Pulse: 60 65 65   Temp: 98.1 F (36.7 C) 98.2 F (36.8 C) 98.2 F (36.8 C)   TempSrc: Oral Oral Oral   Resp: 18  18   Height:      Weight:      SpO2: 100% 98% 100% 98%    General: A./O. x4, NAD Cardiovascular: Regular rhythm and rate, negative murmurs rubs gallops Respiratory: Clear to auscultation bilateral Neurologic; pupils equal round reactive to light and accommodation, cranial nerves II through XII intact, extremity strength 5/5, sensation intact throughout, ambulation normal  Discharge Instructions   Future Appointments Provider Department Dept Phone   11/19/2013 4:00 PM [facility] [place] Office [phone]       Medication List    ASK your doctor about these medications       albuterol 108 (90 BASE) MCG/ACT inhaler  Commonly known as:  PROVENTIL HFA;VENTOLIN HFA  Inhale 2 puffs into the lungs every 6 (six) hours as needed. Shortness of breath     aspirin EC 81 MG tablet  Take 81 mg by mouth daily.     bimatoprost 0.03 % ophthalmic solution  Commonly known as:  LUMIGAN  Place 1 drop into both eyes at bedtime.     brimonidine 0.15 % ophthalmic solution  Commonly known as:  ALPHAGAN  Place 1 drop into both eyes 3 (three) times daily.     fentaNYL 25 MCG/HR patch  Commonly known as:  DURAGESIC - dosed mcg/hr  Place 25 mcg onto the skin every 3 (three) days.     metFORMIN 500 MG tablet  Commonly known as:  GLUCOPHAGE  Take 500 mg by mouth 2 (two) times daily with a meal.     methotrexate 25 MG/ML Soln  Inject 25 mg as directed every 7 (seven) days. Takes on Sunday     metoprolol succinate 25 MG 24 hr tablet  Commonly known as:  TOPROL-XL  Take 25 mg by mouth daily.      nitroGLYCERIN 0.4 MG SL tablet  Commonly known as:  NITROSTAT  Place 0.4 mg under the tongue every 5 (five) minutes as needed for chest pain. x3 doses as needed for chest pain     simvastatin 40 MG tablet  Commonly known as:  ZOCOR  Take 40 mg by mouth at bedtime.     sitaGLIPtin 100 MG tablet  Commonly known as:  JANUVIA  Take 100 mg by mouth daily.     telmisartan 80 MG tablet  Commonly known as:  MICARDIS  Take 80 mg by mouth daily.     telmisartan-hydrochlorothiazide 80-12.5 MG per tablet  Commonly known as:  MICARDIS HCT  Take 1 tablet by mouth daily.       Allergies  Allergen Reactions  . Codeine Nausea Only and Other (See Comments)    REACTION: GI upset       Follow-up Information   Follow up with Gates Rigg, MD. Schedule an appointment as soon as possible for a visit in 2 months. (stroke service)    Specialties:  Neurology, Radiology   Contact information:   [address] [place] [address] [phone]       Follow up with [facility] Office On 11/19/2013. (At 4:00 PM for wound check)    Specialty:  Cardiology   Contact information:   [address] [place] [address] [phone]       The results of significant diagnostics from this hospitalization (including imaging, microbiology, ancillary and laboratory) are listed below for reference.    Significant Diagnostic Studies: Ct Head (brain) Wo Contrast  11/03/2013   CLINICAL DATA:  Confusion. Possible stroke.  EXAM: CT HEAD WITHOUT CONTRAST  TECHNIQUE: Contiguous axial images were obtained from the base of the skull through the vertex without intravenous contrast.  COMPARISON:  No comparison head CT. Prior sinus CT 10/12/2008.  FINDINGS: Mild motion degradation.  No intracranial hemorrhage.  Small vessel disease type changes without CT evidence of large acute infarct.  Small acute infarct of the left thalamus not excluded.  Mild atrophy without  hydrocephalus.  No intracranial mass lesion noted on this unenhanced exam.  Hyperostosis frontalis interna.  Mild mucosal thickening medial aspect left maxillary sinus.  IMPRESSION: Mild motion degradation.  No intracranial hemorrhage.  Small vessel disease type changes without CT evidence of large acute infarct.  Small acute infarct of the left thalamus not excluded.  Mild atrophy without hydrocephalus.   Electronically Signed   By: Bridgett Larsson M.D.   On: 11/03/2013 15:35   Mr Maxine Glenn Head Wo Contrast  11/03/2013   CLINICAL DATA:  Expressive a aphasia. Confusion.  EXAM: MRI HEAD WITHOUT CONTRAST  MRA HEAD WITHOUT CONTRAST  TECHNIQUE: Multiplanar, multiecho pulse sequences of the brain and surrounding structures were obtained without  intravenous contrast. Angiographic images of the head were obtained using MRA technique without contrast.  COMPARISON:  Head CT same day  FINDINGS: MRI HEAD FINDINGS  There are extensive chronic small vessel changes throughout the pons. No focal cerebellar insult.  There are multiple foci of acute infarction within the left hemisphere primarily in watershed distribution affecting this surface of the brain in the subcortical white matter from front to back. No large confluent area of infarction. No discernible hemorrhage. No mass lesion, hydrocephalus or extra-axial collection.  MRA HEAD FINDINGS  Both internal carotid arteries are widely patent into the brain. No siphon stenosis. The anterior and middle cerebral vessels are patent bilaterally. 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  Both vertebral arteries are widely patent with the right being dominant. No basilar stenosis. Posterior circulation branch vessels are normal. There is a large posterior communicating artery on the right.  IMPRESSION: Multiple acute infarctions scattered along the surface of the brain in the left hemisphere from front to back, watershed distribution. No swelling, hemorrhage or mass  effect.  MR angiography does not show any major vessel occlusion or correctable proximal stenosis.   Electronically Signed   By: Paulina Fusi M.D.   On: 11/03/2013 18:30   Mr Brain Wo Contrast  11/03/2013   CLINICAL DATA:  Expressive a aphasia. Confusion.  EXAM: MRI HEAD WITHOUT CONTRAST  MRA HEAD WITHOUT CONTRAST  TECHNIQUE: Multiplanar, multiecho pulse sequences of the brain and surrounding structures were obtained without intravenous contrast. Angiographic images of the head were obtained using MRA technique without contrast.  COMPARISON:  Head CT same day  FINDINGS: MRI HEAD FINDINGS  There are extensive chronic small vessel changes throughout the pons. No focal cerebellar insult.  There are multiple foci of acute infarction within the left hemisphere primarily in watershed distribution affecting this surface of the brain in the subcortical white matter from front to back. No large confluent area of infarction. No discernible hemorrhage. No mass lesion, hydrocephalus or extra-axial collection.  MRA HEAD FINDINGS  Both internal carotid arteries are widely patent into the brain. No siphon stenosis. The anterior and middle cerebral vessels are patent bilaterally. 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  Both vertebral arteries are widely patent with the right being dominant. No basilar stenosis. Posterior circulation branch vessels are normal. There is a large posterior communicating artery on the right.  IMPRESSION: Multiple acute infarctions scattered along the surface of the brain in the left hemisphere from front to back, watershed distribution. No swelling, hemorrhage or mass effect.  MR angiography does not show any major vessel occlusion or correctable proximal stenosis.   Electronically Signed   By: Paulina Fusi M.D.   On: 11/03/2013 18:30   Dg Chest Portable 1 View  11/03/2013   CLINICAL DATA:  Altered mental status.  EXAM: PORTABLE CHEST - 1 VIEW  COMPARISON:  04/04/2013   FINDINGS: Linear subsegmental atelectasis in the right lung base. No focal opacity on the left. Heart is upper limits normal in size. No effusions or acute bony abnormality.  IMPRESSION: Right base subsegmental atelectasis.   Electronically Signed   By: Charlett Nose M.D.   On: 11/03/2013 17:26    Microbiology: No results found for this or any previous visit (from the past 240 hour(s)).   Labs: Basic Metabolic Panel:  Recent Labs Lab 11/03/13 1310 11/03/13 2320 11/04/13 0545 11/05/13 0458  NA 139  --  142 142  K 3.7  --  3.5 3.5  CL 100  --  105 109  CO2 25  --  23 21  GLUCOSE 133*  --  95 92  BUN 14  --  19 16  CREATININE 1.34* 1.30* 1.41* 1.09  CALCIUM 9.8  --  9.2 8.9   Liver Function Tests:  Recent Labs Lab 11/03/13 1310  AST 20  ALT 13  ALKPHOS 118*  BILITOT 1.0  PROT 8.1  ALBUMIN 4.2   No results found for this basename: LIPASE, AMYLASE,  in the last 168 hours No results found for this basename: AMMONIA,  in the last 168 hours CBC:  Recent Labs Lab 11/03/13 1310 11/03/13 2320 11/04/13 0545  WBC 8.5 8.6 7.8  HGB 13.3 12.4 12.0  HCT 37.9 36.3 35.5*  MCV 83.7 84.0 84.7  PLT 237 202 207   Cardiac Enzymes:  Recent Labs Lab 11/03/13 1540  TROPONINI <0.30   BNP: BNP (last 3 results) No results found for this basename: PROBNP,  in the last 8760 hours CBG:  Recent Labs Lab 11/06/13 1137 11/06/13 1628 11/06/13 2044 11/07/13 0704 11/07/13 1119  GLUCAP 90 93 118* 91 114*       Signed:  Carolyne Littles, MD [facility] [phone] pager

## 2013-11-07 NOTE — Social Work (Deleted)
Clinical Social Work Department BRIEF PSYCHOSOCIAL ASSESSMENT 11/07/2013  Patient:  HEAD,DORIS     Account Number:  [id number]     Admit date:  11/04/2013  Clinical Social Worker:  Robin Searing  Date/Time:  11/07/2013 04:02 PM  Referred by:  Physician  Date Referred:  11/07/2013 Referred for  SNF Placement   Other Referral:   Interview type:  Other - See comment Other interview type:   Met with patient and her husband at bedside    PSYCHOSOCIAL DATA Living Status:  FAMILY Admitted from facility:   Level of care:   Primary support name:  husband Primary support relationship to patient:  FAMILY Degree of support available:   good    CURRENT CONCERNS Current Concerns  Post-Acute Placement   Other Concerns:    SOCIAL WORK ASSESSMENT / PLAN Patient and husband reside in [place], [place]- discussed possible need for SNF stay prior to returning home and they are open to this if needed- patient works in the [employer]. at a SNF in [place] and this would be her preference if needed at d/c.  I have completed FL2 and faxed this along with PT and OT notes to them for SNF auth (patient has BCBS).  [facility] also considering patient. Patient and her husband are uncertain at this time what level she will be at as she recovers from her back surgeries- all options discussed and questions answered. They are appreciative of everyones care and assistance and voice pleasure in the continuity of care by all disciplkines.   Assessment/plan status:   Other assessment/ plan:   Information/referral to community resources:    PATIENT'S/FAMILY'S RESPONSE TO PLAN OF CARE:   Reece Levy, MSW, [place] [phone]

## 2013-11-07 NOTE — Social Work (Signed)
Clinical Social Work Department CLINICAL SOCIAL WORK PLACEMENT NOTE 11/07/2013  Patient:  CEDAR, ROSEMAN  Account Number:  [id number] Admit date:  11/03/2013  Clinical Social Worker:  Robin Searing  Date/time:  11/04/2013 11:59 AM  Clinical Social Work is seeking post-discharge placement for this patient at the following level of care:   SKILLED NURSING   (*CSW will update this form in Epic as items are completed)   11/04/2013  Patient/family provided with [facility] Department of Clinical Social Work's list of facilities offering this level of care within the geographic area requested by the patient (or if unable, by the patient's family).  11/04/2013  Patient/family informed of their freedom to choose among providers that offer the needed level of care, that participate in Medicare, Medicaid or managed care program needed by the patient, have an available bed and are willing to accept the patient.  11/04/2013  Patient/family informed of [facility]' ownership interest in [facility], as well as of the fact that they are under no obligation to receive care at this facility.  PASARR submitted to EDS on 11/04/2013 PASARR number received from EDS on   FL2 transmitted to all facilities in geographic area requested by pt/family on  11/04/2013 FL2 transmitted to all facilities within larger geographic area on   Patient informed that his/her managed care company has contracts with or will negotiate with  certain facilities, including the following:     Patient/family informed of bed offers received:  11/05/2013 Patient chooses bed at [facility] Physician recommends and patient chooses bed at    Patient to be transferred to [facility] on  11/07/2013 Patient to be transferred to facility by [facility]  The following physician request were entered in Epic:   Additional Comments: Reece Levy, MSW, [place] [phone]

## 2013-11-07 NOTE — Progress Notes (Signed)
Physical Therapy Treatment Patient Details Name: [name] MRN: [id number] DOB: 08-05-1936 Today's Date: 11/07/2013 Time: 4098-1191 PT Time Calculation (min): 24 min  PT Assessment / Plan / Recommendation  History of Present Illness Pt admitted with altered mental status and difficulty speaking. 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   PT Comments   Pt progressing well with ambulation today.  Pt is more vocal compared to last tx and able to express thoughts.  Pt recalled previous tx and was able to vocalize thoughts that she had then that she was unable to express.  Pt able to perform balance activities with no LOB; pt is somewhat unsteady thus safest to walk with assistance.  Pt will benefit from SNF PT to increase functional independence.   Follow Up Recommendations  SNF     Does the patient have the potential to tolerate intense rehabilitation     Barriers to Discharge        Equipment Recommendations       Recommendations for Other Services    Frequency Min 4X/week   Progress towards PT Goals Progress towards PT goals: Progressing toward goals  Plan Current plan remains appropriate    Precautions / Restrictions Precautions Precautions: Fall Precaution Comments: pt with chair alarm Restrictions Weight Bearing Restrictions: No   Pertinent Vitals/Pain no apparent distress     Mobility  Bed Mobility Details for Bed Mobility Assistance: Pt received in recliner Transfers Transfers: Sit to Stand;Stand to Sit Sit to Stand: 5: Supervision;With armrests;From chair/3-in-1 Stand to Sit: 5: Supervision;To chair/3-in-1;With armrests Details for Transfer Assistance: Supervision for safety because pt somewhat unsteady Ambulation/Gait Ambulation/Gait Assistance: 5: Supervision;4: Min guard Ambulation Distance (Feet): 350 Feet Assistive device: None Ambulation/Gait Assistance Details: Pt reached for objects in hallway for balance; pt unsteady  however no LOB; pt unsafe to walk independently; Required 2 standing rest breaks and one seated rest break at 250' due to fatigue Gait Pattern: Step-through pattern;Decreased stride length;Narrow base of support Gait velocity: guarded/cautious Stairs: No    Exercises     PT Diagnosis:    PT Problem List:   PT Treatment Interventions:     PT Goals (current goals can now be found in the care plan section)    Visit Information  Last PT Received On: 11/07/13 Assistance Needed: +1 History of Present Illness: Pt admitted with altered mental status and difficulty speaking. 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    Subjective Data      Cognition  Cognition Arousal/Alertness: Awake/alert Behavior During Therapy: WFL for tasks assessed/performed Overall Cognitive Status: Within Functional Limits for tasks assessed Following Commands: Follows one step commands consistently Problem Solving: Slow processing General Comments: Pt following one step commands with increased time    Balance  Balance Balance Assessed: Yes Static Sitting Balance Static Sitting - Balance Support: Feet supported;No upper extremity supported Static Sitting - Level of Assistance: 6: Modified independent (Device/Increase time) Static Sitting - Comment/# of Minutes: x2 min no LOB, pt steady Dynamic Sitting Balance Dynamic Sitting - Balance Support: No upper extremity supported;Feet supported;During functional activity Dynamic Sitting - Level of Assistance: 5: Stand by assistance Dynamic Sitting - Comments: Pt able to reach across midline with BUE's outside of COG at different heights with no LOB Static Standing Balance Static Standing - Balance Support: During functional activity;No upper extremity supported Static Standing - Level of Assistance: 5: Stand by assistance Static Standing - Comment/# of  Minutes: Pt able to perform grooming tasks at sink x19min, no LOB Dynamic Standing  Balance Dynamic Standing - Level of Assistance: 5: Stand by assistance Dynamic Standing - Balance Activities: Reaching across midline;Reaching for objects;Forward lean/weight shifting Dynamic Standing - Comments: Pt able to reach across midline with BUE's outside of COG at different heights with no LOB; pt able to pick up weighted object from floor  End of Session PT - End of Session Equipment Utilized During Treatment: Gait belt Activity Tolerance: Patient tolerated treatment well Patient left: in chair;with nursing/sitter in room;with call bell/phone within reach;with chair alarm set Nurse Communication: Mobility status   GP     Ernestina Columbia, SPTA 11/07/2013, 11:53 AM

## 2013-11-07 NOTE — Progress Notes (Signed)
Seen and agreed 11/07/2013 [place] [facility] [facility] PTA [phone] pager [phone] office    

## 2013-11-07 NOTE — Progress Notes (Signed)
[facility] called to give report. CSW gave packet to family. Patient's son will be transporting patient to [facility] in his own vehicle. Dr. Joseph Art notified and okay with that plan.

## 2013-11-07 NOTE — Progress Notes (Signed)
Stroke Team Progress Note  HISTORY [name] is an 77 y.o. female who was last spoken to on Saturday night 11/01/2013 . Patients family members, as well as friend, attempted to get with her on early Sunday But were unable to. After her family members arrived from being out of town on Sunday evening they noted she was unable to follow commands and having significant problems getting her words out and she was off balance. Patient was brought to the ED 11/03/2013 showing difficulty follow commands verbally and visually, able to get about 2 words out fluently then gets frustrated due to inability to get her thoughts out or having word substitution. Patient was not a TPA candidate secondary to delay in arrival, unknown time of onset. She was admitted for further evaluation and treatment.  SUBJECTIVE Son at bedside.  OBJECTIVE Most recent Vital Signs: Filed Vitals:   11/06/13 1830 11/06/13 2200 11/07/13 0200 11/07/13 0549  BP: 157/77 139/68 163/86 155/66  Pulse: 67 58 65 60  Temp: 97.6 F (36.4 C) 97.8 F (36.6 C) 98 F (36.7 C) 98.1 F (36.7 C)  TempSrc: Oral Oral Oral Oral  Resp: 20 20 18 18   Height:      Weight:      SpO2: 100% 100% 99% 100%   CBG (last 3)   Recent Labs  11/06/13 1628 11/06/13 2044 11/07/13 0704  GLUCAP 93 118* 91   IV Fluid Intake:   . sodium chloride 100 mL/hr at 11/05/13 1604  . sodium chloride 20 mL/hr at 11/06/13 0908   MEDICATIONS  . antiseptic oral rinse  15 mL Mouth Rinse BID  . brimonidine  1 drop Both Eyes TID  . clopidogrel  75 mg Oral Q breakfast  . enoxaparin (LOVENOX) injection  40 mg Subcutaneous Q24H  . insulin aspart  0-9 Units Subcutaneous TID WC  . metoprolol succinate  25 mg Oral Daily  . simvastatin  80 mg Oral QHS   PRN:  albuterol, nitroGLYCERIN, senna-docusate  Diet:  Carb Control thin liquids Activity:   DVT Prophylaxis:  SCDs, Lovenox 40 mg sq daily   CLINICALLY SIGNIFICANT STUDIES Basic Metabolic Panel:   Recent  Labs Lab 11/04/13 0545 11/05/13 0458  NA 142 142  K 3.5 3.5  CL 105 109  CO2 23 21  GLUCOSE 95 92  BUN 19 16  CREATININE 1.41* 1.09  CALCIUM 9.2 8.9   Liver Function Tests:   Recent Labs Lab 11/03/13 1310  AST 20  ALT 13  ALKPHOS 118*  BILITOT 1.0  PROT 8.1  ALBUMIN 4.2   CBC:   Recent Labs Lab 11/03/13 2320 11/04/13 0545  WBC 8.6 7.8  HGB 12.4 12.0  HCT 36.3 35.5*  MCV 84.0 84.7  PLT 202 207   Coagulation: No results found for this basename: LABPROT, INR,  in the last 168 hours Cardiac Enzymes:   Recent Labs Lab 11/03/13 1540  TROPONINI <0.30   Urinalysis:   Recent Labs Lab 11/03/13 1351  COLORURINE YELLOW  LABSPEC 1.013  PHURINE 6.0  GLUCOSEU NEGATIVE  HGBUR TRACE*  BILIRUBINUR NEGATIVE  KETONESUR NEGATIVE  PROTEINUR NEGATIVE  UROBILINOGEN 1.0  NITRITE NEGATIVE  LEUKOCYTESUR TRACE*   Lipid Panel    Component Value Date/Time   CHOL 212* 11/04/2013 0545   TRIG 101 11/04/2013 0545   HDL 51 11/04/2013 0545   CHOLHDL 4.2 11/04/2013 0545   VLDL 20 11/04/2013 0545   LDLCALC 141* 11/04/2013 0545   HgbA1C  Lab Results  Component Value Date  HGBA1C 6.5* 11/04/2013    Urine Drug Screen:   No results found for this basename: labopia,  cocainscrnur,  labbenz,  amphetmu,  thcu,  labbarb    Alcohol Level: No results found for this basename: ETH,  in the last 168 hours    CT of the brain  11/03/2013   Mild motion degradation.  No intracranial hemorrhage.  Small vessel disease type changes without CT evidence of large acute infarct.  Small acute infarct of the left thalamus not excluded.  Mild atrophy without hydrocephalus.    MRI of the brain  11/03/2013    Multiple acute infarctions scattered along the surface of the brain in the left hemisphere from front to back, watershed distribution. No swelling, hemorrhage or mass effect.   MRA of the brain  11/03/2013   MR angiography does not show any major vessel occlusion or correctable proximal  stenosis.    2D Echocardiogram  EF 60-65% with no source of embolus.   Carotid Doppler  No evidence of hemodynamically significant internal carotid artery stenosis. Vertebral artery flow is antegrade.   CXR  11/03/2013   Right base subsegmental atelectasis.   EKG  normal sinus rhythm.   TEE unable to pass probe  Therapy Recommendations CIR  Physical Exam   Pleasant elderly african american lady not in distress.Awake alert. Afebrile. Head is nontraumatic. Neck is supple without bruit. Hearing is normal. Cardiac exam no murmur or gallop. Lungs are clear to auscultation. Distal pulses are well felt. Neurological Exam : Awake alert and   Speaks sentences now but has paraphasias and some word hesitancy and able to name herself and son.   Follows simple midline commands and   one-step commands only. Not able to name or repeat. Extraocular movements are full range without nystagmus. Blinks to threat bilaterally. No facial weakness. Tongue is midline. Motor system exam reveals no upper or lower extremity drift. Symmetric and equal strength in all 4 extremities. Not cooperative for detailed final to skin testing. Withdraws to painful stimuli in all 4 extremities. Gait was not tested.  ASSESSMENT [name] is a 77 y.o. female presenting with expressive aphasia.  Imaging confirms patchy left MCA infarcts. Infarcts felt to be embolic secondary to unknown etiology - unable to complete TEE, loop placed.  On aspirin 81 mg orally every day prior to admission. Now on clopidogrel 75 mg orally every day for secondary stroke prevention. Patient with resultant expressive greater than receptive aphasia, able to follow 1 step commands only, receptive aphasia, continues to have improvement. Work up completed.  Hypertension  Hyperlipidemia, LDL 141, on zocor 40 PTA, now on zocor 80, goal LDL < 100 (< 70 for diabetics) Diabetes, HgbA1c 6.5, goal < 7.0  Acute kidney injury, resolved after  rehydration CAD  Hospital day # 4  TREATMENT/PLAN  Continue  clopidogrel 75 mg orally every day for secondary stroke prevention.  Device clinic will f/u implantable loop recorder to evaluate for atrial fibrillation as etiology of stroke. Telemetry has been discontinued.  SNF determined best option at time of discharge. Bed offers have been made. May likely be able to go home after a short SNF stay.  Medically ready for discharge from neuro standpoint  Patient has a 10-15% risk of having another stroke over the next year, the highest risk is within 2 weeks of the most recent stroke/TIA (risk of having a stroke following a stroke or TIA is the same). Ongoing risk factor control by Primary Care Physician Stroke  Service will sign off. Please call should any needs arise. Follow up with Dr. Pearlean Brownie, Stroke Clinic, in 2 months.   Annie Main, MSN, RN, ANVP-BC, ANP-BC, [facility] Pager: [phone] 11/07/2013 9:28 AM  I have personally obtained a history, examined the patient, evaluated imaging results, and formulated the assessment and plan of care. I agree with the above. Delia Heady, MD

## 2013-11-07 NOTE — Progress Notes (Signed)
Occupational Therapy Treatment Patient Details Name: [name] MRN: [id number] DOB: 1936/03/09 Today's Date: 11/07/2013 Time: 7829-5621 OT Time Calculation (min): 23 min  OT Assessment / Plan / Recommendation  History of present illness Pt admitted with altered mental status and difficulty speaking. 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   OT comments  Pt is making steady progress.  Still needs overall supervision for safety secondary to slight balance difficulties and cognitive deficits.  Pt with difficulty problem solving today when attempting to locate her room number.  Needed mod questioning cues from therapist to accomplish this.    Follow Up Recommendations  SNF       Equipment Recommendations  None recommended by OT       Frequency Min 2X/week   Progress towards OT Goals Progress towards OT goals: Progressing toward goals  Plan Discharge plan needs to be updated    Precautions / Restrictions Precautions Precautions: Fall Precaution Comments: pt with chair alarm Restrictions Weight Bearing Restrictions: No   Pertinent Vitals/Pain Pt with no report of pain O2 sats 95% on room air after mobility    ADL  Toilet Transfer: Simulated;Supervision/safety Toilet Transfer Method: Other (comment) (ambulate without assistive device) Transfers/Ambulation Related to ADLs: Pt overall close supervision for mobility without use of assistive device. ADL Comments: Pt able to mobilize with close supervision.  tends to drift to the left during walking however.  On one occasion therapist was walking with her on the right side and she did not notice that he was there.  Tested peripheral fields grossly and pt was able to detect stimuli in the right field but demonstrated difficulty in the left.  Left visual field could also be impaired secondary to pt's history of glaucoma.  Pt was able to read small print using her glasses without difficulty.     Visit  Information  Last OT Received On: 11/07/13 Assistance Needed: +1 History of Present Illness: Pt admitted with altered mental status and difficulty speaking. 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          Cognition  Cognition Arousal/Alertness: Awake/alert Behavior During Therapy: WFL for tasks assessed/performed Overall Cognitive Status: Impaired/Different from baseline Area of Impairment: Memory;Problem solving;Awareness Current Attention Level: Sustained Memory: Decreased short-term memory Following Commands: Follows one step commands consistently Safety/Judgement: Decreased awareness of deficits Awareness: Intellectual Problem Solving: Requires verbal cues General Comments: Pt unable to remember her room number and how to locate it using the appropriate signs.  She was standing in front of her room and state the room number "4N14" but did not use the sign.  She instead started walking the other direction and the therapist had to cue her to use the signs to note that she was going the wrong direction.    Mobility  Bed Mobility Details for Bed Mobility Assistance: Pt received in recliner Transfers Transfers: Sit to Stand;Stand to Sit Sit to Stand: 5: Supervision;With armrests;From chair/3-in-1 Stand to Sit: 5: Supervision;To chair/3-in-1;With armrests Details for Transfer Assistance: Supervision for safety because pt somewhat unsteady       Balance Balance Balance Assessed: Yes Static Sitting Balance Static Sitting - Balance Support: Feet supported;No upper extremity supported Static Sitting - Level of Assistance: 6: Modified independent (Device/Increase time) Static Sitting - Comment/# of Minutes: x2 min no LOB, pt steady Dynamic Sitting Balance Dynamic Sitting - Balance Support: No upper extremity supported;Feet supported;During functional activity Dynamic Sitting - Level  of Assistance: 5: Stand by assistance Dynamic Sitting - Comments: Pt  able to reach across midline with BUE's outside of COG at different heights with no LOB Static Standing Balance Static Standing - Balance Support: No upper extremity supported Static Standing - Level of Assistance: 5: Stand by assistance Static Standing - Comment/# of Minutes: Pt able to perform grooming tasks at sink x78min, no LOB Dynamic Standing Balance Dynamic Standing - Level of Assistance: 5: Stand by assistance;1: +2 Total assist Dynamic Standing - Balance Activities: Reaching across midline;Reaching for objects;Forward lean/weight shifting Dynamic Standing - Comments: Pt able to reach across midline with BUE's outside of COG at different heights with no LOB; pt able to pick up weighted object from floor   End of Session OT - End of Session Equipment Utilized During Treatment: Gait belt Activity Tolerance: Patient tolerated treatment well Patient left: in chair;with call bell/phone within reach;with family/visitor present Nurse Communication: Mobility status     [name] OTR/L 11/07/2013, 2:37 PM

## 2013-11-19 ENCOUNTER — Ambulatory Visit (INDEPENDENT_AMBULATORY_CARE_PROVIDER_SITE_OTHER): Payer: Medicare PPO | Admitting: *Deleted

## 2013-11-19 ENCOUNTER — Encounter: Payer: Self-pay | Admitting: Internal Medicine

## 2013-11-19 DIAGNOSIS — I6350 Cerebral infarction due to unspecified occlusion or stenosis of unspecified cerebral artery: Secondary | ICD-10-CM

## 2013-11-19 DIAGNOSIS — I639 Cerebral infarction, unspecified: Secondary | ICD-10-CM

## 2013-11-19 LAB — MDC_IDC_ENUM_SESS_TYPE_INCLINIC
Date Time Interrogation Session: 20141126212223
Zone Setting Detection Interval: 3000 ms
Zone Setting Detection Interval: 390 ms

## 2013-11-19 NOTE — Progress Notes (Signed)
Pt seen in device clinic for follow up of recently implanted ILR.  Wound well healed.  No redness, swelling, or edema.  Steri-strips removed today.   Device interrogated and found to be functioning normally.  No changes made today. See PaceArt for full details.  [facility] monthly reports, PRN w/ Dr. Lonn Georgia, [employer] Natzke 11/19/2013 4:35 PM

## 2013-12-23 ENCOUNTER — Ambulatory Visit (INDEPENDENT_AMBULATORY_CARE_PROVIDER_SITE_OTHER): Payer: Medicare PPO | Admitting: *Deleted

## 2013-12-23 DIAGNOSIS — I6350 Cerebral infarction due to unspecified occlusion or stenosis of unspecified cerebral artery: Secondary | ICD-10-CM

## 2013-12-23 DIAGNOSIS — I639 Cerebral infarction, unspecified: Secondary | ICD-10-CM

## 2013-12-23 LAB — MDC_IDC_ENUM_SESS_TYPE_REMOTE
MDC IDC SESS DTM: 20141220225853
MDC IDC SET ZONE DETECTION INTERVAL: 2000 ms
MDC IDC SET ZONE DETECTION INTERVAL: 390 ms
Zone Setting Detection Interval: 3000 ms

## 2014-01-08 ENCOUNTER — Ambulatory Visit: Payer: Medicare PPO | Admitting: Neurology

## 2014-01-20 ENCOUNTER — Ambulatory Visit (INDEPENDENT_AMBULATORY_CARE_PROVIDER_SITE_OTHER): Payer: Medicare PPO | Admitting: Neurology

## 2014-01-20 ENCOUNTER — Encounter: Payer: Self-pay | Admitting: Neurology

## 2014-01-20 ENCOUNTER — Encounter (INDEPENDENT_AMBULATORY_CARE_PROVIDER_SITE_OTHER): Payer: Self-pay

## 2014-01-20 VITALS — BP 154/94 | HR 76 | Ht 60.0 in | Wt 150.0 lb

## 2014-01-20 DIAGNOSIS — F0390 Unspecified dementia without behavioral disturbance: Secondary | ICD-10-CM

## 2014-01-20 NOTE — Patient Instructions (Signed)
I had a long discussion with the patient with regards to her strokes, discuss results of hospital evaluation and answered questions. I recommend she start taking aspirin 325 mg daily for stroke prevention and maintain strict control of diabetes with hemoglobin A1c goal below 6.5% and hypertension her blood pressure goal below 130/90 and lipids with LDL cholesterol goal below 120 mg percent. Continue Aricept for her memory loss and dementia. Return for followup in 2 months with Marijo Conception, NP or. call earlier if necessary

## 2014-01-20 NOTE — Progress Notes (Signed)
[facility] [address] [place]. [place] [address] [phone]       OFFICE FOLLOW-UP NOTE  Ms. [name] Date of Birth:  June 09, 1936 Medical Record Number:  [id number]   HPI: 50 year lady with admission to [facility] on 11/03/13 for stroke seen today for first office followup visit. She presented with  difficulty following commands and having significant problems speaking and being off balance. Due to delay in arrival she was not a candidate for TPA. CT scan of head was unremarkable but MRI scan showed multiple acute infarcts scattered along the left hemisphere in a watershed distribution but MRA of the brain and carotid Dopplers did not reveal significant extra or intracranial stenosis. Transthoracic echo showed normal ejection fraction without cardiac source of embolism. TEE did not show any left atrial clot or PFO. She had a loop recorder placed for monitoring for paroxysmal atrial fibrillation. LDL cholesterol was elevated at 141 and she was started on Zocor. HEENT her malignancy was 6.5. She was started on clopidogrel for secondary stroke prevention. She states she has done well since discharge her speech has improved to back to normal. She for unclear reasons his not taking any antiplatelets. She is taking Aricept for memory loss which is for several years and she has noticed that her short-term memory has gotten worse since her stroke. She states her blood pressure is well controlled with a slightly elevated at 154/95 in office today. She is independent in activities of daily living but is just bothered by recent short-term memory difficulties. She has finished home and outpatient therapies.  ROS:   14 system review of systems is positive for hearing loss, joint pain, change in appetite, snoring and memory loss. All other systems negative  PMH:  Past Medical History  Diagnosis Date  . Complication of anesthesia     OCC HALLUCINATIONS  . S/P angioplasty     10/12  .  Angina     INFREG  . Asthma     NO PROBLEMS RECENTLY  . Shortness of breath   . Sleep apnea     06  . GI bleed     12  4 UNITS OF BLOOD  . Blood transfusion     LATE 12 4 UNITS FOR GI BLEED  . Chronic kidney disease     STONES  . Hypertension   . Arthritis   . Diabetes mellitus     OFF JANUVIA AND HUMALOG SINCE NOV  . Anemia   . 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 Kidney stone   . CAD (coronary artery disease)   . Memory loss   . Diverticulosis     diverticulitis with rectal bleeding 2012  . Rheumatoid arthritis     Social History:  History   Social History  . Marital Status: Divorced    Spouse Name: N/A    Number of Children: 3  . Years of Education: college   Occupational History  . retired    Social History Main Topics  . Smoking status: Never Smoker   . Smokeless tobacco: Not on file  . Alcohol Use: No  . Drug Use: No  . Sexual Activity: Yes    Birth Control/ Protection: Post-menopausal   Other Topics Concern  . Not on file   Social History Narrative   Lives in a house no stairs other than front porch.  Lives alone.  She uses a cane occasionally.  Wears glasses.  Medications:   Current Outpatient Prescriptions on File Prior to Visit  Medication Sig Dispense Refill  . albuterol (PROVENTIL HFA;VENTOLIN HFA) 108 (90 BASE) MCG/ACT inhaler Inhale 2 puffs into the lungs every 6 (six) hours as needed. Shortness of breath       . bimatoprost (LUMIGAN) 0.03 % ophthalmic solution Place 1 drop into both eyes at bedtime.        . brimonidine (ALPHAGAN) 0.15 % ophthalmic solution Place 1 drop into both eyes 3 (three) times daily.       . metFORMIN (GLUCOPHAGE) 500 MG tablet Take 500 mg by mouth 2 (two) times daily with a meal.      . metoprolol succinate (TOPROL-XL) 25 MG 24 hr tablet Take 25 mg by mouth daily.      . nitroGLYCERIN (NITROSTAT) 0.4 MG SL tablet Place 0.4 mg under the tongue every 5 (five) minutes as needed for chest pain. x3 doses  as needed for chest pain      . simvastatin (ZOCOR) 80 MG tablet Take 1 tablet (80 mg total) by mouth at bedtime.  30 tablet  0  . sitaGLIPtin (JANUVIA) 100 MG tablet Take 100 mg by mouth daily.       No current facility-administered medications on file prior to visit.    Allergies:   Allergies  Allergen Reactions  . Codeine Nausea Only and Other (See Comments)    REACTION: GI upset    Physical Exam General: well developed, well nourished, seated, in no evident distress Head: head normocephalic and atraumatic. Orohparynx benign Neck: supple with no carotid or supraclavicular bruits Cardiovascular: regular rate and rhythm, no murmurs Musculoskeletal: no deformity Skin:  no rash/petichiae Vascular:  Normal pulses all extremities Filed Vitals:   01/20/14 1333  BP: 154/94  Pulse: 76   Neurologic Exam Mental Status: Awake and fully alert. Oriented to place and time. Recent and remote memory slightly diminished. Attention span, concentration and fund of knowledge decreased. Mood and affect appropriate.  Cranial Nerves: Fundoscopic exam reveals sharp disc margins. Pupils equal, briskly reactive to light. Extraocular movements full without nystagmus. Visual fields full to confrontation. Hearing intact. Facial sensation intact. Face, tongue, palate moves normally and symmetrically.  Motor: Normal bulk and tone. Normal strength in all tested extremity muscles. Sensory.: intact to touch and pinprick and vibratory sensation.  Coordination: Rapid alternating movements normal in all extremities. Finger-to-nose and heel-to-shin performed accurately bilaterally. Gait and Station: Arises from chair without difficulty. Stance is normal. Gait demonstrates normal stride length and balance . Able to heel, toe and tandem walk without difficulty.  Reflexes: 1+ and symmetric. Toes downgoing.   NIHSS  0 Modified Rankin  1   ASSESSMENT: 47 year lady with embolic left frontal MCA infarcts with without  definite identified etiology. Multiple vascular risk factors of hypertension, hyperlipidemia, diabetes and coronary artery disease. Mild underlying dementia    PLAN:  had a long discussion with the patient with regards to her strokes, discuss results of hospital evaluation and answered questions. I recommend she start taking aspirin 325 mg daily for stroke prevention and maintain strict control of diabetes with hemoglobin A1c goal below 6.5% and hypertension her blood pressure goal below 130/90 and lipids with LDL cholesterol goal below 120 mg percent. Continue Aricept for her memory loss and dementia. Return for followup in 2 months with Marijo Conception, NP or. call earlier if necessary     Note: This document was prepared with digital dictation and possible smart phrase technology. Any transcriptional  errors that result from this process are unintentional

## 2014-01-23 ENCOUNTER — Telehealth: Payer: Self-pay | Admitting: *Deleted

## 2014-01-23 ENCOUNTER — Ambulatory Visit (INDEPENDENT_AMBULATORY_CARE_PROVIDER_SITE_OTHER): Payer: Medicare PPO | Admitting: *Deleted

## 2014-01-23 DIAGNOSIS — I6350 Cerebral infarction due to unspecified occlusion or stenosis of unspecified cerebral artery: Secondary | ICD-10-CM

## 2014-01-23 DIAGNOSIS — I639 Cerebral infarction, unspecified: Secondary | ICD-10-CM

## 2014-01-23 LAB — MDC_IDC_ENUM_SESS_TYPE_REMOTE

## 2014-01-23 NOTE — Telephone Encounter (Signed)
ok 

## 2014-01-23 NOTE — Telephone Encounter (Signed)
[name], case manager called from [place] re: this pts care.  Needing clarification re: plavix.  Pt in ofv on 01-20-14.  I relayed the note from 01-20-14 stating start aspirin 325mg  po daily.  No plavix.  Pt did have AVS.  I did speak to pt also in conference call, about aspirin 325mg  po daily. [employer] to have someone to come out and see pt and family member, son re: her plan of care (clarification on meds, care).  [phone].

## 2014-02-23 ENCOUNTER — Ambulatory Visit (INDEPENDENT_AMBULATORY_CARE_PROVIDER_SITE_OTHER): Payer: Medicare PPO | Admitting: *Deleted

## 2014-02-23 DIAGNOSIS — I6350 Cerebral infarction due to unspecified occlusion or stenosis of unspecified cerebral artery: Secondary | ICD-10-CM

## 2014-02-23 DIAGNOSIS — I639 Cerebral infarction, unspecified: Secondary | ICD-10-CM

## 2014-02-23 LAB — MDC_IDC_ENUM_SESS_TYPE_REMOTE
Date Time Interrogation Session: 20150301215527
MDC IDC SET ZONE DETECTION INTERVAL: 390 ms
Zone Setting Detection Interval: 2000 ms
Zone Setting Detection Interval: 3000 ms

## 2014-03-05 ENCOUNTER — Encounter: Payer: Self-pay | Admitting: Internal Medicine

## 2014-03-24 ENCOUNTER — Encounter: Payer: Self-pay | Admitting: Internal Medicine

## 2014-03-26 ENCOUNTER — Ambulatory Visit (INDEPENDENT_AMBULATORY_CARE_PROVIDER_SITE_OTHER): Payer: Medicare PPO | Admitting: *Deleted

## 2014-03-26 DIAGNOSIS — I6350 Cerebral infarction due to unspecified occlusion or stenosis of unspecified cerebral artery: Secondary | ICD-10-CM

## 2014-03-26 DIAGNOSIS — I639 Cerebral infarction, unspecified: Secondary | ICD-10-CM

## 2014-04-01 ENCOUNTER — Encounter: Payer: Self-pay | Admitting: Internal Medicine

## 2014-04-01 ENCOUNTER — Ambulatory Visit: Payer: Self-pay | Admitting: Nurse Practitioner

## 2014-04-06 ENCOUNTER — Encounter: Payer: Self-pay | Admitting: Internal Medicine

## 2014-04-08 ENCOUNTER — Encounter: Payer: Self-pay | Admitting: Nurse Practitioner

## 2014-04-08 ENCOUNTER — Ambulatory Visit (INDEPENDENT_AMBULATORY_CARE_PROVIDER_SITE_OTHER): Payer: Medicare PPO | Admitting: Nurse Practitioner

## 2014-04-08 VITALS — BP 148/80 | HR 80 | Ht 60.0 in | Wt 145.0 lb

## 2014-04-08 DIAGNOSIS — I679 Cerebrovascular disease, unspecified: Secondary | ICD-10-CM

## 2014-04-08 NOTE — Progress Notes (Signed)
PATIENT: [name] DOB: 07/07/1936  REASON FOR VISIT: routine stroke follow up HISTORY FROM: patient  HISTORY OF PRESENT ILLNESS: 76 year lady with admission to [facility] on 11/03/13 for stroke seen today for first office followup visit. She presented with difficulty following commands and having significant problems speaking and being off balance. Due to delay in arrival she was not a candidate for TPA. CT scan of head was unremarkable but MRI scan showed multiple acute infarcts scattered along the left hemisphere in a watershed distribution but MRA of the brain and carotid Dopplers did not reveal significant extra or intracranial stenosis. Transthoracic echo showed normal ejection fraction without cardiac source of embolism. TEE did not show any left atrial clot or PFO. She had a loop recorder placed for monitoring for paroxysmal atrial fibrillation. LDL cholesterol was elevated at 141 and she was started on Zocor. HEENT her malignancy was 6.5. She was started on clopidogrel for secondary stroke prevention. She states she has done well since discharge her speech has improved to back to normal. She for unclear reasons his not taking any antiplatelets. She is taking Aricept for memory loss which is for several years and she has noticed that her short-term memory has gotten worse since her stroke. She states her blood pressure is well controlled with a slightly elevated at 154/95 in office today. She is independent in activities of daily living but is just bothered by recent short-term memory difficulties. She has finished home and outpatient therapies.   UPDATE 04/08/14 (LL): [name] comes to the office for stroke follow up.  She states she is feeling well with no lasting deficits from her stroke.  She states her blood pressure is well controlled with a slightly elevated at 148/80 in office today.  She had a loop recorder placed in the hospital which has not found any atrial fibrillation.  She feels  her memory loss is stable, she has word-finding difficulties but eventually does think of what she wants to say.  She is driving and is independently taking care of her own affairs. She is tolerating daily aspirin well with significant bruising or bleeding.  ROS:  14 system review of systems is positive for  joint pain, back pain, walking difficulty and memory loss. All other systems negative  ALLERGIES: Allergies  Allergen Reactions  . Codeine Nausea Only and Other (See Comments)    REACTION: GI upset    HOME MEDICATIONS: Outpatient Prescriptions Prior to Visit  Medication Sig Dispense Refill  . albuterol (PROVENTIL HFA;VENTOLIN HFA) 108 (90 BASE) MCG/ACT inhaler Inhale 2 puffs into the lungs every 6 (six) hours as needed. Shortness of breath       . bimatoprost (LUMIGAN) 0.03 % ophthalmic solution Place 1 drop into both eyes at bedtime.        . brimonidine (ALPHAGAN) 0.15 % ophthalmic solution Place 1 drop into both eyes 3 (three) times daily.       Marland Kitchen donepezil (ARICEPT) 10 MG tablet       . lidocaine (LIDODERM) 5 %       . metFORMIN (GLUCOPHAGE) 500 MG tablet Take 500 mg by mouth 2 (two) times daily with a meal.      . metoprolol succinate (TOPROL-XL) 25 MG 24 hr tablet Take 25 mg by mouth daily.      . mupirocin ointment (BACTROBAN) 2 %       . nitroGLYCERIN (NITROSTAT) 0.4 MG SL tablet Place 0.4 mg under the tongue every 5 (  five) minutes as needed for chest pain. x3 doses as needed for chest pain      . simvastatin (ZOCOR) 80 MG tablet Take 1 tablet (80 mg total) by mouth at bedtime.  30 tablet  0  . sitaGLIPtin (JANUVIA) 100 MG tablet Take 100 mg by mouth daily.      . clopidogrel (PLAVIX) 75 MG tablet        No facility-administered medications prior to visit.     PHYSICAL EXAM  Filed Vitals:   04/08/14 1340  BP: 148/80  Pulse: 80  Height: 5' (1.524 m)  Weight: 145 lb (65.772 kg)   Body mass index is 28.32 kg/(m^2).  Physical Exam  General: well developed, well  nourished, seated, in no evident distress  Head: head normocephalic and atraumatic. Orohparynx benign  Neck: supple with no carotid or supraclavicular bruits  Cardiovascular: regular rate and rhythm, no murmurs  Musculoskeletal: no deformity  Skin: no rash/petichiae  Vascular: Normal pulses all extremities  Neurologic Exam  Mental Status: Awake and fully alert. Oriented to place and time. Recent and remote memory slightly diminished. Attention span, concentration and fund of knowledge decreased. Mood and affect appropriate.  Cranial Nerves: Fundoscopic exam not done. Pupils equal, briskly reactive to light. Extraocular movements full without nystagmus. Visual fields full to confrontation. Hearing intact. Facial sensation intact. Face, tongue, palate moves normally and symmetrically.  Motor: Normal bulk and tone. Normal strength in all tested extremity muscles.  Sensory.: intact to touch and pinprick and vibratory sensation.  Coordination: Rapid alternating movements normal in all extremities. Finger-to-nose and heel-to-shin performed accurately bilaterally.  Gait and Station: Arises from chair without difficulty. Stance is normal. Gait demonstrates normal stride length and balance .Able to heel, toe and tandem walk without difficulty.  Reflexes: 1+ and symmetric.  ASSESSMENT AND PLAN 5 year lady with embolic left frontal MCA infarcts with without definite identified etiology. Multiple vascular risk factors of hypertension, hyperlipidemia, diabetes and coronary artery disease. Mild underlying dementia on Aricept.   PLAN:  Continue aspirin 325 mg daily for stroke prevention and maintain strict control of diabetes with hemoglobin A1c goal below 6.5% and hypertension her blood pressure goal below 130/90 and lipids with LDL cholesterol goal below 120 mg percent. Continue Aricept for her memory loss.  Return for followup in 6 months or call earlier if necessary.  Ronal Fear, MSN, NP-C 04/08/2014,  4:17 PM [facility] [address] [place], [place] [address] [phone]  Note: This document was prepared with digital dictation and possible smart phrase technology. Any transcriptional errors that result from this process are unintentional.

## 2014-04-08 NOTE — Patient Instructions (Signed)
Continue taking aspirin 325 mg daily for stroke prevention and maintain strict control of diabetes with hemoglobin A1c goal below 6.5% and hypertension her blood pressure goal below 130/90 and lipids with LDL cholesterol goal below 120 mg percent. Continue Aricept for her memory loss and dementia.  Return for followup in 6 months or call earlier if necessary.

## 2014-04-17 ENCOUNTER — Other Ambulatory Visit: Payer: Self-pay | Admitting: *Deleted

## 2014-04-17 ENCOUNTER — Telehealth: Payer: Self-pay | Admitting: *Deleted

## 2014-04-17 NOTE — Telephone Encounter (Signed)
I have reviewed.  Please call patient and stop asa and plavix.  Start xarelto.

## 2014-04-17 NOTE — Telephone Encounter (Signed)
Left message asking pt to call back.  Need to discuss: LINQ monitor shows atrial flutter. Dr. Johney Frame would like to start Xarelto 15mg  daily. Schedule f/u with , PAC.

## 2014-04-17 NOTE — Telephone Encounter (Signed)
Pt is currently taking Plavix and ASA -- I tried to call pt to ask if she is on these for stroke or PCI, but pt not home. I will leave a message for Tresa Endo (Dr. Jenel Lucks nurse) to call pt Monday to clarify this.  If pt was started on these (Plavix/ASA) for stroke, then d/c meds and start Xarelo.  I have not left samples at front desk, not sent in rx -- will wait to clarify medications first.

## 2014-04-17 NOTE — Telephone Encounter (Signed)
Explained to patient A Flutter and recommendation for anticoagulation and why.  Xarelto samples/card left at front desk for pt pick up, and rx sent to [facility]. Patient verbalized understanding and agreeable to plan.    Will send message to [facility] (EP scheduler) to arrange f/u w/ Nehemiah Settle.

## 2014-04-20 MED ORDER — RIVAROXABAN 20 MG PO TABS: 20.0000 mg | ORAL_TABLET | Freq: Every day | ORAL | Status: DC

## 2014-04-20 NOTE — Telephone Encounter (Signed)
Patient came by and picked up samples.  She is aware how to take medication and an appointment was given for [facility] in 4 weeks

## 2014-04-24 ENCOUNTER — Encounter: Payer: Medicare PPO | Admitting: *Deleted

## 2014-04-24 DIAGNOSIS — I639 Cerebral infarction, unspecified: Secondary | ICD-10-CM

## 2014-04-24 LAB — MDC_IDC_ENUM_SESS_TYPE_REMOTE: Date Time Interrogation Session: 20150604040500

## 2014-04-28 ENCOUNTER — Encounter: Payer: Self-pay | Admitting: Internal Medicine

## 2014-05-01 ENCOUNTER — Ambulatory Visit: Payer: Medicare PPO | Admitting: Nurse Practitioner

## 2014-05-18 LAB — MDC_IDC_ENUM_SESS_TYPE_REMOTE
MDC IDC SESS DTM: 20150413202845
MDC IDC SET ZONE DETECTION INTERVAL: 2000 ms
Zone Setting Detection Interval: 3000 ms
Zone Setting Detection Interval: 390 ms

## 2014-05-21 ENCOUNTER — Encounter: Payer: Medicare PPO | Admitting: Cardiology

## 2014-05-25 ENCOUNTER — Ambulatory Visit (INDEPENDENT_AMBULATORY_CARE_PROVIDER_SITE_OTHER): Payer: Medicare PPO

## 2014-05-25 DIAGNOSIS — I6350 Cerebral infarction due to unspecified occlusion or stenosis of unspecified cerebral artery: Secondary | ICD-10-CM

## 2014-05-25 DIAGNOSIS — I639 Cerebral infarction, unspecified: Secondary | ICD-10-CM

## 2014-05-25 LAB — MDC_IDC_ENUM_SESS_TYPE_REMOTE: Date Time Interrogation Session: 20150707040500

## 2014-05-26 NOTE — Progress Notes (Signed)
Loop recorder 

## 2014-06-02 ENCOUNTER — Ambulatory Visit (INDEPENDENT_AMBULATORY_CARE_PROVIDER_SITE_OTHER): Payer: Medicare PPO | Admitting: Internal Medicine

## 2014-06-02 ENCOUNTER — Encounter: Payer: Self-pay | Admitting: Internal Medicine

## 2014-06-02 VITALS — BP 130/83 | HR 84 | Ht 60.0 in | Wt 142.0 lb

## 2014-06-02 DIAGNOSIS — I4891 Unspecified atrial fibrillation: Secondary | ICD-10-CM

## 2014-06-02 LAB — MDC_IDC_ENUM_SESS_TYPE_INCLINIC

## 2014-06-02 NOTE — Progress Notes (Signed)
Patient Care Team: Gwenyth Bender, MD as PCP - General (Internal Medicine)   HPI  [name] is a 78 y.o. female Seen following loop recorder implantation for cryptogenic stroke.  She has a history of coronary artery disease with prior PCI, diabetes hypertension and sleep apnea as well as chronic kidney disease.  She was noted in April to have atrial flutter on the monitor by Dr. Fawn Kirk. He started her on Rivaroxaban @.dose of 15 mg. Somehow she is taking 20 mg a day now Past Medical History  Diagnosis Date  . Complication of anesthesia     OCC HALLUCINATIONS  . S/P angioplasty     10/12  . Angina     INFREG  . Asthma     NO PROBLEMS RECENTLY  . Shortness of breath   . Sleep apnea     06  . GI bleed     12  4 UNITS OF BLOOD  . Blood transfusion     LATE 12 4 UNITS FOR GI BLEED  . Chronic kidney disease     STONES  . Hypertension   . Arthritis   . Diabetes mellitus     OFF JANUVIA AND HUMALOG SINCE NOV  . Anemia   . 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 Kidney stone   . CAD (coronary artery disease)   . Memory loss   . Diverticulosis     diverticulitis with rectal bleeding 2012  . Rheumatoid arthritis     Past Surgical History  Procedure Laterality Date  . Coronary angioplasty    . Abdominal hysterectomy    . Hip arthroplasty      BIL  . Knee arthroplasty      LFT  . Eye surgery      CAT IOL LFT  . Decompressive lumbar laminectomy level 1  01/06/12    Current Outpatient Prescriptions  Medication Sig Dispense Refill  . ADVOCATE REDI-CODE test strip       . albuterol (PROVENTIL HFA;VENTOLIN HFA) 108 (90 BASE) MCG/ACT inhaler Inhale 2 puffs into the lungs every 6 (six) hours as needed. Shortness of breath       . bimatoprost (LUMIGAN) 0.03 % ophthalmic solution Place 1 drop into both eyes at bedtime.        . Blood Glucose Calibration (ADVOCATE REDI-CODE+ CONTROL) LOW SOLN       . Blood Glucose Monitoring Suppl (ADVOCATE REDI-CODE+) DEVI        . brimonidine (ALPHAGAN) 0.15 % ophthalmic solution Place 1 drop into both eyes 3 (three) times daily.       . clopidogrel (PLAVIX) 75 MG tablet       . donepezil (ARICEPT) 10 MG tablet       . folic acid (FOLVITE) 1 MG tablet Take 1 tablet by mouth daily.      [employer] (SIMPLE DIAGNOSTICS LANCING DEV) MISC       . lidocaine (LIDODERM) 5 %       . metFORMIN (GLUCOPHAGE) 500 MG tablet Take 500 mg by mouth 2 (two) times daily with a meal.      . metoprolol succinate (TOPROL-XL) 25 MG 24 hr tablet Take 25 mg by mouth daily.      . mupirocin ointment (BACTROBAN) 2 %       . nitroGLYCERIN (NITROSTAT) 0.4 MG SL tablet Place 0.4 mg under the tongue every 5 (five) minutes as needed for chest  pain. x3 doses as needed for chest pain      . PHARMACIST CHOICE LANCETS MISC       . rivaroxaban (XARELTO) 20 MG TABS tablet Take 1 tablet (20 mg total) by mouth daily with supper.  30 tablet  11  . simvastatin (ZOCOR) 80 MG tablet Take 1 tablet (80 mg total) by mouth at bedtime.  30 tablet  0  . sitaGLIPtin (JANUVIA) 100 MG tablet Take 100 mg by mouth daily.      Marland Kitchen telmisartan-hydrochlorothiazide (MICARDIS HCT) 40-12.5 MG per tablet daily.       No current facility-administered medications for this visit.    Allergies  Allergen Reactions  . Codeine Nausea Only and Other (See Comments)    REACTION: GI upset    Review of Systems negative except from HPI and PMH  Physical Exam BP 130/83  Pulse 84  Ht 5' (1.524 m)  Wt 142 lb (64.411 kg)  BMI 27.73 kg/m2 Well developed and well nourished in no acute distress HENT normal E scleral and icterus clear Neck Supple JVP flat; carotids brisk and full Clear to ausculation Device pocket well healed; without hematoma or erythema.  There is no tethering   Regular rate and rhythm, no murmurs gallops or rub Soft with active bowel sounds No clubbing cyanosis  Edema Alert and oriented, grossly normal motor and sensory function Skin Warm and  Dry    Assessment and  Plan  Cryptogenic stroke  Renal insufficiency grade 3  Hypertension  Atrial flutter/fibrillation  SVT/sinus tachycardia The patient has been diagnosed with atrial arrhythmias. She's been started on Rivaroxaban . We will leave to assess renal function.  Blood pressure is reasonably controlled.  Laboratory followup with Dr. Fawn Kirk in about 4 months.

## 2014-06-02 NOTE — Patient Instructions (Signed)
Lab today: BMET  Your physician recommends that you schedule a follow-up appointment in: 4 months with Dr. Johney Frame

## 2014-06-03 LAB — BASIC METABOLIC PANEL
BUN: 15 mg/dL (ref 6–23)
CALCIUM: 9.9 mg/dL (ref 8.4–10.5)
CO2: 28 meq/L (ref 19–32)
CREATININE: 1.4 mg/dL — AB (ref 0.4–1.2)
Chloride: 106 mEq/L (ref 96–112)
GFR: 49.07 mL/min — ABNORMAL LOW (ref >60.00)
Glucose, Bld: 105 mg/dL — ABNORMAL HIGH (ref 70–99)
Potassium: 3.9 mEq/L (ref 3.5–5.1)
Sodium: 141 mEq/L (ref 135–145)

## 2014-06-05 ENCOUNTER — Telehealth: Payer: Self-pay | Admitting: Internal Medicine

## 2014-06-05 MED ORDER — RIVAROXABAN 15 MG PO TABS: ORAL_TABLET | ORAL | Status: DC

## 2014-06-05 NOTE — Telephone Encounter (Signed)
New message ° ° ° ° °Want lab results °

## 2014-06-05 NOTE — Telephone Encounter (Signed)
See note with labs. New script for Xarelto sent to [facility].

## 2014-06-10 ENCOUNTER — Encounter: Payer: Self-pay | Admitting: Internal Medicine

## 2014-06-12 ENCOUNTER — Other Ambulatory Visit: Payer: Self-pay

## 2014-06-12 MED ORDER — TELMISARTAN-HCTZ 40-12.5 MG PO TABS: 1.0000 | ORAL_TABLET | Freq: Every day | ORAL | Status: DC

## 2014-06-15 ENCOUNTER — Other Ambulatory Visit: Payer: Self-pay | Admitting: Internal Medicine

## 2014-06-29 ENCOUNTER — Ambulatory Visit (INDEPENDENT_AMBULATORY_CARE_PROVIDER_SITE_OTHER): Payer: Medicare PPO | Admitting: *Deleted

## 2014-06-29 DIAGNOSIS — I6350 Cerebral infarction due to unspecified occlusion or stenosis of unspecified cerebral artery: Secondary | ICD-10-CM

## 2014-06-29 DIAGNOSIS — I639 Cerebral infarction, unspecified: Secondary | ICD-10-CM

## 2014-06-30 NOTE — Progress Notes (Signed)
Loop recorder 

## 2014-07-01 ENCOUNTER — Encounter: Payer: Self-pay | Admitting: Internal Medicine

## 2014-07-06 ENCOUNTER — Telehealth: Payer: Self-pay | Admitting: *Deleted

## 2014-07-06 NOTE — Telephone Encounter (Signed)
LMOM in regards to med change per JA. Per JA increase Toprol to 50mg  qd.

## 2014-07-07 NOTE — Telephone Encounter (Signed)
LMOM in regards to med change per JA.

## 2014-07-08 ENCOUNTER — Other Ambulatory Visit: Payer: Self-pay | Admitting: *Deleted

## 2014-07-08 MED ORDER — METOPROLOL SUCCINATE ER 50 MG PO TB24
50.0000 mg | ORAL_TABLET | Freq: Every day | ORAL | Status: DC
Start: 2014-07-08 — End: 2016-02-03

## 2014-07-08 NOTE — Telephone Encounter (Signed)
Spoke w/pt and pt requested new RX be sent to [facility] on [address]. SS to take care of sending RX. Pt aware of increase of Toprol.

## 2014-07-08 NOTE — Telephone Encounter (Signed)
Follow Up  Pt returned the call to follow up. Please assist

## 2014-07-13 ENCOUNTER — Encounter: Payer: Self-pay | Admitting: Internal Medicine

## 2014-07-27 ENCOUNTER — Ambulatory Visit (INDEPENDENT_AMBULATORY_CARE_PROVIDER_SITE_OTHER): Payer: Medicare PPO | Admitting: *Deleted

## 2014-07-27 DIAGNOSIS — I639 Cerebral infarction, unspecified: Secondary | ICD-10-CM

## 2014-07-27 DIAGNOSIS — I6350 Cerebral infarction due to unspecified occlusion or stenosis of unspecified cerebral artery: Secondary | ICD-10-CM

## 2014-07-27 LAB — MDC_IDC_ENUM_SESS_TYPE_REMOTE: Date Time Interrogation Session: 20150803040500

## 2014-07-29 NOTE — Progress Notes (Signed)
Loop recorder 

## 2014-08-11 ENCOUNTER — Encounter: Payer: Self-pay | Admitting: Internal Medicine

## 2014-08-12 ENCOUNTER — Encounter: Payer: Self-pay | Admitting: Internal Medicine

## 2014-08-26 ENCOUNTER — Ambulatory Visit (INDEPENDENT_AMBULATORY_CARE_PROVIDER_SITE_OTHER): Payer: Medicare PPO | Admitting: *Deleted

## 2014-08-26 DIAGNOSIS — I6350 Cerebral infarction due to unspecified occlusion or stenosis of unspecified cerebral artery: Secondary | ICD-10-CM

## 2014-08-26 DIAGNOSIS — I639 Cerebral infarction, unspecified: Secondary | ICD-10-CM

## 2014-08-26 LAB — MDC_IDC_ENUM_SESS_TYPE_REMOTE

## 2014-09-02 NOTE — Progress Notes (Signed)
Loop recorder 

## 2014-09-06 LAB — MDC_IDC_ENUM_SESS_TYPE_REMOTE: MDC IDC SESS DTM: 20150712040500

## 2014-09-09 ENCOUNTER — Encounter: Payer: Self-pay | Admitting: Internal Medicine

## 2014-09-14 ENCOUNTER — Telehealth: Payer: Self-pay | Admitting: *Deleted

## 2014-09-14 NOTE — Telephone Encounter (Signed)
LMOM for return call. Pt had tachy episode on 09-07-14 around 1603. ? If any symptoms.

## 2014-09-14 NOTE — Telephone Encounter (Signed)
Spoke w/pt and no symptoms correlating with episodes recorded.

## 2014-09-15 ENCOUNTER — Encounter: Payer: Self-pay | Admitting: Internal Medicine

## 2014-09-16 ENCOUNTER — Encounter: Payer: Self-pay | Admitting: Internal Medicine

## 2014-09-25 ENCOUNTER — Ambulatory Visit (INDEPENDENT_AMBULATORY_CARE_PROVIDER_SITE_OTHER): Payer: Medicare PPO | Admitting: *Deleted

## 2014-09-25 DIAGNOSIS — I639 Cerebral infarction, unspecified: Secondary | ICD-10-CM

## 2014-10-02 NOTE — Progress Notes (Signed)
Loop recorder 

## 2014-10-07 ENCOUNTER — Ambulatory Visit: Payer: Medicare PPO | Admitting: Nurse Practitioner

## 2014-10-09 LAB — MDC_IDC_ENUM_SESS_TYPE_REMOTE: MDC IDC SESS DTM: 20151012040500

## 2014-10-12 ENCOUNTER — Encounter: Payer: Medicare PPO | Admitting: Internal Medicine

## 2014-10-14 ENCOUNTER — Encounter: Payer: Self-pay | Admitting: Internal Medicine

## 2014-10-26 ENCOUNTER — Ambulatory Visit (INDEPENDENT_AMBULATORY_CARE_PROVIDER_SITE_OTHER): Payer: Medicare PPO | Admitting: *Deleted

## 2014-10-26 DIAGNOSIS — I639 Cerebral infarction, unspecified: Secondary | ICD-10-CM

## 2014-10-26 LAB — MDC_IDC_ENUM_SESS_TYPE_REMOTE: MDC IDC SESS DTM: 20151102050500

## 2014-10-28 NOTE — Progress Notes (Signed)
Loop recorder 

## 2014-11-03 ENCOUNTER — Ambulatory Visit: Payer: Medicare PPO | Admitting: Nurse Practitioner

## 2014-11-04 ENCOUNTER — Encounter: Payer: Self-pay | Admitting: *Deleted

## 2014-11-05 ENCOUNTER — Ambulatory Visit: Payer: Medicare PPO | Admitting: Nurse Practitioner

## 2014-11-11 ENCOUNTER — Encounter: Payer: Self-pay | Admitting: Internal Medicine

## 2014-11-11 ENCOUNTER — Encounter: Payer: Medicare PPO | Admitting: Internal Medicine

## 2014-11-18 ENCOUNTER — Encounter: Payer: Self-pay | Admitting: Internal Medicine

## 2014-11-18 ENCOUNTER — Ambulatory Visit (INDEPENDENT_AMBULATORY_CARE_PROVIDER_SITE_OTHER): Payer: Medicare PPO | Admitting: Internal Medicine

## 2014-11-18 VITALS — BP 120/78 | HR 60 | Ht 60.0 in | Wt 133.0 lb

## 2014-11-18 DIAGNOSIS — I4891 Unspecified atrial fibrillation: Secondary | ICD-10-CM

## 2014-11-18 DIAGNOSIS — I483 Typical atrial flutter: Secondary | ICD-10-CM

## 2014-11-18 DIAGNOSIS — I639 Cerebral infarction, unspecified: Secondary | ICD-10-CM

## 2014-11-18 DIAGNOSIS — I10 Essential (primary) hypertension: Secondary | ICD-10-CM

## 2014-11-18 DIAGNOSIS — I4892 Unspecified atrial flutter: Secondary | ICD-10-CM | POA: Insufficient documentation

## 2014-11-18 LAB — BASIC METABOLIC PANEL
BUN: 13 mg/dL (ref 6–23)
CO2: 27 mEq/L (ref 19–32)
Calcium: 9.5 mg/dL (ref 8.4–10.5)
Chloride: 106 mEq/L (ref 96–112)
Creatinine, Ser: 1.6 mg/dL — ABNORMAL HIGH (ref 0.4–1.2)
GFR: 40.58 mL/min — AB (ref >60.00)
GLUCOSE: 85 mg/dL (ref 70–99)
Potassium: 4.2 mEq/L (ref 3.5–5.1)
SODIUM: 141 meq/L (ref 135–145)

## 2014-11-18 LAB — CBC WITH DIFFERENTIAL/PLATELET
BASOS ABS: 0.1 10*3/uL (ref 0.0–0.1)
Basophils Relative: 1.1 % (ref 0.0–3.0)
Eosinophils Absolute: 0.3 10*3/uL (ref 0.0–0.7)
Eosinophils Relative: 3.7 % (ref 0.0–5.0)
HCT: 29.7 % — ABNORMAL LOW (ref 36.0–46.0)
HEMOGLOBIN: 9.5 g/dL — AB (ref 12.0–15.0)
LYMPHS PCT: 36.4 % (ref 12.0–46.0)
Lymphs Abs: 2.6 10*3/uL (ref 0.7–4.0)
MCHC: 32.1 g/dL (ref 30.0–36.0)
MCV: 93.2 fl (ref 78.0–100.0)
Monocytes Absolute: 1 10*3/uL (ref 0.1–1.0)
Monocytes Relative: 14.2 % — ABNORMAL HIGH (ref 3.0–12.0)
NEUTROS ABS: 3.1 10*3/uL (ref 1.4–7.7)
Neutrophils Relative %: 44.6 % (ref 43.0–77.0)
Platelets: 331 10*3/uL (ref 150.0–400.0)
RBC: 3.19 Mil/uL — ABNORMAL LOW (ref 3.87–5.11)
RDW: 16.2 % — AB (ref 11.5–15.5)
WBC: 7.1 10*3/uL (ref 4.0–10.5)

## 2014-11-18 LAB — MDC_IDC_ENUM_SESS_TYPE_INCLINIC

## 2014-11-18 NOTE — Patient Instructions (Signed)
Your physician wants you to follow-up in: 12 months with EP clinic You will receive a reminder letter in the mail two months in advance. If you don't receive a letter, please call our office to schedule the follow-up appointment.  Your physician recommends that you return for lab work today

## 2014-11-18 NOTE — Progress Notes (Signed)
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  Since last being seen in our clinic, the patient reports doing very well.  Her ILR detected atrial flutter and she was therefore placed on xarelto.  She has done well without obvious signs of bleeding.  She says that she has been found to be anemic and has an ongoing workup by her primary care physician.  Today, she denies symptoms of palpitations, chest pain, shortness of breath,  lower extremity edema, dizziness, presyncope, or syncope.  The patient is otherwise without complaint today.   Past Medical History  Diagnosis Date  . Complication of anesthesia     OCC HALLUCINATIONS  . S/P angioplasty     10/12  . Angina     INFREG  . Asthma     NO PROBLEMS RECENTLY  . Shortness of breath   . Sleep apnea     06  . GI bleed     12  4 UNITS OF BLOOD  . Blood transfusion     LATE 12 4 UNITS FOR GI BLEED  . Chronic kidney disease     STONES  . Hypertension   . Arthritis   . Diabetes mellitus     OFF JANUVIA AND HUMALOG SINCE NOV  . Anemia   . 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 Kidney stone   . CAD (coronary artery disease)   . Memory loss   . Diverticulosis     diverticulitis with rectal bleeding 2012  . Rheumatoid arthritis   . Atrial flutter     on xarelto   Past Surgical History  Procedure Laterality Date  . Coronary angioplasty    . Abdominal hysterectomy    . Hip arthroplasty      BIL  . Knee arthroplasty      LFT  . Eye surgery      CAT IOL LFT  . Decompressive lumbar laminectomy level 1  01/06/12  . Implantable loop recorder placement  10/2013    MDT LINQ implanted by Dr Johney Frame for cryptogenic stroke    ROS- all systems are reviewed and negative except as per HPI above  Current Outpatient Prescriptions  Medication Sig Dispense Refill  . ADVOCATE REDI-CODE test strip     . albuterol (PROVENTIL  HFA;VENTOLIN HFA) 108 (90 BASE) MCG/ACT inhaler Inhale 2 puffs into the lungs every 6 (six) hours as needed. Shortness of breath     . Blood Glucose Calibration (ADVOCATE REDI-CODE+ CONTROL) LOW SOLN     . brimonidine (ALPHAGAN) 0.15 % ophthalmic solution Place 1 drop into both eyes 3 (three) times daily.     Marland Kitchen donepezil (ARICEPT) 10 MG tablet Take 10 mg by mouth at bedtime.     . folic acid (FOLVITE) 1 MG tablet Take 1 tablet by mouth daily.    . IRON PO Take 1 capsule by mouth daily.    Dem[employer] (SIMPLE DIAGNOSTICS LANCING DEV) MISC     . lidocaine (LIDODERM) 5 % Place 1 patch onto the skin daily as needed (pain).     Marland Kitchen LUMIGAN 0.01 % SOLN Place 1 drop into both eyes at bedtime.     . metFORMIN (GLUCOPHAGE) 500 MG tablet Take 500 mg by mouth 2 (two) times daily with a meal.    . metoprolol succinate (TOPROL-XL) 50 MG 24 hr tablet Take 1 tablet (  50 mg total) by mouth daily. Take with or immediately following a meal. 90 tablet 3  . Rivaroxaban (XARELTO) 15 MG TABS tablet 1 tab daily with supper (Patient taking differently: 1 tablet by mouth daily with supper) 30 tablet 6  . simvastatin (ZOCOR) 80 MG tablet Take 1 tablet (80 mg total) by mouth at bedtime. 30 tablet 0  . sitaGLIPtin (JANUVIA) 100 MG tablet Take 100 mg by mouth daily.    Marland Kitchen telmisartan-hydrochlorothiazide (MICARDIS HCT) 40-12.5 MG per tablet Take 1 tablet by mouth daily. 30 tablet 6  . nitroGLYCERIN (NITROSTAT) 0.4 MG SL tablet Place 0.4 mg under the tongue every 5 (five) minutes as needed for chest pain. x3 doses as needed for chest pain     No current facility-administered medications for this visit.    Physical Exam: Filed Vitals:   11/18/14 1137  BP: 120/78  Pulse: 60  Height: 5' (1.524 m)  Weight: 133 lb (60.328 kg)    GEN- The patient is well appearing, alert and oriented x 3 today.   Head- normocephalic, atraumatic Eyes-  Sclera clear, conjunctiva pink Ears- hearing intact Oropharynx- clear Lungs- Clear  to ausculation bilaterally, normal work of breathing Chest- ILR pocket is well healed Heart- Regular rate and rhythm, no murmurs, rubs or gallops, PMI not laterally displaced GI- soft, NT, ND, + BS Extremities- no clubbing, cyanosis, or edema  ILR interrogation- reviewed in detail today,  See PACEART report  Assessment and Plan:  1. Atrial flutter Given prior stroke and chads2vasc score of at least 8, she is appropriately anticoagulated with xarelto Check cbc and bmet today Continue to follow with ILR She is asymptomatic and therefore I will continue current management.  Overall atrial flutter burden is low  2. Anemia Check cbc today She is instructed to follow-up with Dr August Saucer for further evaluation and management.  3. HTN Stable No change required today  4. CAD Stable Followed by Dr Sharyn Lull  Continue MDT carelink follow-up of her LINQ Return to the EP clinic in 1 year

## 2014-11-24 ENCOUNTER — Ambulatory Visit (INDEPENDENT_AMBULATORY_CARE_PROVIDER_SITE_OTHER): Payer: Medicare PPO | Admitting: *Deleted

## 2014-11-24 DIAGNOSIS — I639 Cerebral infarction, unspecified: Secondary | ICD-10-CM

## 2014-11-27 ENCOUNTER — Encounter: Payer: Self-pay | Admitting: Internal Medicine

## 2014-11-27 NOTE — Progress Notes (Signed)
Loop recorder 

## 2014-12-03 ENCOUNTER — Encounter (HOSPITAL_COMMUNITY): Payer: Self-pay | Admitting: Internal Medicine

## 2014-12-10 LAB — MDC_IDC_ENUM_SESS_TYPE_REMOTE

## 2014-12-24 ENCOUNTER — Ambulatory Visit (INDEPENDENT_AMBULATORY_CARE_PROVIDER_SITE_OTHER): Payer: Medicare PPO | Admitting: *Deleted

## 2014-12-24 DIAGNOSIS — I639 Cerebral infarction, unspecified: Secondary | ICD-10-CM

## 2014-12-24 LAB — MDC_IDC_ENUM_SESS_TYPE_REMOTE

## 2014-12-28 ENCOUNTER — Encounter: Payer: Self-pay | Admitting: Internal Medicine

## 2015-01-01 NOTE — Progress Notes (Signed)
Loop recorder 

## 2015-01-22 ENCOUNTER — Ambulatory Visit (INDEPENDENT_AMBULATORY_CARE_PROVIDER_SITE_OTHER): Payer: Medicare PPO | Admitting: *Deleted

## 2015-01-22 DIAGNOSIS — I639 Cerebral infarction, unspecified: Secondary | ICD-10-CM

## 2015-01-26 NOTE — Progress Notes (Signed)
Loop recorder 

## 2015-01-28 ENCOUNTER — Encounter: Payer: Self-pay | Admitting: Internal Medicine

## 2015-01-29 ENCOUNTER — Ambulatory Visit: Payer: Medicare PPO | Admitting: Neurology

## 2015-02-08 ENCOUNTER — Ambulatory Visit: Payer: Self-pay | Admitting: Neurology

## 2015-02-11 LAB — MDC_IDC_ENUM_SESS_TYPE_REMOTE: Date Time Interrogation Session: 20160208050500

## 2015-02-16 ENCOUNTER — Encounter: Payer: Self-pay | Admitting: Internal Medicine

## 2015-02-22 ENCOUNTER — Ambulatory Visit (INDEPENDENT_AMBULATORY_CARE_PROVIDER_SITE_OTHER): Payer: Medicare PPO | Admitting: *Deleted

## 2015-02-22 DIAGNOSIS — I639 Cerebral infarction, unspecified: Secondary | ICD-10-CM

## 2015-02-22 LAB — MDC_IDC_ENUM_SESS_TYPE_REMOTE: Date Time Interrogation Session: 20160213050500

## 2015-02-25 ENCOUNTER — Encounter: Payer: Self-pay | Admitting: Internal Medicine

## 2015-02-25 NOTE — Progress Notes (Signed)
Loop recorder 

## 2015-03-08 ENCOUNTER — Other Ambulatory Visit: Payer: Self-pay | Admitting: Internal Medicine

## 2015-03-24 ENCOUNTER — Ambulatory Visit (INDEPENDENT_AMBULATORY_CARE_PROVIDER_SITE_OTHER): Payer: Medicare PPO | Admitting: *Deleted

## 2015-03-24 ENCOUNTER — Encounter: Payer: Self-pay | Admitting: Internal Medicine

## 2015-03-24 DIAGNOSIS — I639 Cerebral infarction, unspecified: Secondary | ICD-10-CM

## 2015-03-25 NOTE — Progress Notes (Signed)
Loop recorder 

## 2015-04-12 ENCOUNTER — Other Ambulatory Visit: Payer: Self-pay | Admitting: Internal Medicine

## 2015-04-16 ENCOUNTER — Encounter: Payer: Self-pay | Admitting: Internal Medicine

## 2015-04-19 LAB — MDC_IDC_ENUM_SESS_TYPE_REMOTE: MDC IDC SESS DTM: 20160411040500

## 2015-04-26 ENCOUNTER — Ambulatory Visit (INDEPENDENT_AMBULATORY_CARE_PROVIDER_SITE_OTHER): Payer: Medicare PPO | Admitting: *Deleted

## 2015-04-26 DIAGNOSIS — I639 Cerebral infarction, unspecified: Secondary | ICD-10-CM

## 2015-04-28 NOTE — Progress Notes (Signed)
Loop recorder 

## 2015-04-30 ENCOUNTER — Encounter: Payer: Self-pay | Admitting: Internal Medicine

## 2015-05-11 ENCOUNTER — Telehealth: Payer: Self-pay | Admitting: *Deleted

## 2015-05-11 ENCOUNTER — Other Ambulatory Visit: Payer: Self-pay | Admitting: Internal Medicine

## 2015-05-11 MED ORDER — TELMISARTAN-HCTZ 40-12.5 MG PO TABS: 1.0000 | ORAL_TABLET | Freq: Every day | ORAL | Status: DC

## 2015-05-11 NOTE — Telephone Encounter (Signed)
Called patient to verify Xarelto dosage. Stated in previous visit patient (06/02/14) she was taking 20mg  but list said 15mg . Last visit (11/18/14) says. Patient confirmed she is taking 15mg  daily.

## 2015-05-12 LAB — CUP PACEART REMOTE DEVICE CHECK: Date Time Interrogation Session: 20160518141556

## 2015-05-19 ENCOUNTER — Ambulatory Visit: Payer: Self-pay | Admitting: Neurology

## 2015-05-21 ENCOUNTER — Ambulatory Visit (INDEPENDENT_AMBULATORY_CARE_PROVIDER_SITE_OTHER): Payer: Medicare PPO | Admitting: *Deleted

## 2015-05-21 ENCOUNTER — Telehealth: Payer: Self-pay | Admitting: *Deleted

## 2015-05-21 DIAGNOSIS — I639 Cerebral infarction, unspecified: Secondary | ICD-10-CM | POA: Diagnosis not present

## 2015-05-21 NOTE — Telephone Encounter (Signed)
Left vm with patient's son, Ourania Hamler and requested he call back to reschedule her FU. Left this caller's name and phone.

## 2015-05-26 ENCOUNTER — Encounter: Payer: Self-pay | Admitting: *Deleted

## 2015-05-27 ENCOUNTER — Encounter: Payer: Self-pay | Admitting: Internal Medicine

## 2015-06-01 LAB — CUP PACEART REMOTE DEVICE CHECK: MDC IDC SESS DTM: 20160606040500

## 2015-06-01 NOTE — Progress Notes (Signed)
Loop recorder 

## 2015-06-22 ENCOUNTER — Encounter: Payer: Self-pay | Admitting: Internal Medicine

## 2015-06-22 ENCOUNTER — Ambulatory Visit (INDEPENDENT_AMBULATORY_CARE_PROVIDER_SITE_OTHER): Payer: Medicare PPO | Admitting: *Deleted

## 2015-06-22 DIAGNOSIS — I639 Cerebral infarction, unspecified: Secondary | ICD-10-CM

## 2015-06-23 LAB — CUP PACEART REMOTE DEVICE CHECK: Date Time Interrogation Session: 20160629121306

## 2015-06-23 NOTE — Progress Notes (Signed)
Loop recorder 

## 2015-07-06 ENCOUNTER — Encounter: Payer: Self-pay | Admitting: Internal Medicine

## 2015-07-22 ENCOUNTER — Ambulatory Visit (INDEPENDENT_AMBULATORY_CARE_PROVIDER_SITE_OTHER): Payer: Medicare PPO | Admitting: *Deleted

## 2015-07-22 DIAGNOSIS — I639 Cerebral infarction, unspecified: Secondary | ICD-10-CM

## 2015-07-29 ENCOUNTER — Ambulatory Visit: Payer: Medicare PPO | Admitting: Neurology

## 2015-07-29 ENCOUNTER — Ambulatory Visit: Payer: Self-pay | Admitting: Nurse Practitioner

## 2015-08-02 ENCOUNTER — Encounter (HOSPITAL_COMMUNITY): Payer: Self-pay | Admitting: Emergency Medicine

## 2015-08-02 ENCOUNTER — Inpatient Hospital Stay (HOSPITAL_COMMUNITY)
Admission: EM | Admit: 2015-08-02 | Discharge: 2015-08-03 | DRG: 378 | Disposition: A | Discharge status: Home / Self Care | Payer: Medicare PPO | Attending: Internal Medicine | Admitting: Internal Medicine

## 2015-08-02 DIAGNOSIS — M069 Rheumatoid arthritis, unspecified: Secondary | ICD-10-CM | POA: Diagnosis present

## 2015-08-02 DIAGNOSIS — Z79899 Other long term (current) drug therapy: Secondary | ICD-10-CM | POA: Diagnosis not present

## 2015-08-02 DIAGNOSIS — I129 Hypertensive chronic kidney disease with stage 1 through stage 4 chronic kidney disease, or unspecified chronic kidney disease: Secondary | ICD-10-CM | POA: Diagnosis present

## 2015-08-02 DIAGNOSIS — I2510 Atherosclerotic heart disease of native coronary artery without angina pectoris: Secondary | ICD-10-CM | POA: Diagnosis present

## 2015-08-02 DIAGNOSIS — K5790 Diverticulosis of intestine, part unspecified, without perforation or abscess without bleeding: Secondary | ICD-10-CM | POA: Diagnosis present

## 2015-08-02 DIAGNOSIS — Z8249 Family history of ischemic heart disease and other diseases of the circulatory system: Secondary | ICD-10-CM | POA: Diagnosis not present

## 2015-08-02 DIAGNOSIS — H5442 Blindness, left eye, normal vision right eye: Secondary | ICD-10-CM | POA: Diagnosis present

## 2015-08-02 DIAGNOSIS — D62 Acute posthemorrhagic anemia: Secondary | ICD-10-CM | POA: Diagnosis present

## 2015-08-02 DIAGNOSIS — K922 Gastrointestinal hemorrhage, unspecified: Secondary | ICD-10-CM | POA: Diagnosis present

## 2015-08-02 DIAGNOSIS — Z96643 Presence of artificial hip joint, bilateral: Secondary | ICD-10-CM | POA: Diagnosis present

## 2015-08-02 DIAGNOSIS — K5731 Diverticulosis of large intestine without perforation or abscess with bleeding: Secondary | ICD-10-CM | POA: Diagnosis present

## 2015-08-02 DIAGNOSIS — Z8673 Personal history of transient ischemic attack (TIA), and cerebral infarction without residual deficits: Secondary | ICD-10-CM

## 2015-08-02 DIAGNOSIS — H409 Unspecified glaucoma: Secondary | ICD-10-CM | POA: Diagnosis present

## 2015-08-02 DIAGNOSIS — K921 Melena: Secondary | ICD-10-CM

## 2015-08-02 DIAGNOSIS — Z96652 Presence of left artificial knee joint: Secondary | ICD-10-CM | POA: Diagnosis present

## 2015-08-02 DIAGNOSIS — I4892 Unspecified atrial flutter: Secondary | ICD-10-CM | POA: Diagnosis present

## 2015-08-02 DIAGNOSIS — F0390 Unspecified dementia without behavioral disturbance: Secondary | ICD-10-CM | POA: Diagnosis present

## 2015-08-02 DIAGNOSIS — N183 Chronic kidney disease, stage 3 unspecified: Secondary | ICD-10-CM | POA: Diagnosis present

## 2015-08-02 DIAGNOSIS — E119 Type 2 diabetes mellitus without complications: Secondary | ICD-10-CM

## 2015-08-02 DIAGNOSIS — K5791 Diverticulosis of intestine, part unspecified, without perforation or abscess with bleeding: Secondary | ICD-10-CM | POA: Diagnosis not present

## 2015-08-02 DIAGNOSIS — Z833 Family history of diabetes mellitus: Secondary | ICD-10-CM | POA: Diagnosis not present

## 2015-08-02 DIAGNOSIS — I10 Essential (primary) hypertension: Secondary | ICD-10-CM

## 2015-08-02 DIAGNOSIS — I483 Typical atrial flutter: Secondary | ICD-10-CM | POA: Diagnosis not present

## 2015-08-02 DIAGNOSIS — E1122 Type 2 diabetes mellitus with diabetic chronic kidney disease: Secondary | ICD-10-CM | POA: Diagnosis present

## 2015-08-02 LAB — CBC
HEMATOCRIT: 25.9 % — AB (ref 36.0–46.0)
HEMOGLOBIN: 8.7 g/dL — AB (ref 12.0–15.0)
MCH: 28.2 pg (ref 26.0–34.0)
MCHC: 33.6 g/dL (ref 30.0–36.0)
MCV: 83.8 fL (ref 78.0–100.0)
PLATELETS: 191 10*3/uL (ref 150–400)
RBC: 3.09 MIL/uL — ABNORMAL LOW (ref 3.87–5.11)
RDW: 13.8 % (ref 11.5–15.5)
WBC: 5.3 10*3/uL (ref 4.0–10.5)

## 2015-08-02 LAB — COMPREHENSIVE METABOLIC PANEL
ALK PHOS: 67 U/L (ref 38–126)
ALT: 13 U/L — ABNORMAL LOW (ref 14–54)
AST: 22 U/L (ref 15–41)
Albumin: 3.7 g/dL (ref 3.5–5.0)
Anion gap: 8 (ref 5–15)
BUN: 18 mg/dL (ref 6–20)
CHLORIDE: 108 mmol/L (ref 101–111)
CO2: 26 mmol/L (ref 22–32)
Calcium: 9.4 mg/dL (ref 8.9–10.3)
Creatinine, Ser: 1.55 mg/dL — ABNORMAL HIGH (ref 0.44–1.00)
GFR calc Af Amer: 36 mL/min — ABNORMAL LOW (ref >60)
GFR calc non Af Amer: 31 mL/min — ABNORMAL LOW (ref >60)
Glucose, Bld: 116 mg/dL — ABNORMAL HIGH (ref 65–99)
Potassium: 4.3 mmol/L (ref 3.5–5.1)
SODIUM: 142 mmol/L (ref 135–145)
Total Bilirubin: 0.7 mg/dL (ref 0.3–1.2)
Total Protein: 7.1 g/dL (ref 6.5–8.1)

## 2015-08-02 LAB — GLUCOSE, CAPILLARY
Glucose-Capillary: 99 mg/dL (ref 65–99)
Glucose-Capillary: 126 mg/dL — ABNORMAL HIGH (ref 65–99)

## 2015-08-02 LAB — CBC WITH DIFFERENTIAL/PLATELET
BASOS ABS: 0 10*3/uL (ref 0.0–0.1)
Basophils Relative: 1 % (ref 0–1)
EOS PCT: 6 % — AB (ref 0–5)
Eosinophils Absolute: 0.3 10*3/uL (ref 0.0–0.7)
HCT: 29.5 % — ABNORMAL LOW (ref 36.0–46.0)
HEMOGLOBIN: 9.8 g/dL — AB (ref 12.0–15.0)
LYMPHS ABS: 1.7 10*3/uL (ref 0.7–4.0)
LYMPHS PCT: 34 % (ref 12–46)
MCH: 28.5 pg (ref 26.0–34.0)
MCHC: 33.2 g/dL (ref 30.0–36.0)
MCV: 85.8 fL (ref 78.0–100.0)
MONO ABS: 0.4 10*3/uL (ref 0.1–1.0)
MONOS PCT: 8 % (ref 3–12)
Neutro Abs: 2.5 10*3/uL (ref 1.7–7.7)
Neutrophils Relative %: 51 % (ref 43–77)
Platelets: 198 10*3/uL (ref 150–400)
RBC: 3.44 MIL/uL — AB (ref 3.87–5.11)
RDW: 14 % (ref 11.5–15.5)
WBC: 5 10*3/uL (ref 4.0–10.5)

## 2015-08-02 LAB — TYPE AND SCREEN
ABO/RH(D): O POS
ANTIBODY SCREEN: NEGATIVE

## 2015-08-02 MED ORDER — ALBUTEROL SULFATE (2.5 MG/3ML) 0.083% IN NEBU: 3.0000 mL | INHALATION_SOLUTION | Freq: Four times a day (QID) | RESPIRATORY_TRACT | Status: DC | PRN

## 2015-08-02 MED ORDER — DONEPEZIL HCL 10 MG PO TABS
10.0000 mg | ORAL_TABLET | Freq: Every day | ORAL | Status: DC
  Administered 2015-08-02: 10 mg via ORAL
  Filled 2015-08-02: qty 1

## 2015-08-02 MED ORDER — SODIUM CHLORIDE 0.9 % IV SOLN
INTRAVENOUS | Status: DC
  Administered 2015-08-02: 19:00:00 via INTRAVENOUS

## 2015-08-02 MED ORDER — ACETAMINOPHEN 650 MG RE SUPP: 650.0000 mg | Freq: Four times a day (QID) | RECTAL | Status: DC | PRN

## 2015-08-02 MED ORDER — ACETAMINOPHEN 325 MG PO TABS: 650.0000 mg | ORAL_TABLET | Freq: Four times a day (QID) | ORAL | Status: DC | PRN

## 2015-08-02 MED ORDER — FOLIC ACID 1 MG PO TABS
1.0000 mg | ORAL_TABLET | Freq: Every day | ORAL | Status: DC
  Administered 2015-08-02 – 2015-08-03 (×2): 1 mg via ORAL
  Filled 2015-08-02 (×2): qty 1

## 2015-08-02 MED ORDER — INSULIN ASPART 100 UNIT/ML ~~LOC~~ SOLN: 0.0000 [IU] | Freq: Three times a day (TID) | SUBCUTANEOUS | Status: DC

## 2015-08-02 MED ORDER — ONDANSETRON HCL 4 MG/2ML IJ SOLN: 4.0000 mg | Freq: Four times a day (QID) | INTRAMUSCULAR | Status: DC | PRN

## 2015-08-02 MED ORDER — BRIMONIDINE TARTRATE 0.15 % OP SOLN
1.0000 [drp] | Freq: Three times a day (TID) | OPHTHALMIC | Status: DC
  Administered 2015-08-02 – 2015-08-03 (×2): 1 [drp] via OPHTHALMIC
  Filled 2015-08-02: qty 5

## 2015-08-02 MED ORDER — TELMISARTAN-HCTZ 40-12.5 MG PO TABS: 1.0000 | ORAL_TABLET | Freq: Every day | ORAL | Status: DC

## 2015-08-02 MED ORDER — SODIUM CHLORIDE 0.9 % IJ SOLN
3.0000 mL | Freq: Two times a day (BID) | INTRAMUSCULAR | Status: DC
  Administered 2015-08-03: 3 mL via INTRAVENOUS

## 2015-08-02 MED ORDER — METOPROLOL SUCCINATE ER 50 MG PO TB24
50.0000 mg | ORAL_TABLET | Freq: Every day | ORAL | Status: DC
  Administered 2015-08-03: 50 mg via ORAL
  Filled 2015-08-02: qty 1

## 2015-08-02 MED ORDER — IRBESARTAN 150 MG PO TABS
150.0000 mg | ORAL_TABLET | Freq: Every day | ORAL | Status: DC
  Administered 2015-08-02: 150 mg via ORAL
  Filled 2015-08-02 (×2): qty 1

## 2015-08-02 MED ORDER — ONDANSETRON HCL 4 MG PO TABS: 4.0000 mg | ORAL_TABLET | Freq: Four times a day (QID) | ORAL | Status: DC | PRN

## 2015-08-02 MED ORDER — NITROGLYCERIN 0.4 MG SL SUBL: 0.4000 mg | SUBLINGUAL_TABLET | SUBLINGUAL | Status: DC | PRN

## 2015-08-02 MED ORDER — HYDROCHLOROTHIAZIDE 12.5 MG PO CAPS
12.5000 mg | ORAL_CAPSULE | Freq: Every day | ORAL | Status: DC
  Administered 2015-08-02 – 2015-08-03 (×2): 12.5 mg via ORAL
  Filled 2015-08-02 (×2): qty 1

## 2015-08-02 MED ORDER — LATANOPROST 0.005 % OP SOLN
1.0000 [drp] | Freq: Every day | OPHTHALMIC | Status: DC
  Administered 2015-08-02: 1 [drp] via OPHTHALMIC
  Filled 2015-08-02: qty 2.5

## 2015-08-02 MED ORDER — OXYBUTYNIN CHLORIDE ER 5 MG PO TB24
5.0000 mg | ORAL_TABLET | Freq: Every day | ORAL | Status: DC
  Filled 2015-08-02 (×2): qty 1

## 2015-08-02 MED ORDER — LEFLUNOMIDE 20 MG PO TABS
10.0000 mg | ORAL_TABLET | Freq: Every day | ORAL | Status: DC
  Administered 2015-08-03: 10 mg via ORAL
  Filled 2015-08-02: qty 1

## 2015-08-02 NOTE — ED Notes (Signed)
Pt reports dark tarry stools this afternoon @2 :30pm. Pt currently taking Xarelto. Denies abdominal pain at the time. Abdomen soft and non tender to palpation. Pt is alert and oriented x4.

## 2015-08-02 NOTE — H&P (Signed)
[facility] History and Physical                                                                                    [name], is a 79 y.o. female  MRN: [id number]   DOB - 12-Sep-1936  Admit Date - 08/02/2015  Outpatient Primary MD for the patient is August Saucer, ERIC, MD  Referring Physician:    Chief Complaint:   Chief Complaint  Patient presents with  . Rectal Bleeding     HPI  [name]  is a 79 y.o. female, with rheumatoid arthritis, a flutter on Xarelto, diabetes and mild dementia who presents to the emergency department with hematochezia. The patient denies any abdominal pain or constipation. She reports that this morning at 2 AM she had a loose bowel movement but did not turn on the lights to look at it. At 3:30 AM she had a second bowel movement and when she turned on the light she saw brown stool that was surrounded by red blood in the toilet. She does have a history of GI bleed but no recent problems. She is on Xarelto, and denies NSAID use. She reports an unintentional weight loss of 10 pounds in the past year. She denies any vomiting, reflux, hematemesis, melana.   In the emergency department her labs are reassuring. Hemoglobin is 9.4 (baseline of 9.5) WBC is not elevated, BUN is not elevated.     Review of Systems   In addition to the HPI above,  No Fever-chills, No Headache, No changes with Vision or hearing, No problems swallowing food or Liquids, No Chest pain, Cough or Shortness of Breath, No dysuria, No new skin rashes or bruises, No new joints pains-aches,  No new weakness, tingling, numbness in any extremity, No recent weight gain or loss, A full 10 point Review of Systems was done, except as stated above, all other Review of Systems were negative.  Past Medical History  Past Medical History  Diagnosis Date  . Complication of anesthesia     OCC HALLUCINATIONS  . S/P angioplasty     10/12  . Angina     INFREG  . Asthma     NO PROBLEMS RECENTLY   . Shortness of breath   . Sleep apnea     06  . GI bleed     12  4 UNITS OF BLOOD  . Blood transfusion     LATE 12 4 UNITS FOR GI BLEED  . Chronic kidney disease     STONES  . Hypertension   . Arthritis   . Diabetes mellitus     OFF JANUVIA AND HUMALOG SINCE NOV  . Anemia   . 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 Kidney stone   . CAD (coronary artery disease)   . Memory loss   . Diverticulosis     diverticulitis with rectal bleeding 2012  . Rheumatoid arthritis   . Atrial flutter     on xarelto    Past Surgical History  Procedure Laterality Date  . Coronary angioplasty    . Abdominal hysterectomy    . Hip arthroplasty  BIL  . Knee arthroplasty      LFT  . Eye surgery      CAT IOL LFT  . Decompressive lumbar laminectomy level 1  01/06/12  . Implantable loop recorder placement  10/2013    MDT LINQ implanted by Dr Johney Frame for cryptogenic stroke  . Loop recorder implant Left 11/06/2013    Procedure: LOOP RECORDER IMPLANT;  Surgeon: Gardiner Rhyme, MD;  Location: [facility] CATH LAB;  Service: Cardiovascular;  Laterality: Left;      Social History History  Substance Use Topics  . Smoking status: Never Smoker   . Smokeless tobacco: Not on file  . Alcohol Use: No   patient lives at home with her husband. She is independent with her ADLs. She occasionally uses a cane. She still drives.  Family History Family History  Problem Relation Age of Onset  . Heart disease Mother   . Heart disease Father   . Diabetes      father  . Other      atherosclerosis mother's side   she denies known family history of colon cancer or intestinal bleeding.  Prior to Admission medications   Medication Sig Start Date End Date Taking? Authorizing Provider  albuterol (PROVENTIL HFA;VENTOLIN HFA) 108 (90 BASE) MCG/ACT inhaler Inhale 2 puffs into the lungs every 6 (six) hours as needed. Shortness of breath    Yes Historical Provider, MD  brimonidine (ALPHAGAN) 0.15 % ophthalmic  solution Place 1 drop into both eyes 3 (three) times daily.    Yes Historical Provider, MD  donepezil (ARICEPT) 10 MG tablet Take 10 mg by mouth at bedtime.  11/01/13  Yes Historical Provider, MD  folic acid (FOLVITE) 1 MG tablet Take 1 tablet by mouth daily. 03/12/14  Yes Historical Provider, MD  IRON PO Take 1 capsule by mouth daily.   Yes Historical Provider, MD  leflunomide (ARAVA) 10 MG tablet Take 10 mg by mouth daily. 07/26/15  Yes Historical Provider, MD  LUMIGAN 0.01 % SOLN Place 1 drop into both eyes at bedtime.  11/03/14  Yes Historical Provider, MD  metFORMIN (GLUCOPHAGE) 500 MG tablet Take 500 mg by mouth 2 (two) times daily with a meal.   Yes Historical Provider, MD  metoprolol succinate (TOPROL-XL) 50 MG 24 hr tablet Take 1 tablet (50 mg total) by mouth daily. Take with or immediately following a meal. 07/08/14  Yes Hillis Range, MD  nitroGLYCERIN (NITROSTAT) 0.4 MG SL tablet Place 0.4 mg under the tongue every 5 (five) minutes as needed for chest pain. x3 doses as needed for chest pain   Yes Historical Provider, MD  oxybutynin (DITROPAN-XL) 5 MG 24 hr tablet Take 5 mg by mouth daily. 07/26/15  Yes Historical Provider, MD  sitaGLIPtin (JANUVIA) 100 MG tablet Take 100 mg by mouth daily.   Yes Historical Provider, MD  telmisartan-hydrochlorothiazide (MICARDIS HCT) 40-12.5 MG per tablet Take 1 tablet by mouth daily. 05/11/15  Yes Hillis Range, MD  XARELTO 15 MG TABS tablet TAKE ONE TABLET BY MOUTH ONCE DAILY WITH SUPPER 05/11/15  Yes Hillis Range, MD    Allergies  Allergen Reactions  . Codeine Nausea Only and Other (See Comments)    REACTION: GI upset    Physical Exam  Vitals  Blood pressure 131/73, pulse 62, temperature 98.6 F (37 C), temperature source Oral, resp. rate 18, SpO2 100 %.   General: Pleasant, thin, elderly female, lying in bed in NAD, husband bedside  Psych:  Normal affect and insight, Not Suicidal or  Homicidal, Awake Alert, Oriented X 3.  Neuro:   No F.N deficits,  ALL C.Nerves Intact, Strength 5/5 all 4 extremities, Sensation intact all 4 extremities.  ENT:  Ears and Eyes appear Normal, Conjunctivae clear, PER. Moist oral mucosa without erythema or exudates.  Neck:  Supple, No lymphadenopathy appreciated  Respiratory:  Symmetrical chest wall movement, Good air movement bilaterally, CTAB.  Cardiac:  RRR, No Murmurs, no LE edema noted, no JVD.    Abdomen:  Positive bowel sounds, Soft, Non tender, Non distended,  No masses appreciated  Skin:  No Cyanosis, Normal Skin Turgor, No Skin Rash or Bruise.  Extremities:  Able to move all 4. 5/5 strength in each,  no effusions.  Data Review  CBC  Recent Labs Lab 08/02/15 1145  WBC 5.0  HGB 9.8*  HCT 29.5*  PLT 198  MCV 85.8  MCH 28.5  MCHC 33.2  RDW 14.0  LYMPHSABS 1.7  MONOABS 0.4  EOSABS 0.3  BASOSABS 0.0    Chemistries   Recent Labs Lab 08/02/15 1145  NA 142  K 4.3  CL 108  CO2 26  GLUCOSE 116*  BUN 18  CREATININE 1.55*  CALCIUM 9.4  AST 22  ALT 13*  ALKPHOS 67  BILITOT 0.7     Imaging results:   No results found.  My personal review of EKG: EKG pending    Assessment & Plan  Principal Problem:   GI bleed Active Problems:   DM (diabetes mellitus)   Essential hypertension   Diverticulosis   Coronary atherosclerosis of native coronary artery   Atrial flutter   CKD (chronic kidney disease) stage 3, GFR 30-59 ml/min    Lower GI bleed Painless, likely diverticular. Exacerbated by Xarelto. Will hold Xarelto. Will monitor and manage conservatively. Place on clear liquid diet. cycle CBCs. [facility] gastroenterology consultation. Per Dr. Evette Cristal, if she continues to bleed the next step would be a nuclear medicine RBC scan. Need to determine when it will be safe to restart Xarelto.  Diabetes mellitus Sliding scale insulin sensitive. Check hemoglobin A1c  Essential hypertension Appears stable. Continue home medications.  A flutter Controlled with  metoprolol. Hold Xarelto  Rheumatoid arthritis Continue Arava  Mild dementia Continue Aricept  Chronic kidney disease Creatinine at baseline  Consultants Called:  [facility], Dr. Evette Cristal  Family Communication:   Husband bedside  Code Status:  Full code (please verify with patient)  Condition:  Guarded  Potential Disposition: To home in 24-48 hours when she stops bleeding and can tolerate a solid diet.  Time spent in minutes : [address]  PA-C on 08/02/2015 at 5:44 PM Between 7am to 7pm - Pager - [phone] After 7pm go to [email or web address] - password [email or web address] And look for the night coverage person covering me after hours  [facility]

## 2015-08-02 NOTE — ED Notes (Signed)
Pt up to bathroom with assistance. Vital signs stable. No signs of distress noted. Pt updated on plan of care for admission. Family at bedside.

## 2015-08-02 NOTE — Progress Notes (Signed)
Loop recorder 

## 2015-08-02 NOTE — ED Notes (Signed)
Pt here c/o rectal bleeding that is red in nature; pt denies pain but sts takes xerelto

## 2015-08-02 NOTE — ED Provider Notes (Signed)
CSN: [id number]     Arrival date & time 08/02/15  1051 History   First MD Initiated Contact with Patient 08/02/15 1442     Chief Complaint  Patient presents with  . Rectal Bleeding     Patient is a 79 y.o. female presenting with hematochezia. The history is provided by the patient. No language interpreter was used.  Rectal Bleeding  [name] presents for evaluation of hematochezia. She was in her routine state of health when this morning around 4 AM she developed hematochezia described as dark and bright red blood mixed with some brown stool. She denies any fevers, chest pain, shortness of breath, abdominal pain, fatigue. She takes Xarelto for history of stroke. Symptoms are moderate and constant.  Past Medical History  Diagnosis Date  . Complication of anesthesia     OCC HALLUCINATIONS  . S/P angioplasty     10/12  . Angina     INFREG  . Asthma     NO PROBLEMS RECENTLY  . Shortness of breath   . Sleep apnea     06  . GI bleed     12  4 UNITS OF BLOOD  . Blood transfusion     LATE 12 4 UNITS FOR GI BLEED  . Chronic kidney disease     STONES  . Hypertension   . Arthritis   . Diabetes mellitus     OFF JANUVIA AND HUMALOG SINCE NOV  . Anemia   . 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 Kidney stone   . CAD (coronary artery disease)   . Memory loss   . Diverticulosis     diverticulitis with rectal bleeding 2012  . Rheumatoid arthritis   . Atrial flutter     on xarelto   Past Surgical History  Procedure Laterality Date  . Coronary angioplasty    . Abdominal hysterectomy    . Hip arthroplasty      BIL  . Knee arthroplasty      LFT  . Eye surgery      CAT IOL LFT  . Decompressive lumbar laminectomy level 1  01/06/12  . Implantable loop recorder placement  10/2013    MDT LINQ implanted by Dr Johney Frame for cryptogenic stroke  . Loop recorder implant Left 11/06/2013    Procedure: LOOP RECORDER IMPLANT;  Surgeon: Gardiner Rhyme, MD;  Location: [facility] CATH LAB;   Service: Cardiovascular;  Laterality: Left;   Family History  Problem Relation Age of Onset  . Heart disease Mother   . Heart disease Father   . Diabetes      father  . Other      atherosclerosis mother's side   History  Substance Use Topics  . Smoking status: Never Smoker   . Smokeless tobacco: Not on file  . Alcohol Use: No   OB History    No data available     Review of Systems  Gastrointestinal: Positive for hematochezia.  All other systems reviewed and are negative.     Allergies  Codeine  Home Medications   Prior to Admission medications   Medication Sig Start Date End Date Taking? Authorizing Provider  ADVOCATE REDI-CODE test strip  02/17/14   Historical Provider, MD  albuterol (PROVENTIL HFA;VENTOLIN HFA) 108 (90 BASE) MCG/ACT inhaler Inhale 2 puffs into the lungs every 6 (six) hours as needed. Shortness of breath     Historical Provider, MD  Blood Glucose Calibration (ADVOCATE REDI-CODE+ CONTROL) LOW  SOLN  02/17/14   Historical Provider, MD  brimonidine (ALPHAGAN) 0.15 % ophthalmic solution Place 1 drop into both eyes 3 (three) times daily.     Historical Provider, MD  donepezil (ARICEPT) 10 MG tablet Take 10 mg by mouth at bedtime.  11/01/13   Historical Provider, MD  folic acid (FOLVITE) 1 MG tablet Take 1 tablet by mouth daily. 03/12/14   Historical Provider, MD  IRON PO Take 1 capsule by mouth daily.    Historical Provider, MD  Lancet Devices (SIMPLE DIAGNOSTICS LANCING DEV) MISC  02/17/14   Historical Provider, MD  lidocaine (LIDODERM) 5 % Place 1 patch onto the skin daily as needed (pain).  11/28/13   Historical Provider, MD  LUMIGAN 0.01 % SOLN Place 1 drop into both eyes at bedtime.  11/03/14   Historical Provider, MD  metFORMIN (GLUCOPHAGE) 500 MG tablet Take 500 mg by mouth 2 (two) times daily with a meal.    Historical Provider, MD  metoprolol succinate (TOPROL-XL) 50 MG 24 hr tablet Take 1 tablet (50 mg total) by mouth daily. Take with or immediately  following a meal. 07/08/14   Hillis Range, MD  nitroGLYCERIN (NITROSTAT) 0.4 MG SL tablet Place 0.4 mg under the tongue every 5 (five) minutes as needed for chest pain. x3 doses as needed for chest pain    Historical Provider, MD  simvastatin (ZOCOR) 80 MG tablet Take 1 tablet (80 mg total) by mouth at bedtime. 11/07/13   Drema Dallas, MD  sitaGLIPtin (JANUVIA) 100 MG tablet Take 100 mg by mouth daily.    Historical Provider, MD  telmisartan-hydrochlorothiazide (MICARDIS HCT) 40-12.5 MG per tablet Take 1 tablet by mouth daily. 05/11/15   Hillis Range, MD  XARELTO 15 MG TABS tablet TAKE ONE TABLET BY MOUTH ONCE DAILY WITH SUPPER 05/11/15   Hillis Range, MD   BP 130/79 mmHg  Pulse 76  Temp(Src) 98.6 F (37 C) (Oral)  Resp 18  SpO2 100% Physical Exam  Constitutional: She is oriented to person, place, and time. She appears well-developed and well-nourished.  HENT:  Head: Normocephalic and atraumatic.  Cardiovascular: Normal rate and regular rhythm.   No murmur heard. Pulmonary/Chest: Effort normal and breath sounds normal. No respiratory distress.  Abdominal: Soft. There is no tenderness. There is no rebound and no guarding.  Genitourinary:  nontender rectal exam, small amount of gross blood, no stool  Musculoskeletal: She exhibits no edema or tenderness.  Neurological: She is alert and oriented to person, place, and time.  Skin: Skin is warm and dry.  Psychiatric: She has a normal mood and affect. Her behavior is normal.  Nursing note and vitals reviewed.   ED Course  Procedures (including critical care time) Labs Review Labs Reviewed  COMPREHENSIVE METABOLIC PANEL - Abnormal; Notable for the following:    Glucose, Bld 116 (*)    Creatinine, Ser 1.55 (*)    ALT 13 (*)    GFR calc non Af Amer 31 (*)    GFR calc Af Amer 36 (*)    All other components within normal limits  CBC WITH DIFFERENTIAL/PLATELET - Abnormal; Notable for the following:    RBC 3.44 (*)    Hemoglobin 9.8 (*)     HCT 29.5 (*)    Eosinophils Relative 6 (*)    All other components within normal limits  TYPE AND SCREEN    Imaging Review No results found.   EKG Interpretation None      MDM   Final diagnoses:  Hematochezia    Patient here for evaluation of hematochezia, uncertain relative for stroke prevention. Patient is in no acute distress in the emergency department and hemodynamically stable. Rectal exam demonstrates some bright red blood. CBC was stable anemia compared to prior. Discussed with hospitalist service, plan to admit for further observation given rectal bleeding on anticoagulants.  Tilden Fossa, MD 08/02/15 1600

## 2015-08-02 NOTE — ED Notes (Signed)
GI at bedside

## 2015-08-02 NOTE — Consult Note (Signed)
Subjective:   HPI  The patient is a 79 year old female who presented to the emergency room with complaints of rectal bleeding. She first experienced this around 4:00 this morning when she had a bowel movement which she states was brown but she saw blood around it. A little later she had a lot of blood per rectum. She denies abdominal pain. She otherwise feels fine at this time. In 2009 she had a colonoscopy done by Dr. Bosie Clos who found pandiverticulosis and internal hemorrhoids. In 2011 she had an EGD which showed a web in the lower third of the esophagus but otherwise negative exam. She denies hematemesis. She is on Xarelto.  Review of Systems Denies chest pain or shortness of breath  Past Medical History  Diagnosis Date  . Complication of anesthesia     OCC HALLUCINATIONS  . S/P angioplasty     10/12  . Angina     INFREG  . Asthma     NO PROBLEMS RECENTLY  . Shortness of breath   . Sleep apnea     06  . GI bleed     12  4 UNITS OF BLOOD  . Blood transfusion     LATE 12 4 UNITS FOR GI BLEED  . Chronic kidney disease     STONES  . Hypertension   . Arthritis   . Diabetes mellitus     OFF JANUVIA AND HUMALOG SINCE NOV  . Anemia   . 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 Kidney stone   . CAD (coronary artery disease)   . Memory loss   . Diverticulosis     diverticulitis with rectal bleeding 2012  . Rheumatoid arthritis   . Atrial flutter     on xarelto   Past Surgical History  Procedure Laterality Date  . Coronary angioplasty    . Abdominal hysterectomy    . Hip arthroplasty      BIL  . Knee arthroplasty      LFT  . Eye surgery      CAT IOL LFT  . Decompressive lumbar laminectomy level 1  01/06/12  . Implantable loop recorder placement  10/2013    MDT LINQ implanted by Dr Johney Frame for cryptogenic stroke  . Loop recorder implant Left 11/06/2013    Procedure: LOOP RECORDER IMPLANT;  Surgeon: Gardiner Rhyme, MD;  Location: [facility] CATH LAB;  Service:  Cardiovascular;  Laterality: Left;   History   Social History  . Marital Status: Divorced    Spouse Name: N/A  . Number of Children: 3  . Years of Education: college   Occupational History  . retired    Social History Main Topics  . Smoking status: Never Smoker   . Smokeless tobacco: Not on file  . Alcohol Use: No  . Drug Use: No  . Sexual Activity: Yes    Birth Control/ Protection: Post-menopausal   Other Topics Concern  . Not on file   Social History Narrative   Lives in a house no stairs other than front porch.  Lives alone.  She uses a cane occasionally.  Wears glasses.     family history includes Diabetes in an other family member; Heart disease in her father and mother; Other in an other family member. No current facility-administered medications for this encounter.  Current outpatient prescriptions:  .  albuterol (PROVENTIL HFA;VENTOLIN HFA) 108 (90 BASE) MCG/ACT inhaler, Inhale 2 puffs into the lungs every 6 (six) hours as needed.  Shortness of breath , Disp: , Rfl:  .  brimonidine (ALPHAGAN) 0.15 % ophthalmic solution, Place 1 drop into both eyes 3 (three) times daily. , Disp: , Rfl:  .  donepezil (ARICEPT) 10 MG tablet, Take 10 mg by mouth at bedtime. , Disp: , Rfl:  .  folic acid (FOLVITE) 1 MG tablet, Take 1 tablet by mouth daily., Disp: , Rfl:  .  IRON PO, Take 1 capsule by mouth daily., Disp: , Rfl:  .  leflunomide (ARAVA) 10 MG tablet, Take 10 mg by mouth daily., Disp: , Rfl:  .  LUMIGAN 0.01 % SOLN, Place 1 drop into both eyes at bedtime. , Disp: , Rfl:  .  metFORMIN (GLUCOPHAGE) 500 MG tablet, Take 500 mg by mouth 2 (two) times daily with a meal., Disp: , Rfl:  .  metoprolol succinate (TOPROL-XL) 50 MG 24 hr tablet, Take 1 tablet (50 mg total) by mouth daily. Take with or immediately following a meal., Disp: 90 tablet, Rfl: 3 .  nitroGLYCERIN (NITROSTAT) 0.4 MG SL tablet, Place 0.4 mg under the tongue every 5 (five) minutes as needed for chest pain. x3 doses as  needed for chest pain, Disp: , Rfl:  .  oxybutynin (DITROPAN-XL) 5 MG 24 hr tablet, Take 5 mg by mouth daily., Disp: , Rfl:  .  sitaGLIPtin (JANUVIA) 100 MG tablet, Take 100 mg by mouth daily., Disp: , Rfl:  .  telmisartan-hydrochlorothiazide (MICARDIS HCT) 40-12.5 MG per tablet, Take 1 tablet by mouth daily., Disp: 30 tablet, Rfl: 6 .  XARELTO 15 MG TABS tablet, TAKE ONE TABLET BY MOUTH ONCE DAILY WITH SUPPER, Disp: 30 tablet, Rfl: 6 .  simvastatin (ZOCOR) 80 MG tablet, Take 1 tablet (80 mg total) by mouth at bedtime., Disp: 30 tablet, Rfl: 0 Allergies  Allergen Reactions  . Codeine Nausea Only and Other (See Comments)    REACTION: GI upset     Objective:     BP 131/73 mmHg  Pulse 62  Temp(Src) 98.6 F (37 C) (Oral)  Resp 18  SpO2 100%  She is in no distress  Nonicteric  Heart regular rhythm no murmurs  Lungs clear  Abdomen: Bowel sounds normal, soft, nontender  Laboratory No components found for: D1    Assessment:     Lower GI bleed. This is most likely of diverticular origin.      Plan:     Patient is being admitted to the hospital. Follow H&H. Stop Xarelto for now. Transfuse blood as needed. Most likely GI bleeds from diverticular origins will stop spontaneously. If bleeding continues the next step would be to do a nuclear medicine GI bleeding scan and if positive proceed with embolization per interventional radiology. We will follow clinically with you. Lab Results  Component Value Date   HGB 9.8* 08/02/2015   HGB 9.5* 11/18/2014   HGB 12.0 11/04/2013   HCT 29.5* 08/02/2015   HCT 29.7* 11/18/2014   HCT 35.5* 11/04/2013   ALKPHOS 67 08/02/2015   ALKPHOS 118* 11/03/2013   ALKPHOS 96 12/23/2012   AST 22 08/02/2015   AST 20 11/03/2013   AST 17 12/23/2012   ALT 13* 08/02/2015   ALT 13 11/03/2013   ALT 12 12/23/2012

## 2015-08-03 DIAGNOSIS — K922 Gastrointestinal hemorrhage, unspecified: Secondary | ICD-10-CM

## 2015-08-03 DIAGNOSIS — I483 Typical atrial flutter: Secondary | ICD-10-CM

## 2015-08-03 DIAGNOSIS — N183 Chronic kidney disease, stage 3 (moderate): Secondary | ICD-10-CM

## 2015-08-03 DIAGNOSIS — K5731 Diverticulosis of large intestine without perforation or abscess with bleeding: Secondary | ICD-10-CM

## 2015-08-03 LAB — GLUCOSE, CAPILLARY
GLUCOSE-CAPILLARY: 80 mg/dL (ref 65–99)
GLUCOSE-CAPILLARY: 130 mg/dL — AB (ref 65–99)

## 2015-08-03 LAB — CBC
HCT: 26.3 % — ABNORMAL LOW (ref 36.0–46.0)
Hemoglobin: 8.8 g/dL — ABNORMAL LOW (ref 12.0–15.0)
MCH: 28 pg (ref 26.0–34.0)
MCHC: 33.5 g/dL (ref 30.0–36.0)
MCV: 83.8 fL (ref 78.0–100.0)
Platelets: 185 10*3/uL (ref 150–400)
RBC: 3.14 MIL/uL — ABNORMAL LOW (ref 3.87–5.11)
RDW: 13.9 % (ref 11.5–15.5)
WBC: 4.7 10*3/uL (ref 4.0–10.5)

## 2015-08-03 LAB — BASIC METABOLIC PANEL
Anion gap: 5 (ref 5–15)
BUN: 16 mg/dL (ref 6–20)
CALCIUM: 9 mg/dL (ref 8.9–10.3)
CHLORIDE: 109 mmol/L (ref 101–111)
CO2: 26 mmol/L (ref 22–32)
Creatinine, Ser: 1.28 mg/dL — ABNORMAL HIGH (ref 0.44–1.00)
GFR calc Af Amer: 46 mL/min — ABNORMAL LOW (ref >60)
GFR calc non Af Amer: 40 mL/min — ABNORMAL LOW (ref >60)
GLUCOSE: 90 mg/dL (ref 65–99)
Potassium: 4 mmol/L (ref 3.5–5.1)
Sodium: 140 mmol/L (ref 135–145)

## 2015-08-03 NOTE — Discharge Summary (Addendum)
Physician Discharge Summary  [name] URK:[id number] DOB: [birth year] DOA: 08/02/2015  PCP: Willey Blade, MD  Admit date: 08/02/2015 Discharge date: 08/03/2015  Recommendations for Outpatient Follow-up:  1. Follow-up with gastroenterology within 2 weeks or sooner if needed 2. F/u with PCP in 1 week and if bleeding has stopped, consider resuming xarelto.    Discharge Diagnoses:  Principal Problem:   GI bleed Active Problems:   DM (diabetes mellitus)   Essential hypertension   Diverticulosis   Coronary atherosclerosis of native coronary artery   Atrial flutter   CKD (chronic kidney disease) stage 3, GFR 30-59 ml/min   Lower GI bleed   Discharge Condition: Stable, improved  Diet recommendation: Diabetic/healthy heart  Wt Readings from Last 3 Encounters:  08/02/15 55.5 kg (122 lb 5.7 oz)  11/18/14 60.328 kg (133 lb)  06/02/14 64.411 kg (142 lb)    History of present illness:  The patient is a 7 rolled female with history of rheumatoid arthritis, atrial flutter on Xarelto, diabetes mellitus type 2, mild dementia who presented to the emergency department with hematochezia. She reported blood which was dark mixed in with stool over the 12 hours prior to admission. She had 2 episodes. She denied NSAID use. She had a colonoscopy in which some polyps removed but however she reported none were cancerous. She does have history of diverticulosis.  Hospital Course:   Probable diverticular bleed with acute blood loss anemia exacerbated by Xarelto. Her Xarelto was held and she was started on a clear liquid diet. Her hemoglobin drifted down slightly to 8.8 mg/dL and remained stable thereafter. She had no further bleeding. She was seen by gastroenterology who recommended conservative management and observation.  She was advised to hold her Xarelto until she followed up with her primary care doctor in approximately 1 week. If she is not having further bleeding, could consider resuming Xarelto at  that time.  We discussed the possibility of transitioning to an alternative agent, however she felt comfortable with Xarelto at this time but will talk about it with her primary care doctor.  Diabetes mellitus type 2, treated with sliding scale insulin.  Hypertension, blood pressure remained stable at home medications.  Atrial flutter, she appeared to be in sinus rhythm on telemetry. -  CHADs2vasc = 5  -  HASBLED = 4 -  Xarelto has been discontinued temporarily until she can follow-up with her primary care doctor. If she has not had further bleeding, could consider resuming in approximately 1 week  Rheumatoid arthritis, stable Continued Arava  Mild dementia, stable Continued Aricept  Chronic kidney disease Creatinine at baseline  Procedures:  None  Consultations:  Gastroenterology  Discharge Exam: Filed Vitals:   08/03/15 0748  BP: 156/78  Pulse: 69  Temp: 97.8 F (36.6 C)  Resp: 18   Filed Vitals:   08/02/15 1838 08/02/15 2054 08/03/15 0545 08/03/15 0748  BP: 134/91 134/68 151/91 156/78  Pulse: 49 73 75 69  Temp: 98.2 F (36.8 C) 98 F (36.7 C) 98.6 F (37 C) 97.8 F (36.6 C)  TempSrc: Oral Oral Oral Oral  Resp: 18 18 17 18   Height: 5' (1.524 m)     Weight: 55.5 kg (122 lb 5.7 oz)     SpO2: 99% 100% 100% 100%    General: Thin adult female, no acute distress Cardiovascular: Regular rate and rhythm, no murmurs, rubs, or gallops Respiratory: Clear to auscultation bilaterally, no increased work of breathing Abdomen: NABS, soft, nondistended, nontender MSK: Normal tone and bulk,  no lower extremity edema  Discharge Instructions      Discharge Instructions    Call MD for:  difficulty breathing, headache or visual disturbances    Complete by:  As directed      Call MD for:  extreme fatigue    Complete by:  As directed      Call MD for:  hives    Complete by:  As directed      Call MD for:  persistant dizziness or light-headedness    Complete by:  As  directed      Call MD for:  persistant nausea and vomiting    Complete by:  As directed      Call MD for:  severe uncontrolled pain    Complete by:  As directed      Call MD for:  temperature >100.4    Complete by:  As directed      Diet - low sodium heart healthy    Complete by:  As directed      Diet Carb Modified    Complete by:  As directed      Discharge instructions    Complete by:  As directed   You were hospitalized with rectal bleeding.  You may continue to have some dark blood in your stools but in decreasing quantities for the next few days.  If your bleeding increases or if you have fainting spells, difficulty breathint, please return to the hospital right away by calling 911.     Increase activity slowly    Complete by:  As directed             Medication List    STOP taking these medications        XARELTO 15 MG Tabs tablet  Generic drug:  Rivaroxaban      TAKE these medications        albuterol 108 (90 BASE) MCG/ACT inhaler  Commonly known as:  PROVENTIL HFA;VENTOLIN HFA  Inhale 2 puffs into the lungs every 6 (six) hours as needed. Shortness of breath     brimonidine 0.15 % ophthalmic solution  Commonly known as:  ALPHAGAN  Place 1 drop into both eyes 3 (three) times daily.     donepezil 10 MG tablet  Commonly known as:  ARICEPT  Take 10 mg by mouth at bedtime.     folic acid 1 MG tablet  Commonly known as:  FOLVITE  Take 1 tablet by mouth daily.     IRON PO  Take 1 capsule by mouth daily.     leflunomide 10 MG tablet  Commonly known as:  ARAVA  Take 10 mg by mouth daily.     LUMIGAN 0.01 % Soln  Generic drug:  bimatoprost  Place 1 drop into both eyes at bedtime.     metFORMIN 500 MG tablet  Commonly known as:  GLUCOPHAGE  Take 500 mg by mouth 2 (two) times daily with a meal.     metoprolol succinate 50 MG 24 hr tablet  Commonly known as:  TOPROL-XL  Take 1 tablet (50 mg total) by mouth daily. Take with or immediately following a meal.      nitroGLYCERIN 0.4 MG SL tablet  Commonly known as:  NITROSTAT  Place 0.4 mg under the tongue every 5 (five) minutes as needed for chest pain. x3 doses as needed for chest pain     oxybutynin 5 MG 24 hr tablet  Commonly known as:  DITROPAN-XL  Take 5 mg  by mouth daily.     sitaGLIPtin 100 MG tablet  Commonly known as:  JANUVIA  Take 100 mg by mouth daily.     telmisartan-hydrochlorothiazide 40-12.5 MG per tablet  Commonly known as:  MICARDIS HCT  Take 1 tablet by mouth daily.       Follow-up Information    Follow up with August Saucer, ERIC, MD. Schedule an appointment as soon as possible for a visit in 1 week.   Specialty:  Internal Medicine   Contact information:   [address] [place] [address] [phone]       Follow up with Gwenevere Abbot, MD. Schedule an appointment as soon as possible for a visit in 1 month.   Specialty:  Gastroenterology   Contact information:   [address] [place] [address] [phone]        The results of significant diagnostics from this hospitalization (including imaging, microbiology, ancillary and laboratory) are listed below for reference.    Significant Diagnostic Studies: No results found.  Microbiology: No results found for this or any previous visit (from the past 240 hour(s)).   Labs: Basic Metabolic Panel:  Recent Labs Lab 08/02/15 1145 08/03/15 0222  NA 142 140  K 4.3 4.0  CL 108 109  CO2 26 26  GLUCOSE 116* 90  BUN 18 16  CREATININE 1.55* 1.28*  CALCIUM 9.4 9.0   Liver Function Tests:  Recent Labs Lab 08/02/15 1145  AST 22  ALT 13*  ALKPHOS 67  BILITOT 0.7  PROT 7.1  ALBUMIN 3.7   No results for input(s): LIPASE, AMYLASE in the last 168 hours. No results for input(s): AMMONIA in the last 168 hours. CBC:  Recent Labs Lab 08/02/15 1145 08/02/15 2042 08/03/15 0222  WBC 5.0 5.3 4.7  NEUTROABS 2.5  --   --   HGB 9.8* 8.7* 8.8*  HCT 29.5* 25.9* 26.3*  MCV 85.8 83.8  83.8  PLT 198 191 185   Cardiac Enzymes: No results for input(s): CKTOTAL, CKMB, CKMBINDEX, TROPONINI in the last 168 hours. BNP: BNP (last 3 results) No results for input(s): BNP in the last 8760 hours.  ProBNP (last 3 results) No results for input(s): PROBNP in the last 8760 hours.  CBG:  Recent Labs Lab 08/02/15 1842 08/02/15 2211 08/03/15 0749 08/03/15 1153  GLUCAP 99 126* 80 130*    Time coordinating discharge: 25-minutes  Signed:  Lucynda Rosano  [facility] 08/03/2015, 1:59 PM

## 2015-08-03 NOTE — Discharge Instructions (Signed)
Bloody Stools °Bloody stools means there is blood in your poop (stool). It is a sign that there is a problem somewhere in the digestive system. It is important for your doctor to find the cause of your bleeding, so the problem can be treated.  °HOME CARE °· Only take medicine as told by your doctor. °· Eat foods with fiber (prunes, bran cereals). °· Drink enough fluids to keep your pee (urine) clear or pale yellow. °· Sit in warm water (sitz bath) for 10 to 15 minutes as told by your doctor. °· Know how to take your medicines (enemas, suppositories) if advised by your doctor. °· Watch for signs that you are getting better or getting worse. °GET HELP RIGHT AWAY IF:  °· You are not getting better. °· You start to get better but then get worse again. °· You have new problems. °· You have severe bleeding from the place where poop comes out (rectum) that does not stop. °· You throw up (vomit) blood. °· You feel weak or pass out (faint). °· You have a fever. °MAKE SURE YOU:  °· Understand these instructions. °· Will watch your condition. °· Will get help right away if you are not doing well or get worse. °Document Released: 11/29/2009 Document Revised: 03/04/2012 Document Reviewed: 04/28/2011 °[employer]® Patient Information ©2015 [employer]. This information is not intended to replace advice given to you by your health care provider. Make sure you discuss any questions you have with your health care provider. ° °

## 2015-08-03 NOTE — Progress Notes (Signed)
[facility] Progress Note  Subjective: No further signs of gastrointestinal bleeding per patient she feels fine and is hungry.  Objective: Vital signs in last 24 hours: Temp:  [97.8 F (36.6 C)-98.6 F (37 C)] 97.8 F (36.6 C) (08/09 0748) Pulse Rate:  [49-77] 69 (08/09 0748) Resp:  [17-18] 18 (08/09 0748) BP: (119-156)/(68-91) 156/78 mmHg (08/09 0748) SpO2:  [97 %-100 %] 100 % (08/09 0748) Weight:  [55.5 kg (122 lb 5.7 oz)] 55.5 kg (122 lb 5.7 oz) (08/08 1838) Weight change:    PE:  No distress  Heart regular rhythm  Lungs clear  Abdomen soft nontender  Lab Results: Results for orders placed or performed during the hospital encounter of 08/02/15 (from the past 24 hour(s))  Comprehensive metabolic panel     Status: Abnormal   Collection Time: 08/02/15 11:45 AM  Result Value Ref Range   Sodium 142 135 - 145 mmol/L   Potassium 4.3 3.5 - 5.1 mmol/L   Chloride 108 101 - 111 mmol/L   CO2 26 22 - 32 mmol/L   Glucose, Bld 116 (H) 65 - 99 mg/dL   BUN 18 6 - 20 mg/dL   Creatinine, Ser 4.09 (H) 0.44 - 1.00 mg/dL   Calcium 9.4 8.9 - 73.5 mg/dL   Total Protein 7.1 6.5 - 8.1 g/dL   Albumin 3.7 3.5 - 5.0 g/dL   AST 22 15 - 41 U/L   ALT 13 (L) 14 - 54 U/L   Alkaline Phosphatase 67 38 - 126 U/L   Total Bilirubin 0.7 0.3 - 1.2 mg/dL   GFR calc non Af Amer 31 (L) >60 mL/min   GFR calc Af Amer 36 (L) >60 mL/min   Anion gap 8 5 - 15  Type and screen     Status: None   Collection Time: 08/02/15 11:45 AM  Result Value Ref Range   ABO/RH(D) O POS    Antibody Screen NEG    Sample Expiration 08/05/2015   CBC with Differential     Status: Abnormal   Collection Time: 08/02/15 11:45 AM  Result Value Ref Range   WBC 5.0 4.0 - 10.5 K/uL   RBC 3.44 (L) 3.87 - 5.11 MIL/uL   Hemoglobin 9.8 (L) 12.0 - 15.0 g/dL   HCT 32.9 (L) 92.4 - 26.8 %   MCV 85.8 78.0 - 100.0 fL   MCH 28.5 26.0 - 34.0 pg   MCHC 33.2 30.0 - 36.0 g/dL   RDW 34.1 96.2 - 22.9 %   Platelets 198 150 - 400  K/uL   Neutrophils Relative % 51 43 - 77 %   Neutro Abs 2.5 1.7 - 7.7 K/uL   Lymphocytes Relative 34 12 - 46 %   Lymphs Abs 1.7 0.7 - 4.0 K/uL   Monocytes Relative 8 3 - 12 %   Monocytes Absolute 0.4 0.1 - 1.0 K/uL   Eosinophils Relative 6 (H) 0 - 5 %   Eosinophils Absolute 0.3 0.0 - 0.7 K/uL   Basophils Relative 1 0 - 1 %   Basophils Absolute 0.0 0.0 - 0.1 K/uL  Glucose, capillary     Status: None   Collection Time: 08/02/15  6:42 PM  Result Value Ref Range   Glucose-Capillary 99 65 - 99 mg/dL  CBC     Status: Abnormal   Collection Time: 08/02/15  8:42 PM  Result Value Ref Range   WBC 5.3 4.0 - 10.5 K/uL   RBC 3.09 (L) 3.87 - 5.11 MIL/uL   Hemoglobin 8.7 (  L) 12.0 - 15.0 g/dL   HCT 67.6 (L) 19.5 - 09.3 %   MCV 83.8 78.0 - 100.0 fL   MCH 28.2 26.0 - 34.0 pg   MCHC 33.6 30.0 - 36.0 g/dL   RDW 26.7 12.4 - 58.0 %   Platelets 191 150 - 400 K/uL  Glucose, capillary     Status: Abnormal   Collection Time: 08/02/15 10:11 PM  Result Value Ref Range   Glucose-Capillary 126 (H) 65 - 99 mg/dL  CBC     Status: Abnormal   Collection Time: 08/03/15  2:22 AM  Result Value Ref Range   WBC 4.7 4.0 - 10.5 K/uL   RBC 3.14 (L) 3.87 - 5.11 MIL/uL   Hemoglobin 8.8 (L) 12.0 - 15.0 g/dL   HCT 99.8 (L) 33.8 - 25.0 %   MCV 83.8 78.0 - 100.0 fL   MCH 28.0 26.0 - 34.0 pg   MCHC 33.5 30.0 - 36.0 g/dL   RDW 53.9 76.7 - 34.1 %   Platelets 185 150 - 400 K/uL  Basic metabolic panel     Status: Abnormal   Collection Time: 08/03/15  2:22 AM  Result Value Ref Range   Sodium 140 135 - 145 mmol/L   Potassium 4.0 3.5 - 5.1 mmol/L   Chloride 109 101 - 111 mmol/L   CO2 26 22 - 32 mmol/L   Glucose, Bld 90 65 - 99 mg/dL   BUN 16 6 - 20 mg/dL   Creatinine, Ser 9.37 (H) 0.44 - 1.00 mg/dL   Calcium 9.0 8.9 - 90.2 mg/dL   GFR calc non Af Amer 40 (L) >60 mL/min   GFR calc Af Amer 46 (L) >60 mL/min   Anion gap 5 5 - 15  Glucose, capillary     Status: None   Collection Time: 08/03/15  7:49 AM  Result Value  Ref Range   Glucose-Capillary 80 65 - 99 mg/dL    Studies/Results: No results found.    Assessment: Rectal bleeding most likely diverticular  Plan:   Continue observation. No intervention planned from a GI standpoint at this time. If major bleeding occurs would do nuclear medicine GI bleeding scan and if positive get IR involved for embolization. We will sign off. Call if needed.    Gwenevere Abbot 08/03/2015, 10:17 AM  Pager: [phone] If no answer or after 5 PM call [phone] Lab Results  Component Value Date   HGB 8.8* 08/03/2015   HGB 8.7* 08/02/2015   HGB 9.8* 08/02/2015   HCT 26.3* 08/03/2015   HCT 25.9* 08/02/2015   HCT 29.5* 08/02/2015   ALKPHOS 67 08/02/2015   ALKPHOS 118* 11/03/2013   ALKPHOS 96 12/23/2012   AST 22 08/02/2015   AST 20 11/03/2013   AST 17 12/23/2012   ALT 13* 08/02/2015   ALT 13 11/03/2013   ALT 12 12/23/2012

## 2015-08-03 NOTE — Evaluation (Signed)
Physical Therapy Evaluation Patient Details Name: [name] MRN: [id number] DOB: 1936-05-23 Today's Date: 08/03/2015   History of Present Illness  79 y.o. female with h/o RA, atrial flutter (on Xarelto), mild dementia, DM, CKD, B THA, L TKA, back surgery admitted with lower GIB.   Clinical Impression  Pt is independent with mobility with her cane. She walked 220' with her cane without loss of balance. She appears to be at baseline with mobility. No further PT indicated, PT signing off.     Follow Up Recommendations No PT follow up    Equipment Recommendations  None recommended by PT    Recommendations for Other Services       Precautions / Restrictions Precautions Precautions: None Precaution Comments: pt reports no falls in past year Restrictions Weight Bearing Restrictions: No      Mobility  Bed Mobility Overal bed mobility: Independent                Transfers Overall transfer level: Independent                  Ambulation/Gait Ambulation/Gait assistance: Modified independent (Device/Increase time) Ambulation Distance (Feet): 220 Feet Assistive device: Straight cane Gait Pattern/deviations: Decreased stride length;WFL(Within Functional Limits) Gait velocity: WFL   General Gait Details: steady with pt's SPC, adjusted cane height, no LOB  Stairs            Wheelchair Mobility    Modified Rankin (Stroke Patients Only)       Balance Overall balance assessment: Modified Independent                                           Pertinent Vitals/Pain Pain Assessment: No/denies pain    Home Living Family/patient expects to be discharged to:: Private residence Living Arrangements: Children (son and grandchidren live with her) Available Help at Discharge: Family;Available PRN/intermittently Type of Home: House Home Access: Stairs to enter Entrance Stairs-Rails: Right Entrance Stairs-Number of Steps: 2 Home Layout: One  level Home Equipment: Walker - 2 wheels;Cane - single point;Shower seat      Prior Function Level of Independence: Independent with assistive device(s)         Comments: uses SPC when going out of home     Hand Dominance   Dominant Hand: Right    Extremity/Trunk Assessment   Upper Extremity Assessment: Overall WFL for tasks assessed           Lower Extremity Assessment: Overall WFL for tasks assessed      Cervical / Trunk Assessment: Normal  Communication   Communication: No difficulties  Cognition Arousal/Alertness: Awake/alert Behavior During Therapy: WFL for tasks assessed/performed Overall Cognitive Status: Within Functional Limits for tasks assessed                      General Comments      Exercises        Assessment/Plan    PT Assessment Patent does not need any further PT services  PT Diagnosis     PT Problem List    PT Treatment Interventions     PT Goals (Current goals can be found in the Care Plan section) Acute Rehab PT Goals Patient Stated Goal: to return home PT Goal Formulation: All assessment and education complete, DC therapy    Frequency     Barriers to discharge  Co-evaluation               End of Session Equipment Utilized During Treatment: Gait belt Activity Tolerance: Patient tolerated treatment well;No increased pain Patient left: in chair;with call bell/phone within reach Nurse Communication: Mobility status         Time: 0865-7846 PT Time Calculation (min) (ACUTE ONLY): 17 min   Charges:   PT Evaluation $Initial PT Evaluation Tier I: 1 Procedure     PT G CodesTamala Ser 08/03/2015, 9:23 AM [phone]

## 2015-08-05 ENCOUNTER — Ambulatory Visit (INDEPENDENT_AMBULATORY_CARE_PROVIDER_SITE_OTHER): Payer: Medicare PPO | Admitting: Nurse Practitioner

## 2015-08-05 ENCOUNTER — Encounter: Payer: Self-pay | Admitting: Nurse Practitioner

## 2015-08-05 VITALS — BP 119/76 | HR 73 | Ht 60.0 in | Wt 125.2 lb

## 2015-08-05 DIAGNOSIS — I10 Essential (primary) hypertension: Secondary | ICD-10-CM | POA: Diagnosis not present

## 2015-08-05 DIAGNOSIS — I679 Cerebrovascular disease, unspecified: Secondary | ICD-10-CM | POA: Diagnosis not present

## 2015-08-05 DIAGNOSIS — I639 Cerebral infarction, unspecified: Secondary | ICD-10-CM

## 2015-08-05 NOTE — Progress Notes (Signed)
I have reviewed and agreed above plan. 

## 2015-08-05 NOTE — Patient Instructions (Addendum)
Strict control of diabetes with hemoglobin A1c goal below 6.5% Hypertension her blood pressure goal below 130/90  119/76 was today's reading Lipids with LDL cholesterol goal below 70 percent.  Continue Aricept for her memory loss. F/U with Dr. August Saucer in 1 week per hospital instructions F/U in 2 weeks with Dr. Wilson Singer from our service

## 2015-08-05 NOTE — Progress Notes (Signed)
[facility]  PATIENT: [name] DOB: 07/22/1936   REASON FOR VISIT: Follow-up for history of stroke November 2014 , hyperlipidemia, hypertension, diabetes, history of atrial flutter HISTORY FROM: Patient    HISTORY OF PRESENT ILLNESS:77 year lady with admission to [facility] on 11/03/13 for stroke seen today for first office followup visit. She presented with difficulty following commands and having significant problems speaking and being off balance. Due to delay in arrival she was not a candidate for TPA. CT scan of head was unremarkable but MRI scan showed multiple acute infarcts scattered along the left hemisphere in a watershed distribution but MRA of the brain and carotid Dopplers did not reveal significant extra or intracranial stenosis. Transthoracic echo showed normal ejection fraction without cardiac source of embolism. TEE did not show any left atrial clot or PFO. She had a loop recorder placed for monitoring for paroxysmal atrial fibrillation. LDL cholesterol was elevated at 141 and she was started on Zocor. HEENT her malignancy was 6.5. She was started on clopidogrel for secondary stroke prevention. She states she has done well since discharge her speech has improved to back to normal. She for unclear reasons his not taking any antiplatelets. She is taking Aricept for memory loss which is for several years and she has noticed that her short-term memory has gotten worse since her stroke. She states her blood pressure is well controlled with a slightly elevated at 154/95 in office today. She is independent in activities of daily living but is just bothered by recent short-term memory difficulties. She has finished home and outpatient therapies.   UPDATE 04/08/14 (LL): [name] comes to the office for stroke follow up. She states she is feeling well with no lasting deficits from her stroke. She states her blood pressure is well controlled with a slightly elevated at  148/80 in office today. She had a loop recorder placed in the hospital which has not found any atrial fibrillation. She feels her memory loss is stable, she has word-finding difficulties but eventually does think of what she wants to say. She is driving and is independently taking care of her own affairs. She is tolerating daily aspirin well with significant bruising or bleeding. UPDATE 08/05/2015 [name], 79 year old female returns for follow-up. She had a stroke 11/03/2013 .She has not had further stroke or TIA symptoms. She was at admitted to the hospital on 08/02/2015  for GI bleed. She was taken off Xarelto at that time was to follow-up with her primary care in 1 week from discharge to discuss resuming this medication. She is independent in her activities of daily living. Her son and 2 grandchildren live with her. She continues to drive  without difficulty. She is alone at today's visit. She returns for reevaluation  REVIEW OF SYSTEMS: Full 14 system review of systems performed and notable only for those listed, all others are neg:  Constitutional: neg  Cardiovascular: neg Ear/Nose/Throat: Hearing loss  Skin: neg Eyes: neg Respiratory: neg Gastroitestinal: Urinary frequency Hematology/Lymphatic: neg  Endocrine: neg Musculoskeletal: Arthritis Allergy/Immunology: neg Neurological: neg Psychiatric: neg Sleep : neg   ALLERGIES: Allergies  Allergen Reactions  . Codeine Nausea Only and Other (See Comments)    REACTION: GI upset    HOME MEDICATIONS: Outpatient Prescriptions Prior to Visit  Medication Sig Dispense Refill  . albuterol (PROVENTIL HFA;VENTOLIN HFA) 108 (90 BASE) MCG/ACT inhaler Inhale 2 puffs into the lungs every 6 (six) hours as needed. Shortness of breath     . brimonidine (  ALPHAGAN) 0.15 % ophthalmic solution Place 1 drop into both eyes 3 (three) times daily.     Marland Kitchen donepezil (ARICEPT) 10 MG tablet Take 10 mg by mouth at bedtime.     . folic acid (FOLVITE) 1 MG  tablet Take 1 tablet by mouth daily.    . IRON PO Take 1 capsule by mouth daily.    Marland Kitchen leflunomide (ARAVA) 10 MG tablet Take 10 mg by mouth daily.    Marland Kitchen LUMIGAN 0.01 % SOLN Place 1 drop into both eyes at bedtime.     . metFORMIN (GLUCOPHAGE) 500 MG tablet Take 500 mg by mouth 2 (two) times daily with a meal.    . metoprolol succinate (TOPROL-XL) 50 MG 24 hr tablet Take 1 tablet (50 mg total) by mouth daily. Take with or immediately following a meal. 90 tablet 3  . nitroGLYCERIN (NITROSTAT) 0.4 MG SL tablet Place 0.4 mg under the tongue every 5 (five) minutes as needed for chest pain. x3 doses as needed for chest pain    . oxybutynin (DITROPAN-XL) 5 MG 24 hr tablet Take 5 mg by mouth daily.    . sitaGLIPtin (JANUVIA) 100 MG tablet Take 100 mg by mouth daily.    Marland Kitchen telmisartan-hydrochlorothiazide (MICARDIS HCT) 40-12.5 MG per tablet Take 1 tablet by mouth daily. 30 tablet 6   No facility-administered medications prior to visit.    PAST MEDICAL HISTORY: Past Medical History  Diagnosis Date  . Complication of anesthesia     OCC HALLUCINATIONS  . S/P angioplasty     10/12  . Angina     INFREG  . Asthma     NO PROBLEMS RECENTLY  . Shortness of breath   . Sleep apnea     06  . GI bleed     12  4 UNITS OF BLOOD  . Blood transfusion     LATE 12 4 UNITS FOR GI BLEED  . Chronic kidney disease     STONES  . Hypertension   . Arthritis   . Diabetes mellitus     OFF JANUVIA AND HUMALOG SINCE NOV  . Anemia   . 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 Kidney stone   . CAD (coronary artery disease)   . Memory loss   . Diverticulosis     diverticulitis with rectal bleeding 2012  . Rheumatoid arthritis   . Atrial flutter     on xarelto    PAST SURGICAL HISTORY: Past Surgical History  Procedure Laterality Date  . Coronary angioplasty    . Abdominal hysterectomy    . Hip arthroplasty      BIL  . Knee arthroplasty      LFT  . Eye surgery      CAT IOL LFT  . Decompressive  lumbar laminectomy level 1  01/06/12  . Implantable loop recorder placement  10/2013    MDT LINQ implanted by Dr Johney Frame for cryptogenic stroke  . Loop recorder implant Left 11/06/2013    Procedure: LOOP RECORDER IMPLANT;  Surgeon: Gardiner Rhyme, MD;  Location[facility] CATH LAB;  Service: Cardiovascular;  Laterality: Left;    FAMILY HISTORY: Family History  Problem Relation Age of Onset  . Heart disease Mother   . Heart disease Father   . Diabetes      father  . Other      atherosclerosis mother's side    SOCIAL HISTORY: Social History   Social History  . Marital  Status: Divorced    Spouse Name: N/A  . Number of Children: 3  . Years of Education: college   Occupational History  . retired    Social History Main Topics  . Smoking status: Never Smoker   . Smokeless tobacco: Not on file  . Alcohol Use: No  . Drug Use: No  . Sexual Activity: Yes    Birth Control/ Protection: Post-menopausal   Other Topics Concern  . Not on file   Social History Narrative   Lives in a house no stairs other than front porch.  Lives alone.  She uses a cane occasionally.  Wears glasses.       PHYSICAL EXAM  Filed Vitals:   08/05/15 1400  BP: 119/76  Pulse: 73  Height: 5' (1.524 m)  Weight: 125 lb 3.2 oz (56.79 kg)   Body mass index is 24.45 kg/(m^2). General: well developed, well nourished, seated, in no evident distress  Head: head normocephalic and atraumatic. Orohparynx benign  Neck: supple with no carotid or supraclavicular bruits  Cardiovascular: regular rate and rhythm, no murmurs or rubs Musculoskeletal: no deformity  Skin: no rash/petichiae    Neurologic Exam  Mental Status: Awake and fully alert. Oriented to place and time.  Mood and affect appropriate.  Cranial Nerves: Fundoscopic exam not done. Pupils equal, briskly reactive to light. Extraocular movements full without nystagmus. Visual fields full to confrontation. Hearing intact. Facial sensation intact. Face,  tongue, palate moves normally and symmetrically.  Motor: Normal bulk and tone. Normal strength in all tested extremity muscles.  Sensory.: intact to touch and pinprick and vibratory sensation.  Coordination: Rapid alternating movements normal in all extremities. Finger-to-nose and heel-to-shin performed accurately bilaterally.  Gait and Station: Arises from chair without difficulty. Stance is normal. Gait demonstrates normal stride length and balance .Able to heel, toe and tandem walk without difficulty. No assistive device Reflexes: 1+ and symmetric.  DIAGNOSTIC DATA (LABS, IMAGING, TESTING) - I reviewed patient records, labs, notes, testing and imaging myself where available.  Lab Results  Component Value Date   WBC 4.7 08/03/2015   HGB 8.8* 08/03/2015   HCT 26.3* 08/03/2015   MCV 83.8 08/03/2015   PLT 185 08/03/2015      Component Value Date/Time   NA 140 08/03/2015 0222   NA 143 12/23/2012   K 4.0 08/03/2015 0222   CL 109 08/03/2015 0222   CO2 26 08/03/2015 0222   GLUCOSE 90 08/03/2015 0222   BUN 16 08/03/2015 0222   BUN 9 12/23/2012   CREATININE 1.28* 08/03/2015 0222   CREATININE 1.1 12/23/2012   CALCIUM 9.0 08/03/2015 0222   PROT 7.1 08/02/2015 1145   ALBUMIN 3.7 08/02/2015 1145   AST 22 08/02/2015 1145   ALT 13* 08/02/2015 1145   ALKPHOS 67 08/02/2015 1145   BILITOT 0.7 08/02/2015 1145   GFRNONAA 40* 08/03/2015 0222   GFRAA 46* 08/03/2015 0222    ASSESSMENT AND PLAN  79 y.o. year old female  has a past medical history of embolic left frontal MCA infarcts with without definite identified etiology. Multiple vascular risk factors of hypertension, hyperlipidemia, diabetes and coronary artery disease.  and Atrial flutter. Recent GI bleed .She has not had further stroke or TIA symptoms since her original stroke in 2014 The patient is a current patient of Dr. Pearlean Brownie  who is out of the office today . This note is sent to the work in doctor.     Important to manage risk  factors as follows.  Strict  control of diabetes with hemoglobin A1c goal below 6.5% Hypertension her blood pressure goal below 130/90  119/76 was today's reading Lipids with LDL cholesterol goal below 70 percent.  F/U with Dr. August Saucer in 1 week per hospital instructions to discuss Xarelto around 08/11/15 F/U in 2 weeks with Dr. Evette Cristal If  Recurrent symptoms of stroke occur call 911 and proceed to the hospital Dismissed from our service Nilda Riggs, [facility], [facility], APRN  [facility] [address] [place], [place] [address] [phone]

## 2015-08-06 LAB — CUP PACEART REMOTE DEVICE CHECK: Date Time Interrogation Session: 20160812162709

## 2015-08-13 ENCOUNTER — Encounter: Payer: Self-pay | Admitting: Internal Medicine

## 2015-08-20 ENCOUNTER — Ambulatory Visit (INDEPENDENT_AMBULATORY_CARE_PROVIDER_SITE_OTHER): Payer: Medicare PPO | Admitting: *Deleted

## 2015-08-20 DIAGNOSIS — I639 Cerebral infarction, unspecified: Secondary | ICD-10-CM | POA: Diagnosis not present

## 2015-08-25 NOTE — Progress Notes (Signed)
Loop recorder 

## 2015-09-03 ENCOUNTER — Telehealth: Payer: Self-pay | Admitting: *Deleted

## 2015-09-03 LAB — CUP PACEART REMOTE DEVICE CHECK: Date Time Interrogation Session: 20160909113444

## 2015-09-03 NOTE — Telephone Encounter (Signed)
Called patient regarding tachy episode on LINQ transmission from 08/29/15.  Patient reports that she does not recall experiencing any symptoms.  She states that she had a fall this afternoon leaving a store because she forgot her cane and landed on the concrete.  She states that she had tripped over the curb and that she was able to get up with the assistance of some bystanders.  She denies hitting her head or experiencing dizziness at the time of the fall and is not experiencing any pain or discomfort at this time.  She states that she has an alert button if she falls at home and is unable to get up or if she needs help.  Advised patient to call her primary doctor during office hours with any symptoms post-fall, or to call emergency services if she experiences any symptoms over the weekend.  Patient verbalizes understanding of all instructions and denies questions or concerns at this time.

## 2015-09-03 NOTE — Progress Notes (Signed)
Carelink summary report received. Battery status OK. Normal device function. No new symptom episodes, tachy episodes, brady, or pause episodes. No new AF episodes, +Xarelto. Monthly summary reports and ROV with JA on 11/19/2015 at 3:00pm.

## 2015-09-20 ENCOUNTER — Ambulatory Visit (INDEPENDENT_AMBULATORY_CARE_PROVIDER_SITE_OTHER): Payer: Medicare PPO | Admitting: *Deleted

## 2015-09-20 DIAGNOSIS — I639 Cerebral infarction, unspecified: Secondary | ICD-10-CM

## 2015-09-23 NOTE — Progress Notes (Signed)
Loop recorder 

## 2015-09-27 ENCOUNTER — Encounter: Payer: Self-pay | Admitting: Internal Medicine

## 2015-09-30 LAB — CUP PACEART REMOTE DEVICE CHECK: MDC IDC SESS DTM: 20160927013711

## 2015-09-30 NOTE — Progress Notes (Signed)
Carelink summary report received. Battery status OK. Normal device function. No new symptom, brady, pause, or AF episodes. 1 tachy episode, SVT, duration 43sec, peak V 182bpm, patient asymptomatic. Monthly summary reports and ROV with JA on 11/19/15 at 3:00pm.

## 2015-10-05 ENCOUNTER — Telehealth: Payer: Self-pay | Admitting: *Deleted

## 2015-10-05 NOTE — Telephone Encounter (Signed)
Called patient regarding tachy episodes on LINQ transmission from 10/03/15 and 10/04/15.  Patient asymptomatic with both episodes.  Patient appreciative of call and denies additional questions or concerns at this time.

## 2015-10-15 ENCOUNTER — Encounter: Payer: Self-pay | Admitting: Internal Medicine

## 2015-10-20 ENCOUNTER — Ambulatory Visit (INDEPENDENT_AMBULATORY_CARE_PROVIDER_SITE_OTHER): Payer: Medicare PPO | Admitting: *Deleted

## 2015-10-20 DIAGNOSIS — I639 Cerebral infarction, unspecified: Secondary | ICD-10-CM | POA: Diagnosis not present

## 2015-10-21 NOTE — Progress Notes (Signed)
Loop recorder 

## 2015-11-05 ENCOUNTER — Encounter: Payer: Self-pay | Admitting: Internal Medicine

## 2015-11-17 ENCOUNTER — Encounter: Payer: Self-pay | Admitting: Internal Medicine

## 2015-11-19 ENCOUNTER — Encounter: Payer: Medicare PPO | Admitting: Internal Medicine

## 2015-11-19 LAB — CUP PACEART REMOTE DEVICE CHECK: MDC IDC SESS DTM: 20161027020541

## 2015-11-19 NOTE — Progress Notes (Signed)
Carelink summary report received. Battery status OK. Normal device function. No new symptom, brady, pause, or AF episodes. 9 tachy episodes, available EGMs suggest SVT, longest 40sec, peak V 158bpm, patient asymptomatic. Monthly summary reports and ROV with JA in 10/2015 (recall letter sent).

## 2015-11-22 ENCOUNTER — Ambulatory Visit (INDEPENDENT_AMBULATORY_CARE_PROVIDER_SITE_OTHER): Payer: Medicare PPO | Admitting: *Deleted

## 2015-11-22 DIAGNOSIS — I639 Cerebral infarction, unspecified: Secondary | ICD-10-CM | POA: Diagnosis not present

## 2015-11-24 NOTE — Progress Notes (Signed)
[id number] 

## 2015-12-16 ENCOUNTER — Encounter: Payer: Self-pay | Admitting: Internal Medicine

## 2015-12-16 ENCOUNTER — Encounter: Payer: Self-pay | Admitting: Cardiology

## 2015-12-21 ENCOUNTER — Ambulatory Visit (INDEPENDENT_AMBULATORY_CARE_PROVIDER_SITE_OTHER): Payer: Medicare PPO | Admitting: *Deleted

## 2015-12-21 DIAGNOSIS — I639 Cerebral infarction, unspecified: Secondary | ICD-10-CM

## 2015-12-21 NOTE — Progress Notes (Signed)
Carelink Summary Report / Loop Recorder 

## 2015-12-23 ENCOUNTER — Encounter: Payer: Self-pay | Admitting: Cardiology

## 2015-12-30 ENCOUNTER — Encounter: Payer: Self-pay | Admitting: Cardiology

## 2016-01-03 LAB — CUP PACEART REMOTE DEVICE CHECK: MDC IDC SESS DTM: 20161126020830

## 2016-01-05 ENCOUNTER — Encounter: Payer: Self-pay | Admitting: Cardiology

## 2016-01-14 ENCOUNTER — Encounter: Payer: Self-pay | Admitting: Cardiology

## 2016-01-18 ENCOUNTER — Encounter: Payer: Medicare Other | Admitting: *Deleted

## 2016-01-20 ENCOUNTER — Encounter: Payer: Self-pay | Admitting: Internal Medicine

## 2016-01-21 ENCOUNTER — Encounter: Payer: Self-pay | Admitting: Cardiology

## 2016-01-26 ENCOUNTER — Encounter: Payer: Self-pay | Admitting: Cardiology

## 2016-01-29 LAB — CUP PACEART REMOTE DEVICE CHECK: Date Time Interrogation Session: 20161226020829

## 2016-02-01 NOTE — Progress Notes (Signed)
Cardiology Office Note Date:  02/03/2016  Patient ID:  [name], [name] 31-Jul-1936, MRN [id number] PCP:  Gwenyth Bender, MD  Cardiologist:  Dr. Sharyn Lull Electrophysiologist: Dr. Johney Frame Neurology: Dr. Terrace Arabia Gastroenterology: Dr. Evette Cristal   Chief Complaint: Linq battery status evaluation  History of Present Illness: [name] is a 80 y.o. female with history of cryptogenic stroke with ILR that eventually discovered PAFlutter and was started on a/c with xarelto.  Otherwise her PMHx is of CAD (angioplasty Oct. 2012), HTN, HLD, anemia/GIB requiring transfusion in 2012, 2016 without transfusion, and RA on therapy.  She was last seen by EP, Dr. Johney Frame Nov 2015, at that time doing well, was being evaluated by her PMD for anemia.  August 2016 she was at [facility] with LGIB, treated conservatively by GI service, felt most likely to have been diverticular in etiology her xarelto was held with a low Hgb of 8.7, d/c summary reported recommendation to f/u 1 week with PMD and if no further bleeding to resume a/c.  She had a trip/fall accident 09/03/15 denied trauma, she denies this as a recurrent problem.  She comes today with a wheelchair, says she is without her knee brace and without it her ambulation is quite painful.    ILR 10/31/15 transmission noted SVT episode , asymptomatic  She comes to the office today to be seen for Dr. Johney Frame, secondary to her ILR battery status.  She denies any kind of CP or SOB, occasionally her sinuses bother her only.  No dizziness, near syncope or syncope.  She will infrequently feel some palpitations/fast heart beats that self resolve and do not bother her, and she denies any associated symptoms with them.  She denies any recurrent bleeding.  The patient reports herself as having a poor memory, her son takes care of her medicines and she is not sure of her current regime.  We called her son with her permission to discuss her medicines and current plan for her care  regarding anticoagulation, though unable to reach him.  The patient states she saw her PMD about a month ago, and recalls having labs done as well.  We were able to obtain a list from her PMD office.      Past Medical History  Diagnosis Date  . Complication of anesthesia     OCC HALLUCINATIONS  . S/P angioplasty     10/12  . Angina     INFREG  . Asthma     NO PROBLEMS RECENTLY  . Shortness of breath   . Sleep apnea     06  . GI bleed     12  4 UNITS OF BLOOD  . Blood transfusion     LATE 12 4 UNITS FOR GI BLEED  . Chronic kidney disease     STONES  . Hypertension   . Arthritis   . Diabetes mellitus     OFF JANUVIA AND HUMALOG SINCE NOV  . Anemia   . 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 Kidney stone   . CAD (coronary artery disease)   . Memory loss   . Diverticulosis     diverticulitis with rectal bleeding 2012  . Rheumatoid arthritis (HCC)   . Atrial flutter (HCC)     on xarelto    Past Surgical History  Procedure Laterality Date  . Coronary angioplasty    . Abdominal hysterectomy    . Hip arthroplasty      BIL  .  Knee arthroplasty      LFT  . Eye surgery      CAT IOL LFT  . Decompressive lumbar laminectomy level 1  01/06/12  . Implantable loop recorder placement  10/2013    MDT LINQ implanted by Dr Johney Frame for cryptogenic stroke  . Loop recorder implant Left 11/06/2013    Procedure: LOOP RECORDER IMPLANT;  Surgeon: Gardiner Rhyme, MD;  Location: [facility] CATH LAB;  Service: Cardiovascular;  Laterality: Left;    Current Outpatient Prescriptions  Medication Sig Dispense Refill  . atorvastatin (LIPITOR) 20 MG tablet Take 20 mg by mouth daily.    . brimonidine (ALPHAGAN) 0.15 % ophthalmic solution Place 1 drop into both eyes 3 (three) times daily.     Marland Kitchen donepezil (ARICEPT) 10 MG tablet Take 10 mg by mouth at bedtime.     . IRON PO Take 1 capsule by mouth daily.    Marland Kitchen LUMIGAN 0.01 % SOLN Place 1 drop into both eyes at bedtime.     . Memantine HCl-Donepezil  HCl (NAMZARIC PO) Take by mouth. Take 1wk 7mg   2 wks 14 mg 3wks 21mg  4wks 28mg     . metFORMIN (GLUCOPHAGE) 500 MG tablet Take 500 mg by mouth 2 (two) times daily with a meal.    . nitroGLYCERIN (NITROSTAT) 0.4 MG SL tablet Place 0.4 mg under the tongue every 5 (five) minutes as needed for chest pain. x3 doses as needed for chest pain    . oxybutynin (DITROPAN-XL) 5 MG 24 hr tablet Take 5 mg by mouth daily.    . sitaGLIPtin (JANUVIA) 100 MG tablet Take 100 mg by mouth daily.    telmisartan-hydrochlorothiazide (MICARDIS HCT) 40-12.5 MG per tablet Take 1 tablet by mouth daily. 30 tablet 6   No current facility-administered medications for this visit.    Allergies:   Codeine   Social History:  The patient  reports that she has never smoked. She does not have any smokeless tobacco history on file. She reports that she does not drink alcohol or use illicit drugs.   Family History:  The patient's family history includes Heart disease in her father and mother.  ROS:  Please see the history of present illness.    All other systems are reviewed and otherwise negative.   PHYSICAL EXAM:  VS:  BP 108/64 mmHg  Pulse 76  Ht 5' (1.524 m)  Wt 116 lb (52.617 kg)  BMI 22.65 kg/m2 BMI: Body mass index is 22.65 kg/(m^2). Thin, frail appearing, in no acute distress HEENT: normocephalic, atraumatic Neck: no JVD, carotid bruits or masses Cardiac:  normal S1, S2; RRR; no significant murmurs, no rubs, or gallops Lungs:  clear to auscultation bilaterally, no wheezing, rhonchi or rales Abd: soft, nontender MS: no deformity age appropriateatrophy Ext: no edema Skin: warm and dry, no rash Neuro:  No gross deficits appreciated Psych: euthymic mood, full affect  ILR site is stable  EKG:  Done today shows SR, RBBB, LAD ILR interrogation today: She has had multiple episodes of tachycardia, felt to be SVT, longest 6:59min, the majority appear to be <73minute duration  11/04/13: Echocardiogram Study  Conclusions - Left ventricle: The cavity size was normal. There was severe concentric hypertrophy. Systolic function was normal. The estimated ejection fraction was in the range of 60% to 65%. Doppler parameters are consistent with abnormal left ventricular relaxation (grade 1 diastolic dysfunction). The E/e' ratio is >10, suggesting elevated LV filling pressure. - Mitral valve: Calcified annulus. Mildly thickened leaflets . Trivial  regurgitation. - Left atrium: The atrium was at the upper limits of normal in size. - Right ventricle: RV systolic pressure: 56mm Hg (S, est). - Pulmonary arteries: PA peak pressure: 52mm Hg (S). - Inferior vena cava: The vessel was normal in size; the respirophasic diameter changes were in the normal range (= 50%); findings are consistent with normal central venous pressure. - Pericardium, extracardiac: There was no pericardial effusion.  Recent Labs: 08/02/2015: ALT 13* 08/03/2015: BUN 16; Creatinine, Ser 1.28*; Hemoglobin 8.8*; Platelets 185; Potassium 4.0; Sodium 140  No results found for requested labs within last 365 days.   CrCl cannot be calculated (Patient has no serum creatinine result on file.).   Wt Readings from Last 3 Encounters:  02/02/16 116 lb (52.617 kg)  08/05/15 125 lb 3.2 oz (56.79 kg)  08/02/15 122 lb 5.7 oz (55.5 kg)     Other studies reviewed: Additional studies/records reviewed today include: summarized above  DEVICE information: MDT Linq ILR, 11/06/13 for stroke  ASSESSMENT AND PLAN:  1. PAFlutter     ILR battery is OK     CHADS2vasc     GIB in August, remains off Xarelto by her PMD med list     The patient says her memory is not so good, thinks she was told that she can no longer be on blood thinners.    2. Stroke     C/w neurology  3. CAD     no angina     On BB,  No statin,. Will defer to her cardiologist  4. GIB     GI f/u, she thinks she saw GI once post hospital stay, she is not  certain  5. SVT     asymptomatic  Disposition: We will reach out to her PMD office tomorrow when the regular staff is in to try to obtain additional information, last office note to identify what her anticoagulation recommendations and decisions have been since her hospital stay.  We have requested as well a note from GI.  She has some SVTs on her ILR, but not symptomatic with them, ,will review with Dr. Johney Frame further, no changes for now.  Will plan a 3 month f/u visit, pending further discussions and final recommendations once we have more information.   Judith Blonder, PA-C 02/03/2016 11:32 AM     [facility] [address] [place] [address] [phone] (office)  [phone] (fax)

## 2016-02-02 ENCOUNTER — Encounter: Payer: Self-pay | Admitting: Physician Assistant

## 2016-02-02 ENCOUNTER — Ambulatory Visit (INDEPENDENT_AMBULATORY_CARE_PROVIDER_SITE_OTHER): Payer: Medicare Other | Admitting: Physician Assistant

## 2016-02-02 VITALS — BP 108/64 | HR 76 | Ht 60.0 in | Wt 116.0 lb

## 2016-02-02 DIAGNOSIS — I454 Nonspecific intraventricular block: Secondary | ICD-10-CM | POA: Diagnosis not present

## 2016-02-02 DIAGNOSIS — I4892 Unspecified atrial flutter: Secondary | ICD-10-CM | POA: Diagnosis not present

## 2016-02-02 DIAGNOSIS — I639 Cerebral infarction, unspecified: Secondary | ICD-10-CM

## 2016-02-02 NOTE — Patient Instructions (Signed)
Medication Instructions:   Your physician recommends that you continue on your current medications as directed. Please refer to the Current Medication list given to you today.   If you need a refill on your cardiac medications before your next appointment, please call your pharmacy.  Labwork: NONE ORDER TODAY    Testing/Procedures:  NONE ORDER TODAY    Follow-Up: IN 3 MONTHS WITH RENEE   Any Other Special Instructions Will Be Listed Below (If Applicable).                                                                                                                                                   

## 2016-02-03 ENCOUNTER — Encounter: Payer: Self-pay | Admitting: Physician Assistant

## 2016-02-03 ENCOUNTER — Telehealth: Payer: Self-pay | Admitting: *Deleted

## 2016-02-03 NOTE — Telephone Encounter (Signed)
Spoke to son about medications

## 2016-02-11 ENCOUNTER — Encounter: Payer: Self-pay | Admitting: Internal Medicine

## 2016-02-15 ENCOUNTER — Encounter: Payer: Self-pay | Admitting: Internal Medicine

## 2016-02-17 ENCOUNTER — Ambulatory Visit (INDEPENDENT_AMBULATORY_CARE_PROVIDER_SITE_OTHER): Payer: Medicare Other | Admitting: *Deleted

## 2016-02-17 DIAGNOSIS — I639 Cerebral infarction, unspecified: Secondary | ICD-10-CM | POA: Diagnosis not present

## 2016-02-18 NOTE — Progress Notes (Signed)
Carelink Summary Report / Loop Recorder 

## 2016-02-22 ENCOUNTER — Encounter: Payer: Self-pay | Admitting: Internal Medicine

## 2016-02-22 ENCOUNTER — Other Ambulatory Visit: Payer: Self-pay | Admitting: Internal Medicine

## 2016-03-20 ENCOUNTER — Ambulatory Visit (INDEPENDENT_AMBULATORY_CARE_PROVIDER_SITE_OTHER): Payer: Medicare Other | Admitting: *Deleted

## 2016-03-20 DIAGNOSIS — I639 Cerebral infarction, unspecified: Secondary | ICD-10-CM

## 2016-03-20 NOTE — Progress Notes (Signed)
Carelink Summary Report / Loop Recorder 

## 2016-04-17 ENCOUNTER — Ambulatory Visit (INDEPENDENT_AMBULATORY_CARE_PROVIDER_SITE_OTHER): Payer: Medicare Other | Admitting: *Deleted

## 2016-04-17 DIAGNOSIS — I639 Cerebral infarction, unspecified: Secondary | ICD-10-CM | POA: Diagnosis not present

## 2016-04-18 NOTE — Progress Notes (Signed)
Carelink Summary Report / Loop Recorder 

## 2016-05-03 ENCOUNTER — Telehealth: Payer: Self-pay | Admitting: *Deleted

## 2016-05-03 NOTE — Telephone Encounter (Signed)
Spoke to pt to set up follow up appt.

## 2016-05-04 ENCOUNTER — Telehealth: Payer: Self-pay | Admitting: *Deleted

## 2016-05-04 NOTE — Telephone Encounter (Signed)
-----   Message from [facility], [place] sent at 05/03/2016  1:22 PM EDT ----- To make a decision regarding resuming her blood thinner medicine.  Thanks! Renee ----- Message -----    From: Oleta Mouse, CMA    Sent: 05/02/2016   8:26 AM      To: Sheilah Pigeon, PA-C  Yes. I will contact her son since he is the one who handles her medicines about the appt.   And if he asks the reason of this visit you would like me to tell him its an follow up for what exactly?  ----- Message -----    From: Sheilah Pigeon, PA-C    Sent: 05/01/2016   2:45 PM      To: Oleta Mouse, CMA  Edson Snowball, Have you been able to reach this patient?  She needs to be seen, Amber or I would be fine, or even if Dr. Johney Frame has a spot.  Have her bring in all her medicines with her please.  Thanks, Renee ----- Message -----    From: Sheilah Pigeon, PA-C    Sent: 03/30/2016   1:56 PM      To: Oleta Mouse, CMA  Please have this patient come in to be seen, if her son can accompany her that would be best to discuss her medicines since the patient told us her son manages them.  Please try to find out if she ever saw a GI specialist and if so try to get the note.  Thanks [employer]

## 2016-05-05 ENCOUNTER — Telehealth: Payer: Self-pay | Admitting: Cardiology

## 2016-05-05 LAB — CUP PACEART REMOTE DEVICE CHECK: Date Time Interrogation Session: 20170224023753

## 2016-05-05 NOTE — Progress Notes (Signed)
Carelink summary report received. Battery status OK. Normal device function. No new symptom episodes, brady, or pause episodes. No new AF episodes. 30 tachy- 4 with ECGs available, previously addressed. Monthly summary reports and ROV/PRN

## 2016-05-05 NOTE — Telephone Encounter (Signed)
Spoke w/ pt and requested that she send a manual transmission b/c her home monitor has not updated in at least 14 days. Pt stated that she no longer has her home monitor. Her son mailed it back to [employer].

## 2016-05-12 ENCOUNTER — Encounter: Payer: Self-pay | Admitting: Cardiology

## 2016-05-17 ENCOUNTER — Ambulatory Visit (INDEPENDENT_AMBULATORY_CARE_PROVIDER_SITE_OTHER): Payer: Medicare Other | Admitting: *Deleted

## 2016-05-17 DIAGNOSIS — I639 Cerebral infarction, unspecified: Secondary | ICD-10-CM

## 2016-05-18 NOTE — Progress Notes (Signed)
Carelink Summary Report / Loop Recorder 

## 2016-05-20 LAB — CUP PACEART REMOTE DEVICE CHECK: Date Time Interrogation Session: 20170326070430

## 2016-05-20 NOTE — Progress Notes (Signed)
Carelink summary report received. Battery status OK. Normal device function. No new symptom episodes, tachy episodes, brady, or pause episodes. No new AF episodes. Monthly summary reports and ROV/PRN 

## 2016-05-22 LAB — CUP PACEART REMOTE DEVICE CHECK: MDC IDC SESS DTM: 20170425033626

## 2016-05-22 NOTE — Progress Notes (Signed)
Carelink summary report received. Battery status OK. Normal device function. No new symptom episodes, brady, or pause episodes. No new AF episodes. 3 tachy episodes, SVT, known issue. Monthly summary reports and ROV/PRN

## 2016-06-16 ENCOUNTER — Encounter: Payer: Medicare Other | Admitting: *Deleted

## 2016-06-27 LAB — CUP PACEART REMOTE DEVICE CHECK: Date Time Interrogation Session: 20170525033813

## 2016-06-27 NOTE — Progress Notes (Signed)
Carelink summary report received. Battery status OK. Normal device function. No new symptom episodes, brady, or pause episodes. No new AF episodes. 2 tachy- 1 with EGM previously addressed. Monthly summary reports and ROV/PRN

## 2016-07-07 ENCOUNTER — Telehealth: Payer: Self-pay | Admitting: Cardiology

## 2016-07-07 NOTE — Telephone Encounter (Signed)
Spoke w/ pt and she informed me that she mailed her home monitor back. Unenrolled pt form carelink.

## 2016-07-07 NOTE — Telephone Encounter (Signed)
Spoke w/ pt about disconnected monitor. She is going to call back.

## 2016-07-11 ENCOUNTER — Telehealth: Payer: Self-pay | Admitting: Physician Assistant

## 2016-07-11 NOTE — Telephone Encounter (Signed)
I called the patient to follow up, having asked for her to be contacted to make a follow-up appointment to further discuss decisions regarding anticoagulation (preferably with her and her son).   The patient reported feeling well, with no complaints.  She reports not getting any calls to schedule an appointment that she recalls.  I assured her there was nothing new or anything worrisome going on, only that I wanted to follow up on the pending decision regarding her medication.  She mentioned to me though that she is not think she would not be inclined to take a blood thinner.  Asked if she would be agreeable to come back in and discuss further and if possible with her son given he helps manage her medicines.  She said that would be fine, I told her I would ask a scheduler for the office to give her a call to make an appointment.    Francis Dowse, PA-C

## 2016-07-12 ENCOUNTER — Telehealth: Payer: Self-pay | Admitting: *Deleted

## 2016-09-01 ENCOUNTER — Encounter (HOSPITAL_COMMUNITY): Payer: Self-pay | Admitting: Emergency Medicine

## 2016-09-01 ENCOUNTER — Emergency Department (HOSPITAL_COMMUNITY)
Admission: EM | Admit: 2016-09-01 | Discharge: 2016-09-01 | Disposition: A | Discharge status: Home / Self Care | Payer: Medicare Other | Attending: Emergency Medicine | Admitting: Emergency Medicine

## 2016-09-01 DIAGNOSIS — Z79899 Other long term (current) drug therapy: Secondary | ICD-10-CM | POA: Insufficient documentation

## 2016-09-01 DIAGNOSIS — Z955 Presence of coronary angioplasty implant and graft: Secondary | ICD-10-CM | POA: Insufficient documentation

## 2016-09-01 DIAGNOSIS — I129 Hypertensive chronic kidney disease with stage 1 through stage 4 chronic kidney disease, or unspecified chronic kidney disease: Secondary | ICD-10-CM | POA: Diagnosis not present

## 2016-09-01 DIAGNOSIS — Z7984 Long term (current) use of oral hypoglycemic drugs: Secondary | ICD-10-CM | POA: Insufficient documentation

## 2016-09-01 DIAGNOSIS — N390 Urinary tract infection, site not specified: Secondary | ICD-10-CM | POA: Diagnosis not present

## 2016-09-01 DIAGNOSIS — N183 Chronic kidney disease, stage 3 (moderate): Secondary | ICD-10-CM | POA: Diagnosis not present

## 2016-09-01 DIAGNOSIS — J45909 Unspecified asthma, uncomplicated: Secondary | ICD-10-CM | POA: Diagnosis not present

## 2016-09-01 DIAGNOSIS — I2510 Atherosclerotic heart disease of native coronary artery without angina pectoris: Secondary | ICD-10-CM | POA: Diagnosis not present

## 2016-09-01 DIAGNOSIS — E1122 Type 2 diabetes mellitus with diabetic chronic kidney disease: Secondary | ICD-10-CM | POA: Diagnosis not present

## 2016-09-01 DIAGNOSIS — R531 Weakness: Secondary | ICD-10-CM | POA: Diagnosis present

## 2016-09-01 LAB — URINALYSIS, ROUTINE W REFLEX MICROSCOPIC
BILIRUBIN URINE: NEGATIVE
Glucose, UA: NEGATIVE mg/dL
Hgb urine dipstick: NEGATIVE
KETONES UR: NEGATIVE mg/dL
Leukocytes, UA: NEGATIVE
NITRITE: POSITIVE — AB
PROTEIN: NEGATIVE mg/dL
Specific Gravity, Urine: 1.013 (ref 1.005–1.030)
pH: 7 (ref 5.0–8.0)

## 2016-09-01 LAB — BASIC METABOLIC PANEL
Anion gap: 8 (ref 5–15)
BUN: 17 mg/dL (ref 6–20)
CO2: 25 mmol/L (ref 22–32)
CREATININE: 1.5 mg/dL — AB (ref 0.44–1.00)
Calcium: 9.6 mg/dL (ref 8.9–10.3)
Chloride: 108 mmol/L (ref 101–111)
GFR calc Af Amer: 38 mL/min — ABNORMAL LOW (ref >60)
GFR calc non Af Amer: 32 mL/min — ABNORMAL LOW (ref >60)
GLUCOSE: 83 mg/dL (ref 65–99)
Potassium: 4.5 mmol/L (ref 3.5–5.1)
Sodium: 141 mmol/L (ref 135–145)

## 2016-09-01 LAB — CBC
HCT: 37.7 % (ref 36.0–46.0)
Hemoglobin: 12.9 g/dL (ref 12.0–15.0)
MCH: 27 pg (ref 26.0–34.0)
MCHC: 34.2 g/dL (ref 30.0–36.0)
MCV: 79 fL (ref 78.0–100.0)
PLATELETS: 204 10*3/uL (ref 150–400)
RBC: 4.77 MIL/uL (ref 3.87–5.11)
RDW: 16.3 % — ABNORMAL HIGH (ref 11.5–15.5)
WBC: 3.6 10*3/uL — ABNORMAL LOW (ref 4.0–10.5)

## 2016-09-01 LAB — URINE MICROSCOPIC-ADD ON

## 2016-09-01 LAB — CBG MONITORING, ED: GLUCOSE-CAPILLARY: 80 mg/dL (ref 65–99)

## 2016-09-01 MED ORDER — SODIUM CHLORIDE 0.9 % IV BOLUS (SEPSIS)
500.0000 mL | Freq: Once | INTRAVENOUS | Status: AC
  Administered 2016-09-01: 500 mL via INTRAVENOUS

## 2016-09-01 MED ORDER — CEPHALEXIN 500 MG PO CAPS: 500.0000 mg | ORAL_CAPSULE | Freq: Two times a day (BID) | ORAL | 0 refills | Status: DC

## 2016-09-01 NOTE — ED Triage Notes (Signed)
Pt complaint weakness and decreased post emesis 9/2. Pt denies emesis since 9/2.

## 2016-09-01 NOTE — ED Notes (Signed)
Attempted IV access x 2 attempts both unsuccessful.

## 2016-09-01 NOTE — ED Notes (Signed)
Patient unable to urinate at this time.  Will try again after IV fluids run.

## 2016-09-01 NOTE — ED Provider Notes (Signed)
[facility]-EMERGENCY DEPT Provider Note   CSN: [id number] Arrival date & time: 09/01/16  1044     History   Chief Complaint Chief Complaint  Patient presents with  . Weakness  . Emesis    HPI [name] is a 80 y.o. female.  HPI  [name] is a 80 y.o. female with PMH significant for anemia, CAD, atrial flutter, CKD, diverticulosis with LGIB requiring transfusion, HTN, and RA who presents with 1 week of gradual onset, constant, unchanging generalized weakness.  Son states she had 2 episodes of NBNB emesis last week, but none since.  She denies fever, chills, cough, CP, SOB, HA, slurred speech, facial droop, abdominal pain, N/V/D, hematochezia or melena, or urinary symptoms.  Has had improving appetite over the last week.  No sick contacts.  She is no longer anticoagulated with Xarelto.  Son states that last week she did miss 3 days of her medications.  Normally gets around with wheelchair and walker.   Past Medical History:  Diagnosis Date  . Anemia   . Angina    INFREG  . Arthritis   . Asthma    NO PROBLEMS RECENTLY  . Atrial flutter (HCC)    on xarelto  . Blood transfusion    LATE 12 4 UNITS FOR GI BLEED  . CAD (coronary artery disease)   . Chronic kidney disease    STONES  . Complication of anesthesia    OCC HALLUCINATIONS  . Diabetes mellitus    OFF JANUVIA AND HUMALOG SINCE NOV  . Diverticulosis    diverticulitis with rectal bleeding 2012  . GI bleed    12  4 UNITS OF BLOOD  . 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 Hypertension   . Kidney stone   . Memory loss   . Rheumatoid arthritis (HCC)   . S/P angioplasty    10/12  . Shortness of breath   . Sleep apnea    06    Patient Active Problem List   Diagnosis Date Noted  . Diverticulosis of colon with hemorrhage 08/02/2015  . CKD (chronic kidney disease) stage 3, GFR 30-59 ml/min 08/02/2015  . Atrial flutter (HCC) 11/18/2014  . Cerebrovascular small vessel disease 04/08/2014  . Dementia  01/20/2014  . Coronary atherosclerosis of native coronary artery 11/07/2013  . Acute CVA (cerebrovascular accident) (HCC) 11/03/2013  . Diverticulosis 04/04/2013  . Blood loss anemia 04/04/2013  . Arthritis 04/04/2013  . ALLERGIC RHINITIS 02/21/2010  . DM (diabetes mellitus) (HCC) 02/15/2010  . HYPERLIPIDEMIA 02/15/2010  . OBSTRUCTIVE SLEEP APNEA 02/15/2010  . Essential hypertension 02/15/2010  . RHINOSINUSITIS, CHRONIC 02/15/2010  . ASTHMA 02/15/2010    Past Surgical History:  Procedure Laterality Date  . ABDOMINAL HYSTERECTOMY    . CORONARY ANGIOPLASTY    . DECOMPRESSIVE LUMBAR LAMINECTOMY LEVEL 1  01/06/12  . EYE SURGERY     CAT IOL LFT  . HIP ARTHROPLASTY     BIL  . implantable loop recorder placement  10/2013   MDT LINQ implanted by Dr Johney Frame for cryptogenic stroke  . KNEE ARTHROPLASTY     LFT  . LOOP RECORDER IMPLANT Left 11/06/2013   Procedure: LOOP RECORDER IMPLANT;  Surgeon: Gardiner Rhyme, MD;  Location: [facility] CATH [facility];  Service: Cardiovascular;  Laterality: Left;    OB History    No data available       Home Medications    Prior to Admission medications   Medication Sig Start Date End  Date Taking? Authorizing Provider  atorvastatin (LIPITOR) 20 MG tablet Take 20 mg by mouth every morning.    Yes Historical Provider, MD  IRON PO Take 1 capsule by mouth daily.   Yes Historical Provider, MD  Memantine HCl-Donepezil HCl (NAMZARIC) 28-10 MG CP24 Take 1 capsule by mouth at bedtime.   Yes Historical Provider, MD  metFORMIN (GLUCOPHAGE) 500 MG tablet Take 500 mg by mouth 2 (two) times daily with a meal.   Yes Historical Provider, MD  metoprolol tartrate (LOPRESSOR) 25 MG tablet Take 25 mg by mouth 2 (two) times daily.   Yes Historical Provider, MD  nitroGLYCERIN (NITROSTAT) 0.4 MG SL tablet Place 0.4 mg under the tongue every 5 (five) minutes as needed for chest pain. x3 doses as needed for chest pain   Yes Historical Provider, MD  oxybutynin (DITROPAN-XL) 5 MG 24 hr  tablet Take 5 mg by mouth every morning.  07/26/15  Yes Historical Provider, MD  sitaGLIPtin (JANUVIA) 100 MG tablet Take 100 mg by mouth every morning.    Yes Historical Provider, MD  traMADol (ULTRAM) 50 MG tablet Take 50 mg by mouth every 8 (eight) hours as needed for moderate pain or severe pain.  08/24/16  Yes Historical Provider, MD  Vitamin D, Ergocalciferol, (DRISDOL) 50000 units CAPS capsule Take 50,000 Units by mouth every Sunday. 07/09/16  Yes Historical Provider, MD  cephALEXin (KEFLEX) 500 MG capsule Take 1 capsule (500 mg total) by mouth 2 (two) times daily. 09/01/16   Cheri Fowler, PA-C  telmisartan-hydrochlorothiazide (MICARDIS HCT) 40-12.5 MG tablet TAKE ONE TABLET BY MOUTH ONCE DAILY Patient not taking: Reported on 09/01/2016 02/23/16   Hillis Range, MD    Family History Family History  Problem Relation Age of Onset  . Heart disease Mother   . Heart disease Father   . Diabetes      father  . Other      atherosclerosis mother's side    Social History Social History  Substance Use Topics  . Smoking status: Never Smoker  . Smokeless tobacco: Not on file  . Alcohol use No     Allergies   Codeine   Review of Systems Review of Systems All other systems negative unless otherwise stated in HPI   Physical Exam Updated Vital Signs BP 166/90 (BP Location: Right Arm)   Pulse 80   Temp 98 F (36.7 C) (Oral)   Resp 23   Ht 5' (1.524 m)   Wt 52.2 kg   SpO2 94%   BMI 22.46 kg/m   Physical Exam  Constitutional: She is oriented to person, place, and time. She appears well-developed and well-nourished.  Non-toxic appearance. She does not have a sickly appearance. She does not appear ill.  HENT:  Head: Normocephalic and atraumatic.  Mouth/Throat: Oropharynx is clear and moist.  Eyes: Conjunctivae are normal. Pupils are equal, round, and reactive to light.  Neck: Normal range of motion. Neck supple.  Cardiovascular: Normal rate and regular rhythm.   Pulmonary/Chest: Effort  normal and breath sounds normal. No accessory muscle usage or stridor. No respiratory distress. She has no wheezes. She has no rhonchi. She has no rales.  Abdominal: Soft. Bowel sounds are normal. She exhibits no distension. There is no tenderness. There is no rebound and no guarding.  Musculoskeletal: Normal range of motion.  Lymphadenopathy:    She has no cervical adenopathy.  Neurological: She is alert and oriented to person, place, and time. She has normal strength. No cranial nerve deficit or  sensory deficit. GCS eye subscore is 4. GCS verbal subscore is 5. GCS motor subscore is 6.  Speech clear without dysarthria.  Skin: Skin is warm and dry.  Psychiatric: She has a normal mood and affect. Her behavior is normal.     ED Treatments / Results  Labs (all labs ordered are listed, but only abnormal results are displayed) Labs Reviewed  BASIC METABOLIC PANEL - Abnormal; Notable for the following:       Result Value   Creatinine, Ser 1.50 (*)    GFR calc non Af Amer 32 (*)    GFR calc Af Amer 38 (*)    All other components within normal limits  CBC - Abnormal; Notable for the following:    WBC 3.6 (*)    RDW 16.3 (*)    All other components within normal limits  URINALYSIS, ROUTINE W REFLEX MICROSCOPIC (NOT AT [facility]) - Abnormal; Notable for the following:    APPearance CLOUDY (*)    Nitrite POSITIVE (*)    All other components within normal limits  URINE MICROSCOPIC-ADD ON - Abnormal; Notable for the following:    Squamous Epithelial / LPF 6-30 (*)    Bacteria, UA MANY (*)    All other components within normal limits  URINE CULTURE  CBG MONITORING, ED    EKG  EKG Interpretation  Date/Time:  Friday September 01 2016 11:10:26 EDT Ventricular Rate:  63 PR Interval:    QRS Duration: 128 QT Interval:  483 QTC Calculation: 495 R Axis:   -55 Text Interpretation:  Sinus rhythm RBBB and LAFB No significant change since last tracing Confirmed by KNAPP  MD-J, JON ([id number]) on  09/01/2016 11:25:54 AM       Radiology No results found.  Procedures Procedures (including critical care time)  Medications Ordered in ED Medications  sodium chloride 0.9 % bolus 500 mL (500 mLs Intravenous New Bag/Given 09/01/16 1307)  sodium chloride 0.9 % bolus 500 mL (500 mLs Intravenous New Bag/Given 09/01/16 1307)     Initial Impression / Assessment and Plan / ED Course  I have reviewed the triage vital signs and the nursing notes.  Pertinent labs & imaging results that were available during my care of the patient were reviewed by me and considered in my medical decision making (see chart for details).  Clinical Course   Patient presents with generalized weakness.  VSS, NAD.  Heart RRR,lungs CTAB, abdomen soft and benign.  No focal neurological deficits.  DDx includes anemia, metabolic dearrangement, UTI.  Low suspicion for MI, CVA, GIB.  Will obtain EKG and labs.  Will give IVF.  Workup reveals UTI, nitrite positive, many bacteria.  Culture sent.  Renal function at baseline.  Will send home with Keflex.  Follow up PCP.  Return precautions discussed.  Stable for discharge.    Final Clinical Impressions(s) / ED Diagnoses   Final diagnoses:  UTI (lower urinary tract infection)    New Prescriptions New Prescriptions   CEPHALEXIN (KEFLEX) 500 MG CAPSULE    Take 1 capsule (500 mg total) by mouth 2 (two) times daily.     Cheri Fowler, PA-C 09/01/16 1526    Linwood Dibbles, MD 09/01/16 [phone]

## 2016-09-02 LAB — URINE CULTURE

## 2016-09-07 ENCOUNTER — Observation Stay (HOSPITAL_COMMUNITY): Payer: Medicare Other

## 2016-09-07 ENCOUNTER — Observation Stay (HOSPITAL_COMMUNITY)
Admission: AD | Admit: 2016-09-07 | Discharge: 2016-09-11 | Disposition: A | Discharge status: Skilled Nursing Facility | Payer: Medicare Other | Source: Ambulatory Visit | Attending: Internal Medicine | Admitting: Internal Medicine

## 2016-09-07 DIAGNOSIS — E785 Hyperlipidemia, unspecified: Secondary | ICD-10-CM | POA: Diagnosis not present

## 2016-09-07 DIAGNOSIS — D709 Neutropenia, unspecified: Secondary | ICD-10-CM | POA: Insufficient documentation

## 2016-09-07 DIAGNOSIS — G8929 Other chronic pain: Secondary | ICD-10-CM | POA: Insufficient documentation

## 2016-09-07 DIAGNOSIS — I10 Essential (primary) hypertension: Secondary | ICD-10-CM | POA: Diagnosis present

## 2016-09-07 DIAGNOSIS — I6330 Cerebral infarction due to thrombosis of unspecified cerebral artery: Secondary | ICD-10-CM | POA: Insufficient documentation

## 2016-09-07 DIAGNOSIS — E1122 Type 2 diabetes mellitus with diabetic chronic kidney disease: Secondary | ICD-10-CM | POA: Diagnosis not present

## 2016-09-07 DIAGNOSIS — K219 Gastro-esophageal reflux disease without esophagitis: Secondary | ICD-10-CM | POA: Insufficient documentation

## 2016-09-07 DIAGNOSIS — I638 Other cerebral infarction: Principal | ICD-10-CM | POA: Insufficient documentation

## 2016-09-07 DIAGNOSIS — R531 Weakness: Secondary | ICD-10-CM

## 2016-09-07 DIAGNOSIS — Z79899 Other long term (current) drug therapy: Secondary | ICD-10-CM | POA: Insufficient documentation

## 2016-09-07 DIAGNOSIS — J45909 Unspecified asthma, uncomplicated: Secondary | ICD-10-CM | POA: Diagnosis not present

## 2016-09-07 DIAGNOSIS — Z90710 Acquired absence of both cervix and uterus: Secondary | ICD-10-CM | POA: Insufficient documentation

## 2016-09-07 DIAGNOSIS — F0390 Unspecified dementia without behavioral disturbance: Secondary | ICD-10-CM | POA: Insufficient documentation

## 2016-09-07 DIAGNOSIS — I4891 Unspecified atrial fibrillation: Secondary | ICD-10-CM | POA: Insufficient documentation

## 2016-09-07 DIAGNOSIS — I6501 Occlusion and stenosis of right vertebral artery: Secondary | ICD-10-CM | POA: Diagnosis not present

## 2016-09-07 DIAGNOSIS — H409 Unspecified glaucoma: Secondary | ICD-10-CM | POA: Diagnosis not present

## 2016-09-07 DIAGNOSIS — R29810 Facial weakness: Secondary | ICD-10-CM

## 2016-09-07 DIAGNOSIS — Z87442 Personal history of urinary calculi: Secondary | ICD-10-CM | POA: Diagnosis not present

## 2016-09-07 DIAGNOSIS — I129 Hypertensive chronic kidney disease with stage 1 through stage 4 chronic kidney disease, or unspecified chronic kidney disease: Secondary | ICD-10-CM | POA: Diagnosis not present

## 2016-09-07 DIAGNOSIS — M1990 Unspecified osteoarthritis, unspecified site: Secondary | ICD-10-CM

## 2016-09-07 DIAGNOSIS — Z833 Family history of diabetes mellitus: Secondary | ICD-10-CM | POA: Insufficient documentation

## 2016-09-07 DIAGNOSIS — Z8249 Family history of ischemic heart disease and other diseases of the circulatory system: Secondary | ICD-10-CM | POA: Insufficient documentation

## 2016-09-07 DIAGNOSIS — E119 Type 2 diabetes mellitus without complications: Secondary | ICD-10-CM

## 2016-09-07 DIAGNOSIS — M069 Rheumatoid arthritis, unspecified: Secondary | ICD-10-CM | POA: Diagnosis not present

## 2016-09-07 DIAGNOSIS — N183 Chronic kidney disease, stage 3 unspecified: Secondary | ICD-10-CM | POA: Diagnosis present

## 2016-09-07 DIAGNOSIS — Z96641 Presence of right artificial hip joint: Secondary | ICD-10-CM | POA: Insufficient documentation

## 2016-09-07 DIAGNOSIS — I639 Cerebral infarction, unspecified: Secondary | ICD-10-CM

## 2016-09-07 DIAGNOSIS — Z8673 Personal history of transient ischemic attack (TIA), and cerebral infarction without residual deficits: Secondary | ICD-10-CM | POA: Insufficient documentation

## 2016-09-07 DIAGNOSIS — M545 Low back pain, unspecified: Secondary | ICD-10-CM

## 2016-09-07 DIAGNOSIS — K5791 Diverticulosis of intestine, part unspecified, without perforation or abscess with bleeding: Secondary | ICD-10-CM | POA: Insufficient documentation

## 2016-09-07 DIAGNOSIS — I4892 Unspecified atrial flutter: Secondary | ICD-10-CM | POA: Insufficient documentation

## 2016-09-07 DIAGNOSIS — R471 Dysarthria and anarthria: Secondary | ICD-10-CM | POA: Diagnosis not present

## 2016-09-07 DIAGNOSIS — H5500 Unspecified nystagmus: Secondary | ICD-10-CM | POA: Insufficient documentation

## 2016-09-07 DIAGNOSIS — R29898 Other symptoms and signs involving the musculoskeletal system: Secondary | ICD-10-CM

## 2016-09-07 DIAGNOSIS — M2578 Osteophyte, vertebrae: Secondary | ICD-10-CM | POA: Diagnosis not present

## 2016-09-07 DIAGNOSIS — I679 Cerebrovascular disease, unspecified: Secondary | ICD-10-CM

## 2016-09-07 DIAGNOSIS — Z9841 Cataract extraction status, right eye: Secondary | ICD-10-CM | POA: Insufficient documentation

## 2016-09-07 DIAGNOSIS — G8250 Quadriplegia, unspecified: Secondary | ICD-10-CM | POA: Insufficient documentation

## 2016-09-07 DIAGNOSIS — I2510 Atherosclerotic heart disease of native coronary artery without angina pectoris: Secondary | ICD-10-CM | POA: Diagnosis not present

## 2016-09-07 DIAGNOSIS — Z794 Long term (current) use of insulin: Secondary | ICD-10-CM | POA: Insufficient documentation

## 2016-09-07 DIAGNOSIS — Z7982 Long term (current) use of aspirin: Secondary | ICD-10-CM | POA: Insufficient documentation

## 2016-09-07 DIAGNOSIS — Z9842 Cataract extraction status, left eye: Secondary | ICD-10-CM | POA: Insufficient documentation

## 2016-09-07 DIAGNOSIS — G4730 Sleep apnea, unspecified: Secondary | ICD-10-CM | POA: Insufficient documentation

## 2016-09-07 DIAGNOSIS — M6281 Muscle weakness (generalized): Secondary | ICD-10-CM

## 2016-09-07 LAB — COMPREHENSIVE METABOLIC PANEL
ALT: 11 U/L — ABNORMAL LOW (ref 14–54)
ANION GAP: 6 (ref 5–15)
AST: 20 U/L (ref 15–41)
Albumin: 2.8 g/dL — ABNORMAL LOW (ref 3.5–5.0)
Alkaline Phosphatase: 78 U/L (ref 38–126)
BUN: 16 mg/dL (ref 6–20)
CALCIUM: 9.2 mg/dL (ref 8.9–10.3)
CHLORIDE: 108 mmol/L (ref 101–111)
CO2: 25 mmol/L (ref 22–32)
Creatinine, Ser: 1.47 mg/dL — ABNORMAL HIGH (ref 0.44–1.00)
GFR calc non Af Amer: 33 mL/min — ABNORMAL LOW (ref >60)
GFR, EST AFRICAN AMERICAN: 39 mL/min — AB (ref >60)
Glucose, Bld: 155 mg/dL — ABNORMAL HIGH (ref 65–99)
POTASSIUM: 3.7 mmol/L (ref 3.5–5.1)
SODIUM: 139 mmol/L (ref 135–145)
Total Bilirubin: 0.7 mg/dL (ref 0.3–1.2)
Total Protein: 6.3 g/dL — ABNORMAL LOW (ref 6.5–8.1)

## 2016-09-07 LAB — CBC
HCT: 36.7 % (ref 36.0–46.0)
Hemoglobin: 11.7 g/dL — ABNORMAL LOW (ref 12.0–15.0)
MCH: 25.8 pg — AB (ref 26.0–34.0)
MCHC: 31.9 g/dL (ref 30.0–36.0)
MCV: 81 fL (ref 78.0–100.0)
PLATELETS: 168 10*3/uL (ref 150–400)
RBC: 4.53 MIL/uL (ref 3.87–5.11)
RDW: 15.6 % — AB (ref 11.5–15.5)
WBC: 2.8 10*3/uL — ABNORMAL LOW (ref 4.0–10.5)

## 2016-09-07 LAB — GLUCOSE, CAPILLARY: Glucose-Capillary: 110 mg/dL — ABNORMAL HIGH (ref 65–99)

## 2016-09-07 MED ORDER — MEMANTINE HCL-DONEPEZIL HCL ER 28-10 MG PO CP24: 1.0000 | ORAL_CAPSULE | Freq: Every day | ORAL | Status: DC

## 2016-09-07 MED ORDER — ASPIRIN 300 MG RE SUPP: 300.0000 mg | Freq: Every day | RECTAL | Status: DC

## 2016-09-07 MED ORDER — STROKE: EARLY STAGES OF RECOVERY BOOK
Freq: Once | Status: AC
  Administered 2016-09-08: 01:00:00
  Filled 2016-09-07: qty 1

## 2016-09-07 MED ORDER — METOPROLOL TARTRATE 25 MG PO TABS
25.0000 mg | ORAL_TABLET | Freq: Two times a day (BID) | ORAL | Status: DC
Start: 2016-09-07 — End: 2016-09-11
  Administered 2016-09-07 – 2016-09-11 (×8): 25 mg via ORAL
  Filled 2016-09-07 (×8): qty 1

## 2016-09-07 MED ORDER — INSULIN ASPART 100 UNIT/ML ~~LOC~~ SOLN
0.0000 [IU] | Freq: Three times a day (TID) | SUBCUTANEOUS | Status: DC
  Administered 2016-09-08: 2 [IU] via SUBCUTANEOUS
  Administered 2016-09-10: 1 [IU] via SUBCUTANEOUS
  Administered 2016-09-11: 2 [IU] via SUBCUTANEOUS

## 2016-09-07 MED ORDER — ASPIRIN 325 MG PO TABS: 325.0000 mg | ORAL_TABLET | Freq: Every day | ORAL | Status: DC

## 2016-09-07 MED ORDER — MEMANTINE HCL ER 28 MG PO CP24
28.0000 mg | ORAL_CAPSULE | Freq: Every day | ORAL | Status: DC
  Administered 2016-09-07 – 2016-09-11 (×5): 28 mg via ORAL
  Filled 2016-09-07 (×5): qty 1

## 2016-09-07 MED ORDER — DONEPEZIL HCL 10 MG PO TABS
10.0000 mg | ORAL_TABLET | Freq: Every day | ORAL | Status: DC
  Administered 2016-09-07 – 2016-09-10 (×4): 10 mg via ORAL
  Filled 2016-09-07 (×4): qty 1

## 2016-09-07 MED ORDER — HEPARIN SODIUM (PORCINE) 5000 UNIT/ML IJ SOLN
5000.0000 [IU] | Freq: Three times a day (TID) | INTRAMUSCULAR | Status: DC
  Administered 2016-09-07 – 2016-09-11 (×12): 5000 [IU] via SUBCUTANEOUS
  Filled 2016-09-07 (×11): qty 1

## 2016-09-07 MED ORDER — ATORVASTATIN CALCIUM 20 MG PO TABS
20.0000 mg | ORAL_TABLET | ORAL | Status: DC
  Administered 2016-09-08 – 2016-09-11 (×4): 20 mg via ORAL
  Filled 2016-09-07 (×4): qty 1

## 2016-09-07 NOTE — H&P (Addendum)
History and Physical    [name]:[id number] DOB: Aug 19, 1936 DOA: 09/07/2016  PCP: Gwenyth Bender, MD  Patient coming from: Home --> PCP office --> direct [facility]   Chief Complaint: generalized weakness and left lower leg weakness x 2 weeks  HPI: [name] is a 80 y.o. female with medical history significant of paroxysmal A Flutter (not on xarelto or aspirin after lower GI bleed episode), previous stroke, DM type 2, HTN, HLD, mild dementia, CKD, rheumatoid arthritis who presents with chief complaint of generalized weakness for 2 weeks. She initially started having symptoms about 2 weeks ago. At baseline, patient is able to ambulate around the house with a cane or a walker. However, about 2 weeks ago, he had had to help her with ambulation as well as transport her by carrying her. She was seen in the ED, a week ago for this, and was diagnosed with a UTI and was sent home with Keflex. He states that he thought she was getting better at first, however, she continued to decline. At this point, son had to carry patient around for all ambulatory transfers. Patient had an appointment with PCP today and was evaluated by Dr. August Saucer. Dr. August Saucer elected to have patient directly admitted for further workup of generalized weakness. Patient also states that she has decreased sensation and weakness of left lower extremity greater than right side. This has been going on for a week. Son also notes that patient has had more difficulty with speech over the past 2 weeks as well. None of her symptoms are acute in the past 24 hours. She denies any fevers, chills, chest pain, shortness of breath, cough, nausea, vomiting, diarrhea, abdominal pain, dysuria, peripheral edema.  Review of Systems: As per HPI otherwise 10 point review of systems negative.   Past Medical History:  Diagnosis Date  . Anemia   . Angina    INFREG  . Arthritis   . Asthma    NO PROBLEMS RECENTLY  . Atrial flutter (HCC)    on  xarelto  . Blood transfusion    LATE 12 4 UNITS FOR GI BLEED  . CAD (coronary artery disease)   . Chronic kidney disease    STONES  . Complication of anesthesia    OCC HALLUCINATIONS  . Diabetes mellitus    OFF JANUVIA AND HUMALOG SINCE NOV  . Diverticulosis    diverticulitis with rectal bleeding 2012  . GI bleed    12  4 UNITS OF BLOOD  . 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 Hypertension   . Kidney stone   . Memory loss   . Rheumatoid arthritis (HCC)   . S/P angioplasty    10/12  . Shortness of breath   . Sleep apnea    06    Past Surgical History:  Procedure Laterality Date  . ABDOMINAL HYSTERECTOMY    . CORONARY ANGIOPLASTY    . DECOMPRESSIVE LUMBAR LAMINECTOMY LEVEL 1  01/06/12  . EYE SURGERY     CAT IOL LFT  . HIP ARTHROPLASTY     BIL  . implantable loop recorder placement  10/2013   MDT LINQ implanted by Dr Johney Frame for cryptogenic stroke  . KNEE ARTHROPLASTY     LFT  . LOOP RECORDER IMPLANT Left 11/06/2013   Procedure: LOOP RECORDER IMPLANT;  Surgeon: Gardiner Rhyme, MD;  Location: [facility];  Service: Cardiovascular;  Laterality: Left;     reports that  she has never smoked. She does not have any smokeless tobacco history on file. She reports that she does not drink alcohol or use drugs.  Allergies  Allergen Reactions  . Codeine Nausea Only and Other (See Comments)    REACTION: GI upset    Family History  Problem Relation Age of Onset  . Heart disease Mother   . Heart disease Father   . Diabetes      father  . Other      atherosclerosis mother's side    Prior to Admission medications   Medication Sig Start Date End Date Taking? Authorizing Provider  atorvastatin (LIPITOR) 20 MG tablet Take 20 mg by mouth every morning.     Historical Provider, MD  cephALEXin (KEFLEX) 500 MG capsule Take 1 capsule (500 mg total) by mouth 2 (two) times daily. 09/01/16   Cheri Fowler, PA-C  IRON PO Take 1 capsule by mouth daily.    Historical Provider, MD    Memantine HCl-Donepezil HCl (NAMZARIC) 28-10 MG CP24 Take 1 capsule by mouth at bedtime.    Historical Provider, MD  metFORMIN (GLUCOPHAGE) 500 MG tablet Take 500 mg by mouth 2 (two) times daily with a meal.    Historical Provider, MD  metoprolol tartrate (LOPRESSOR) 25 MG tablet Take 25 mg by mouth 2 (two) times daily.    Historical Provider, MD  nitroGLYCERIN (NITROSTAT) 0.4 MG SL tablet Place 0.4 mg under the tongue every 5 (five) minutes as needed for chest pain. x3 doses as needed for chest pain    Historical Provider, MD  oxybutynin (DITROPAN-XL) 5 MG 24 hr tablet Take 5 mg by mouth every morning.  07/26/15   Historical Provider, MD  sitaGLIPtin (JANUVIA) 100 MG tablet Take 100 mg by mouth every morning.     Historical Provider, MD  telmisartan-hydrochlorothiazide (MICARDIS HCT) 40-12.5 MG tablet TAKE ONE TABLET BY MOUTH ONCE DAILY Patient not taking: Reported on 09/01/2016 02/23/16   Hillis Range, MD  traMADol (ULTRAM) 50 MG tablet Take 50 mg by mouth every 8 (eight) hours as needed for moderate pain or severe pain.  08/24/16   Historical Provider, MD  Vitamin D, Ergocalciferol, (DRISDOL) 50000 units CAPS capsule Take 50,000 Units by mouth every Sunday. 07/09/16   Historical Provider, MD    Physical Exam: Vitals:   09/07/16 1826  BP: 124/80  Pulse: 82  Resp: 18  Temp: 98.3 F (36.8 C)  TempSrc: Oral  SpO2: 100%   Constitutional: NAD, calm, comfortable Eyes: PERRL, lids and conjunctivae normal ENMT: Mucous membranes are moist. Posterior pharynx clear of any exudate or lesions. Neck: normal, supple, no masses, no thyromegaly Respiratory: clear to auscultation bilaterally, no wheezing, no crackles. Normal respiratory effort. No accessory muscle use.  Cardiovascular: Regular rate and rhythm, no murmurs / rubs / gallops. No extremity edema. 2+ pedal pulses.  Abdomen: no tenderness, no masses palpated. No hepatosplenomegaly. Bowel sounds positive.  Musculoskeletal: no clubbing / cyanosis. No  joint deformity upper and lower extremities. Good ROM, no contractures. Normal muscle tone.  Skin: no rashes, lesions, ulcers. +livedo reticularis of left lower leg, chronic  Neurologic: CN 2-12 grossly intact. Sensation reduced on LLE compared to RLE, weakness of all 4 extremities, strength is 4/5 all 4 limbs, no focal weakness, speech is slow  Psychiatric: Normal judgment and insight. Alert and oriented x 3. Normal mood.   Labs on Admission: I have personally reviewed following labs and imaging studies  CBC:  Recent Labs Lab 09/01/16 1208  WBC 3.6*  HGB 12.9  HCT 37.7  MCV 79.0  PLT 204   Basic Metabolic Panel:  Recent Labs Lab 09/01/16 1208  NA 141  K 4.5  CL 108  CO2 25  GLUCOSE 83  BUN 17  CREATININE 1.50*  CALCIUM 9.6   GFR: Estimated Creatinine Clearance: 22.6 mL/min (by C-G formula based on SCr of 1.5 mg/dL (H)). Liver Function Tests: No results for input(s): AST, ALT, ALKPHOS, BILITOT, PROT, ALBUMIN in the last 168 hours. No results for input(s): LIPASE, AMYLASE in the last 168 hours. No results for input(s): AMMONIA in the last 168 hours. Coagulation Profile: No results for input(s): INR, PROTIME in the last 168 hours. Cardiac Enzymes: No results for input(s): CKTOTAL, CKMB, CKMBINDEX, TROPONINI in the last 168 hours. BNP (last 3 results) No results for input(s): PROBNP in the last 8760 hours. HbA1C: No results for input(s): HGBA1C in the last 72 hours. CBG:  Recent Labs Lab 09/01/16 1207  GLUCAP 80   Lipid Profile: No results for input(s): CHOL, HDL, LDLCALC, TRIG, CHOLHDL, LDLDIRECT in the last 72 hours. Thyroid Function Tests: No results for input(s): TSH, T4TOTAL, FREET4, T3FREE, THYROIDAB in the last 72 hours. Anemia Panel: No results for input(s): VITAMINB12, FOLATE, FERRITIN, TIBC, IRON, RETICCTPCT in the last 72 hours. Urine analysis:    Component Value Date/Time   COLORURINE YELLOW 09/01/2016 1052   APPEARANCEUR CLOUDY (A) 09/01/2016  1052   LABSPEC 1.013 09/01/2016 1052   PHURINE 7.0 09/01/2016 1052   GLUCOSEU NEGATIVE 09/01/2016 1052   HGBUR NEGATIVE 09/01/2016 1052   BILIRUBINUR NEGATIVE 09/01/2016 1052   KETONESUR NEGATIVE 09/01/2016 1052   PROTEINUR NEGATIVE 09/01/2016 1052   UROBILINOGEN 1.0 11/03/2013 1351   NITRITE POSITIVE (A) 09/01/2016 1052   LEUKOCYTESUR NEGATIVE 09/01/2016 1052   Sepsis Labs: !!!!!!!!!!!!!!!!!!!!!!!!!!!!!!!!!!!!!!!!!!!! @LABRCNTIP (procalcitonin:4,lacticidven:4) ) Recent Results (from the past 240 hour(s))  Urine culture     Status: Abnormal   Collection Time: 09/01/16 10:52 AM  Result Value Ref Range Status   Specimen Description URINE, RANDOM  Final   Special Requests NONE  Final   Culture MULTIPLE SPECIES PRESENT, SUGGEST RECOLLECTION (A)  Final   Report Status 09/02/2016 FINAL  Final     Radiological Exams on Admission: No results found.  Assessment/Plan Principal Problem:   Weakness Active Problems:   DM (diabetes mellitus) (HCC)   Hyperlipidemia   Essential hypertension   Arthritis   Dementia   Atrial flutter (HCC)   CKD (chronic kidney disease) stage 3, GFR 30-59 ml/min   Dysarthria  LLE weakness, decreased sensation, and dysarthria   Symptoms ongoing for 2 weeks. Will admit as TIA/CVA work up and rule out. Doubt UTI as patient's last urine culture showed contaminant, patient did not improve with Keflex and also does not have dysuria complaints.   Hx of CVA 10/2013   Obtain labs  Stroke team - paged neuro on-call x 2 without response. Will need to consult neurology in AM  CT head without contrast   Echo  Carotid US  Ha1c, lipid panel   Tele  PT/OT/SLP  Asa daily - will hold until CT head result neg for bleed   Paroxysmal A Flutter   Patient current RRR  Xarelto stopped after GI bleed episode, patient also not on aspirin or plavix   CHADSVAsc 8  HASBLED 5  Tele   HLD   Lipitor 20mg  daily  Dementia  Memantine-Donepezil   DM type 2, with  complication including CKD  Hold metformin, januvia  SSI   Check  Ha1c   Essential HTN  Lopressor   CKD stage 3  Baseline Cr ~ 1.3-1.5 in the past 2 years  Chronic back pain due to arthritis  Stable and unchanged    DVT prophylaxis: subq hep  Code Status: Full Family Communication: son at bedside  Disposition Plan: pending work up and treatment  Consults called: None Admission status: observation  Noralee StainJennifer Jeromey Kruer, DO [facility] Pager [phone]  If 7PM-7AM, please contact night-coverage [email or web address] Password [email or web address] 09/07/2016, 7:41 PM

## 2016-09-08 ENCOUNTER — Observation Stay (HOSPITAL_COMMUNITY): Payer: Medicare Other

## 2016-09-08 ENCOUNTER — Observation Stay (HOSPITAL_BASED_OUTPATIENT_CLINIC_OR_DEPARTMENT_OTHER): Payer: Medicare Other

## 2016-09-08 ENCOUNTER — Ambulatory Visit (HOSPITAL_BASED_OUTPATIENT_CLINIC_OR_DEPARTMENT_OTHER): Payer: Medicare Other

## 2016-09-08 ENCOUNTER — Encounter (HOSPITAL_COMMUNITY): Payer: Self-pay | Admitting: *Deleted

## 2016-09-08 DIAGNOSIS — R531 Weakness: Secondary | ICD-10-CM

## 2016-09-08 DIAGNOSIS — F0390 Unspecified dementia without behavioral disturbance: Secondary | ICD-10-CM | POA: Diagnosis not present

## 2016-09-08 DIAGNOSIS — I6789 Other cerebrovascular disease: Secondary | ICD-10-CM | POA: Diagnosis not present

## 2016-09-08 DIAGNOSIS — I638 Other cerebral infarction: Secondary | ICD-10-CM | POA: Diagnosis not present

## 2016-09-08 LAB — LIPID PANEL
CHOL/HDL RATIO: 3.6 ratio
Cholesterol: 130 mg/dL (ref 0–200)
HDL: 36 mg/dL — AB (ref >40)
LDL CALC: 82 mg/dL (ref 0–99)
Triglycerides: 62 mg/dL (ref <150)
VLDL: 12 mg/dL (ref 0–40)

## 2016-09-08 LAB — VAS US CAROTID
LCCAPDIAS: 14 cm/s
LEFT ECA DIAS: -5 cm/s
LEFT VERTEBRAL DIAS: 13 cm/s
LICAPSYS: -69 cm/s
Left CCA dist dias: -11 cm/s
Left CCA dist sys: -80 cm/s
Left CCA prox sys: 96 cm/s
Left ICA dist dias: -14 cm/s
Left ICA dist sys: -52 cm/s
Left ICA prox dias: -16 cm/s
RCCADSYS: -49 cm/s
RCCAPDIAS: 13 cm/s
RCCAPSYS: 75 cm/s
RIGHT ECA DIAS: -4 cm/s
RIGHT VERTEBRAL DIAS: 8 cm/s

## 2016-09-08 LAB — CBC WITH DIFFERENTIAL/PLATELET
Basophils Absolute: 0 10*3/uL (ref 0.0–0.1)
Basophils Relative: 1 %
EOS PCT: 3 %
Eosinophils Absolute: 0.1 10*3/uL (ref 0.0–0.7)
HCT: 34.7 % — ABNORMAL LOW (ref 36.0–46.0)
Hemoglobin: 11.1 g/dL — ABNORMAL LOW (ref 12.0–15.0)
LYMPHS PCT: 60 %
Lymphs Abs: 1.8 10*3/uL (ref 0.7–4.0)
MCH: 25.8 pg — ABNORMAL LOW (ref 26.0–34.0)
MCHC: 32 g/dL (ref 30.0–36.0)
MCV: 80.5 fL (ref 78.0–100.0)
MONO ABS: 0.7 10*3/uL (ref 0.1–1.0)
Monocytes Relative: 23 %
NEUTROS PCT: 13 %
Neutro Abs: 0.4 10*3/uL — ABNORMAL LOW (ref 1.7–7.7)
PLATELETS: 200 10*3/uL (ref 150–400)
RBC: 4.31 MIL/uL (ref 3.87–5.11)
RDW: 15.6 % — ABNORMAL HIGH (ref 11.5–15.5)
WBC: 3 10*3/uL — AB (ref 4.0–10.5)

## 2016-09-08 LAB — BASIC METABOLIC PANEL
ANION GAP: 6 (ref 5–15)
BUN: 15 mg/dL (ref 6–20)
CO2: 24 mmol/L (ref 22–32)
Calcium: 9.3 mg/dL (ref 8.9–10.3)
Chloride: 110 mmol/L (ref 101–111)
Creatinine, Ser: 1.26 mg/dL — ABNORMAL HIGH (ref 0.44–1.00)
GFR calc Af Amer: 46 mL/min — ABNORMAL LOW (ref >60)
GFR, EST NON AFRICAN AMERICAN: 40 mL/min — AB (ref >60)
GLUCOSE: 99 mg/dL (ref 65–99)
POTASSIUM: 4.6 mmol/L (ref 3.5–5.1)
SODIUM: 140 mmol/L (ref 135–145)

## 2016-09-08 LAB — TSH: TSH: 0.573 u[IU]/mL (ref 0.350–4.500)

## 2016-09-08 LAB — GLUCOSE, CAPILLARY
GLUCOSE-CAPILLARY: 102 mg/dL — AB (ref 65–99)
GLUCOSE-CAPILLARY: 120 mg/dL — AB (ref 65–99)
GLUCOSE-CAPILLARY: 90 mg/dL (ref 65–99)
Glucose-Capillary: 94 mg/dL (ref 65–99)
Glucose-Capillary: 173 mg/dL — ABNORMAL HIGH (ref 65–99)
Glucose-Capillary: 111 mg/dL — ABNORMAL HIGH (ref 65–99)

## 2016-09-08 LAB — VITAMIN B12: VITAMIN B 12: 1373 pg/mL — AB (ref 180–914)

## 2016-09-08 LAB — CK: Total CK: 20 U/L — ABNORMAL LOW (ref 38–234)

## 2016-09-08 LAB — SEDIMENTATION RATE: Sed Rate: 50 mm/hr — ABNORMAL HIGH (ref 0–22)

## 2016-09-08 LAB — ECHOCARDIOGRAM COMPLETE

## 2016-09-08 LAB — PATHOLOGIST SMEAR REVIEW

## 2016-09-08 LAB — FOLATE: FOLATE: 25.8 ng/mL (ref >5.9)

## 2016-09-08 MED ORDER — ASPIRIN 325 MG PO TABS
325.0000 mg | ORAL_TABLET | Freq: Every day | ORAL | Status: DC
  Administered 2016-09-08 – 2016-09-11 (×4): 325 mg via ORAL
  Filled 2016-09-08 (×4): qty 1

## 2016-09-08 MED ORDER — METHOCARBAMOL 500 MG PO TABS
500.0000 mg | ORAL_TABLET | Freq: Three times a day (TID) | ORAL | Status: DC
Start: 2016-09-08 — End: 2016-09-09
  Administered 2016-09-08 (×3): 500 mg via ORAL
  Filled 2016-09-08 (×3): qty 1

## 2016-09-08 MED ORDER — THIAMINE HCL 100 MG/ML IJ SOLN
500.0000 mg | Freq: Every day | INTRAVENOUS | Status: AC
  Administered 2016-09-08 – 2016-09-10 (×3): 500 mg via INTRAVENOUS
  Filled 2016-09-08 (×3): qty 5

## 2016-09-08 MED ORDER — ENSURE ENLIVE PO LIQD
237.0000 mL | Freq: Two times a day (BID) | ORAL | Status: DC
  Administered 2016-09-09 – 2016-09-11 (×4): 237 mL via ORAL

## 2016-09-08 MED ORDER — VITAMIN B-1 100 MG PO TABS
100.0000 mg | ORAL_TABLET | Freq: Every day | ORAL | Status: DC
  Administered 2016-09-11: 100 mg via ORAL
  Filled 2016-09-08: qty 1

## 2016-09-08 NOTE — Progress Notes (Signed)
VASCULAR LAB PRELIMINARY  PRELIMINARY  PRELIMINARY  PRELIMINARY  Carotid duplex completed.    Preliminary report:  Bilateral:  1-39% ICA stenosis.  Vertebral artery flow is antegrade.     Shakia Sebastiano, RVS 09/08/2016, 5:13 PM

## 2016-09-08 NOTE — Clinical Social Work Note (Signed)
Clinical Social Work Assessment  Patient Details  Name: [name] MRN: [id number] Date of Birth: 12-Jan-1936  Date of referral:  09/08/16               Reason for consult:  Facility Placement                Permission sought to share information with:  Oceanographer granted to share information::  Yes, Verbal Permission Granted  Name::     Woody Seller  Agency::  [facility]  Relationship::  sons  Contact Information:     Housing/Transportation Living arrangements for the past 2 months:  Single Family Home Source of Information:  Patient, Adult Children Patient Interpreter Needed:  None Criminal Activity/Legal Involvement Pertinent to Current Situation/Hospitalization:  No - Comment as needed Significant Relationships:  Adult Children Lives with:  Adult Children Do you feel safe going back to the place where you live?  No Need for family participation in patient care:  Yes (Comment) (help with ADLs at home)  Care giving concerns:  Pt lives at home with son- pt normally able to ambulate with assistive devices but currently very debilitated following a stroke and does not have sufficient assistance at home.   Social Worker assessment / plan:  CSW spoke with pt and pt son concerning PT recommendation for SNF at time of DC to increase pt mobility/strength.  Pt participated minimally with interview but was pleasant and responsive when addressed.  Pt son reports that pt has been to SNF in the past after a previous stroke.  Employment status:  Retired Database administrator PT Recommendations:  Skilled Nursing Facility Information / Referral to community resources:  Skilled Nursing Facility  Patient/Family's Response to care:  Pt and son agreeable to SNF and understands pt need for increased caregiving after stroke.  Patient/Family's Understanding of and Emotional Response to Diagnosis, Current Treatment, and Prognosis:  Patient and son have  no questions or concerns at this time- patient has had stroke in the past and they are hopeful she will recover mobility following this incident.  Emotional Assessment Appearance:  Appears stated age Attitude/Demeanor/Rapport:    Affect (typically observed):  Appropriate Orientation:  Oriented to Self, Oriented to Place Alcohol / Substance use:  Not Applicable Psych involvement (Current and /or in the community):  No (Comment)  Discharge Needs  Concerns to be addressed:  Care Coordination Readmission within the last 30 days:  No Current discharge risk:  Physical Impairment Barriers to Discharge:  Continued Medical Work up   Burna Sis, LCSW 09/08/2016, 3:09 PM

## 2016-09-08 NOTE — Evaluation (Signed)
Physical Therapy Evaluation Patient Details Name: [name] MRN: [id number] DOB: 12-05-1936 Today's Date: 09/08/2016   History of Present Illness  pt is a 80 y/o female with pmh of paroxysmal A flutter, previous stroke, DM2, HTN, mild dementia, RA, presenting with generalized weakness for 2 weeks.  pt reports weakness and sensation worse on the left.  MRI shows watershed type stroke scattered front to back in left hemisphere.  Clinical Impression  .Pt admitted with/for general weakness and determined by MRI to have suffered watershed strokes on Left.  Pt currently limited functionally due to the problems listed. ( See problems list.)   Pt will benefit from PT to maximize function and safety in order to get ready for next venue listed below.     Follow Up Recommendations SNF    Equipment Recommendations   (TBA)    Recommendations for Other Services       Precautions / Restrictions Precautions Precautions: Fall Restrictions Weight Bearing Restrictions: No      Mobility  Bed Mobility Overal bed mobility: Needs Assistance Bed Mobility: Supine to Sit     Supine to sit: Total assist;HOB elevated     General bed mobility comments: pt generally low tone with little ability to coordinate movements to help with the specific task  Transfers Overall transfer level: Needs assistance   Transfers: Sit to/from Stand;Squat Pivot Transfers Sit to Stand: Total assist   Squat pivot transfers: Total assist     General transfer comment: pt unable to coordinate/execute assist to bear weight and pain in mid back is likely distracting from task.  Ambulation/Gait             General Gait Details: pt unable  Stairs            Wheelchair Mobility    Modified Rankin (Stroke Patients Only) Modified Rankin (Stroke Patients Only) Pre-Morbid Rankin Score: Moderate disability Modified Rankin: Severe disability     Balance Overall balance assessment: Needs  assistance Sitting-balance support: Bilateral upper extremity supported;Single extremity supported Sitting balance-Leahy Scale: Poor Sitting balance - Comments: unable to support without assist.  Maintained L of midline propped on L elbow for a short time.     Standing balance-Leahy Scale: Zero                               Pertinent Vitals/Pain Pain Assessment: Faces Faces Pain Scale: Hurts even more Pain Location: back/spine Pain Descriptors / Indicators: Grimacing;Sharp Pain Intervention(s): Limited activity within patient's tolerance;Monitored during session;Repositioned    Home Living Family/patient expects to be discharged to:: Unsure Living Arrangements: Children (son lives with her, but works) Available Help at Discharge: Family;Available PRN/intermittently Type of Home: House Home Access: Stairs to enter Entrance Stairs-Rails: Doctor, general practice of Steps: 2 Home Layout: One level Home Equipment: Psychologist, educational - 2 wheels      Prior Function Level of Independence: Needs assistance   Gait / Transfers Assistance Needed: pt reports walking with RW normally.     Comments: Difficult to get PLOF question answered     Hand Dominance        Extremity/Trunk Assessment   Upper Extremity Assessment: Generalized weakness           Lower Extremity Assessment: Generalized weakness;RLE deficits/detail;LLE deficits/detail (bil grossly 3- to 3/5; bil quad burst to 3+/5, bil incoordin) RLE Deficits / Details: uncoordinated, flopping out in ER LLE Deficits / Details: incoordinated, low tone, weak at <  3/5     Communication   Communication: No difficulties  Cognition Arousal/Alertness: Awake/alert Behavior During Therapy: Flat affect Overall Cognitive Status: History of cognitive impairments - at baseline (mild dementia)                      General Comments      Exercises     Assessment/Plan    PT Assessment Patient  needs continued PT services  PT Problem List Decreased strength;Decreased activity tolerance;Decreased balance;Decreased mobility;Decreased coordination;Decreased knowledge of use of DME;Impaired sensation;Pain       PT Diagnosis   Dysarthria - Plan: CT Head Wo Contrast, CT Head Wo Contrast, MR Brain Wo Contrast, MR Cervical Spine Wo Contrast, MR Brain Wo Contrast, MR Cervical Spine Wo Contrast  Weakness of lower extremity - Plan: CT Head Wo Contrast, CT Head Wo Contrast  Lumbago - Plan: DG Lumbar Spine 2-3Vclearing, DG Lumbar Spine 2-3Vclearing  Weakness of both arms - Plan: MR Brain Wo Contrast, MR Cervical Spine Wo Contrast, MR Brain Wo Contrast, MR Cervical Spine Wo Contrast  Weakness of both legs - Plan: MR Brain Wo Contrast, MR Cervical Spine Wo Contrast, MR Brain Wo Contrast, MR Cervical Spine Wo Contrast  Weakness of both arms - Plan: MR Brain Wo Contrast, MR Cervical Spine Wo Contrast, MR Brain Wo Contrast, MR Cervical Spine Wo Contrast  Weakness of both legs - Plan: MR Brain Wo Contrast, MR Cervical Spine Wo Contrast, MR Brain Wo Contrast, MR Cervical Spine Wo Contrast  Weakness of both legs - Plan: MR Brain Wo Contrast, MR Cervical Spine Wo Contrast, MR Brain Wo Contrast, MR Cervical Spine Wo Contrast    PT Treatment Interventions Functional mobility training;Therapeutic activities;Balance training;Neuromuscular re-education;Patient/family education    PT Goals (Current goals can be found in the Care Plan section)  Acute Rehab PT Goals Patient Stated Goal: pt unable to participate with goals PT Goal Formulation: Patient unable to participate in goal setting Time For Goal Achievement: 09/22/16 Potential to Achieve Goals: Fair    Frequency Min 3X/week   Barriers to discharge Decreased caregiver support      Co-evaluation               End of Session   Activity Tolerance: Patient limited by pain;Patient limited by fatigue Patient left: in chair;with call  bell/phone within reach;with chair alarm set Nurse Communication: Mobility status;Need for lift equipment    Functional Assessment Tool Used: clinical observation Functional Limitation: Mobility: Walking and moving around Mobility: Walking and Moving Around Current Status [phone]): At least 80 percent but less than 100 percent impaired, limited or restricted Mobility: Walking and Moving Around Goal Status [phone]): At least 60 percent but less than 80 percent impaired, limited or restricted    Time: 0981-1914 PT Time Calculation (min) (ACUTE ONLY): 33 min   Charges:   PT Evaluation $PT Eval Moderate Complexity: 1 Procedure PT Treatments $Therapeutic Activity: 8-22 mins   PT G Codes:   PT G-Codes **NOT FOR INPATIENT CLASS** Functional Assessment Tool Used: clinical observation Functional Limitation: Mobility: Walking and moving around Mobility: Walking and Moving Around Current Status ([id number]): At least 80 percent but less than 100 percent impaired, limited or restricted Mobility: Walking and Moving Around Goal Status [phone]): At least 60 percent but less than 80 percent impaired, limited or restricted    [name], Eliseo Gum 09/08/2016, 12:13 PM  09/08/2016  Enigma Bing, PT [phone]  (pager)

## 2016-09-08 NOTE — Progress Notes (Signed)
   09/08/16 1000  Clinical Encounter Type  Visited With Patient  Visit Type Spiritual support  Referral From Nurse  Consult/Referral To Chaplain  Recommendations (Have unit chaplain follow up )  Spiritual Encounters  Spiritual Needs Prayer  Stress Factors  Patient Stress Factors Exhausted;Loss of control;Major life changes  Family Stress Factors None identified  Chaplain responded to consult, patient in some distress and asked for prayer. Chaplain prayed with patient and provided emotional support through active listening.

## 2016-09-08 NOTE — Consult Note (Signed)
Requesting Physician: Dr. Mahala MenghiniSamtani    Chief Complaint: Stroke  History obtained from:  Chart     HPI:                                                                                                                                         [name] is an 80 y.o. female with stroke risk factors of diabetes and hypertension. Patient presents to the hospital with generalized weakness. Patient had an appointment with PCP today and was evaluated by Dr. August Saucerean. Dr. August Saucerean elected to have patient directly admitted for further workup of generalized weakness. Patient also states that she has decreased sensation and weakness of left lower extremity greater than right side. This has been going on for a week. Son also notes that patient has had more difficulty with speech over the past 2 weeks as well. Patient is a poor historian secondary to dementia. Patient currently is sitting in a chair and shows significant deconditioning and slurred speech.  Date last known well: Unable to determine Time last known well: Unable to determine tPA Given: No: out of window   Past Medical History:  Diagnosis Date  . Anemia   . Angina    INFREG  . Arthritis   . Asthma    NO PROBLEMS RECENTLY  . Atrial flutter (HCC)    on xarelto  . Blood transfusion    LATE 12 4 UNITS FOR GI BLEED  . CAD (coronary artery disease)   . Chronic kidney disease    STONES  . Complication of anesthesia    OCC HALLUCINATIONS  . Diabetes mellitus    OFF JANUVIA AND HUMALOG SINCE NOV  . Diverticulosis    diverticulitis with rectal bleeding 2012  . GI bleed    12  4 UNITS OF BLOOD  . 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 Hypertension   . Kidney stone   . Memory loss   . Rheumatoid arthritis (HCC)   . S/P angioplasty    10/12  . Shortness of breath   . Sleep apnea    06    Past Surgical History:  Procedure Laterality Date  . ABDOMINAL HYSTERECTOMY    . CORONARY ANGIOPLASTY    . DECOMPRESSIVE LUMBAR LAMINECTOMY  LEVEL 1  01/06/12  . EYE SURGERY     CAT IOL LFT  . HIP ARTHROPLASTY     BIL  . implantable loop recorder placement  10/2013   MDT LINQ implanted by Dr Johney FrameAllred for cryptogenic stroke  . KNEE ARTHROPLASTY     LFT  . LOOP RECORDER IMPLANT Left 11/06/2013   Procedure: LOOP RECORDER IMPLANT;  Surgeon: Gardiner RhymeJames D Allred, MD;  Location: [facility] CATH [facility];  Service: Cardiovascular;  Laterality: Left;    Family History  Problem Relation Age of Onset  . Heart disease Mother   . Heart disease Father   .  Diabetes      father  . Other      atherosclerosis mother's side   Social History:  reports that she has never smoked. She does not have any smokeless tobacco history on file. She reports that she does not drink alcohol or use drugs.  Allergies:  Allergies  Allergen Reactions  . Codeine Nausea Only and Other (See Comments)    REACTION: GI upset    Medications:                                                                                                                           Prior to Admission:  Prescriptions Prior to Admission  Medication Sig Dispense Refill Last Dose  . atorvastatin (LIPITOR) 20 MG tablet Take 20 mg by mouth every morning.    08/31/2016 at Unknown time  . cephALEXin (KEFLEX) 500 MG capsule Take 1 capsule (500 mg total) by mouth 2 (two) times daily. 14 capsule 0   . IRON PO Take 1 capsule by mouth daily.   08/31/2016 at Unknown time  . Memantine HCl-Donepezil HCl (NAMZARIC) 28-10 MG CP24 Take 1 capsule by mouth at bedtime.   08/31/2016 at Unknown time  . metFORMIN (GLUCOPHAGE) 500 MG tablet Take 500 mg by mouth 2 (two) times daily with a meal.   08/31/2016 at Unknown time  . metoprolol tartrate (LOPRESSOR) 25 MG tablet Take 25 mg by mouth 2 (two) times daily.   08/31/2016 at 1200  . nitroGLYCERIN (NITROSTAT) 0.4 MG SL tablet Place 0.4 mg under the tongue every 5 (five) minutes as needed for chest pain. x3 doses as needed for chest pain   unknown  . oxybutynin (DITROPAN-XL) 5 MG 24  hr tablet Take 5 mg by mouth every morning.    08/31/2016 at Unknown time  . sitaGLIPtin (JANUVIA) 100 MG tablet Take 100 mg by mouth every morning.    08/31/2016 at Unknown time  . traMADol (ULTRAM) 50 MG tablet Take 50 mg by mouth every 8 (eight) hours as needed for moderate pain or severe pain.    08/31/2016 at Unknown time  . Vitamin D, Ergocalciferol, (DRISDOL) 50000 units CAPS capsule Take 50,000 Units by mouth every Sunday.   08/27/2016   Scheduled: . aspirin  325 mg Oral Daily  . atorvastatin  20 mg Oral BH-q7a  . donepezil  10 mg Oral QHS  . heparin  5,000 Units Subcutaneous Q8H  . insulin aspart  0-9 Units Subcutaneous TID WC  . memantine  28 mg Oral Daily  . methocarbamol  500 mg Oral TID  . metoprolol tartrate  25 mg Oral BID    ROS:  History obtained from the patient  General ROS: negative for - chills, fatigue, fever, night sweats, weight gain or weight loss Psychological ROS: negative for - behavioral disorder, hallucinations, memory difficulties, mood swings or suicidal ideation Ophthalmic ROS: negative for - blurry vision, double vision, eye pain or loss of vision ENT ROS: negative for - epistaxis, nasal discharge, oral lesions, sore throat, tinnitus or vertigo Allergy and Immunology ROS: negative for - hives or itchy/watery eyes Hematological and Lymphatic ROS: negative for - bleeding problems, bruising or swollen lymph nodes Endocrine ROS: negative for - galactorrhea, hair pattern changes, polydipsia/polyuria or temperature intolerance Respiratory ROS: negative for - cough, hemoptysis, shortness of breath or wheezing Cardiovascular ROS: negative for - chest pain, dyspnea on exertion, edema or irregular heartbeat Gastrointestinal ROS: negative for - abdominal pain, diarrhea, hematemesis, nausea/vomiting or stool incontinence Genito-Urinary ROS:  negative for - dysuria, hematuria, incontinence or urinary frequency/urgency Musculoskeletal ROS: negative for - joint swelling or muscular weakness Neurological ROS: as noted in HPI Dermatological ROS: negative for rash and skin lesion changes  Neurologic Examination:                                                                                                      Blood pressure 128/66, pulse 66, temperature 97.9 F (36.6 C), temperature source Oral, resp. rate 18, SpO2 99 %.  HEENT-  Normocephalic, no lesions, without obvious abnormality.  Normal external eye and conjunctiva.  Normal TM's bilaterally.  Normal auditory canals and external ears. Normal external nose, mucus membranes and septum.  Normal pharynx. Cardiovascular- S1, S2 normal, pulses palpable throughout   Lungs- chest clear, no wheezing, rales, normal symmetric air entry Abdomen- normal findings: bowel sounds normal Extremities- no edema Lymph-no adenopathy palpable Musculoskeletal-no joint tenderness, deformity or swelling Skin-warm and dry, no hyperpigmentation, vitiligo, or suspicious lesions  Neurological Examination Mental Status: Alert, oriented to hospital month, and year.  Speech dysarthric without evidence of aphasia.  Able to follow simple commands without difficulty. Cranial Nerves: II:  Visual fields grossly normal, pupils equal, round, reactive to light and accommodation III,IV, VI: ptosis not present, extra-ocular motions intact bilaterally--she shows nystagmus when looking laterally to the left and also vertical gaze. When looking to the left the fast phase is to the left when looking up the fast phase is upwards. V,VII: smile asymmetric on the right, facial light touch sensation normal bilaterally VIII: hearing normal bilaterally IX,X: uvula rises symmetrically XI: bilateral shoulder shrug XII: midline tongue extension Motor: Patient has 4-5 strength throughout it was noted that when she bends her left  knee and hip her leg tracks medially. Patient has significant muscle wasting throughout bilateral lower extremities and upper extremities. No fasciculations noted. In addition with her upper extremities patient can only forward flex and abduct bilateral arms to approximately 45. Patient cannot recall the last time she was able to raise her arms above her head. Sensory: Pinprick and light touch intact and patient subjectively feels as though she is feeling more on the right than the left Deep Tendon Reflexes: 2+ and symmetric throughout bilateral upper  extremities she has 2+ bilateral patellar reflexes, but may have mild spasticity at the hips.  Plantars: Downgoing bilaterally  Cerebellar: Finger nose is difficult to assess secondary to limited range of motion. No significant dysmetria was noted with what she could do. Patient was unable to do heel to shin secondary to weakness. Gait: Not tested      Lab Results: Basic Metabolic Panel:  Recent Labs Lab 09/01/16 1208 09/07/16 1944 09/08/16 0803  NA 141 139 140  K 4.5 3.7 4.6  CL 108 108 110  CO2 25 25 24   GLUCOSE 83 155* 99  BUN 17 16 15   CREATININE 1.50* 1.47* 1.26*  CALCIUM 9.6 9.2 9.3    Liver Function Tests:  Recent Labs Lab 09/07/16 1944  AST 20  ALT 11*  ALKPHOS 78  BILITOT 0.7  PROT 6.3*  ALBUMIN 2.8*   No results for input(s): LIPASE, AMYLASE in the last 168 hours. No results for input(s): AMMONIA in the last 168 hours.  CBC:  Recent Labs Lab 09/01/16 1208 09/07/16 1944 09/08/16 0803  WBC 3.6* 2.8* 3.0*  NEUTROABS  --   --  0.4*  HGB 12.9 11.7* 11.1*  HCT 37.7 36.7 34.7*  MCV 79.0 81.0 80.5  PLT 204 168 200    Cardiac Enzymes: No results for input(s): CKTOTAL, CKMB, CKMBINDEX, TROPONINI in the last 168 hours.  Lipid Panel:  Recent Labs Lab 09/08/16 0645  CHOL 130  TRIG 62  HDL 36*  CHOLHDL 3.6  VLDL 12  LDLCALC 82    CBG:  Recent Labs Lab 09/07/16 2042 09/08/16 0019  09/08/16 0412 09/08/16 0807 09/08/16 1150  GLUCAP 110* 90 94 102* 120*    Microbiology: Results for orders placed or performed during the hospital encounter of 09/01/16  Urine culture     Status: Abnormal   Collection Time: 09/01/16 10:52 AM  Result Value Ref Range Status   Specimen Description URINE, RANDOM  Final   Special Requests NONE  Final   Culture MULTIPLE SPECIES PRESENT, SUGGEST RECOLLECTION (A)  Final   Report Status 09/02/2016 FINAL  Final    Coagulation Studies: No results for input(s): LABPROT, INR in the last 72 hours.  Imaging: Ct Head Wo Contrast  Result Date: 09/07/2016 CLINICAL DATA:  Generalized weakness and left lower leg weakness for 2 weeks. Dysarthria. EXAM: CT HEAD WITHOUT CONTRAST TECHNIQUE: Contiguous axial images were obtained from the base of the skull through the vertex without intravenous contrast. COMPARISON:  Head CT and MRI 11/03/2013 FINDINGS: Brain: There is a new lacunar infarct involving the right caudate and internal capsule which is chronic in appearance. A small chronic left parietal cortical infarct is noted, acute on the prior MRI. A chronic lacunar infarct is again seen in the left thalamus, with likely a chronic right thalamic lacunar infarct as well. Subcortical and periventricular white matter hypodensities have mildly progressed from the prior CT and are compatible with moderate chronic small vessel ischemic disease. There is likely a small chronic infarct in the pons. No definite acute cortically based infarct, intracranial hemorrhage, mass, midline shift, or extra-axial fluid collection is seen. There is moderate cerebral atrophy. Vascular: Focal density of the right MCA in the sylvian fissure in the expected region of the MCA bifurcation, also asymmetrically dense on the prior CT although slightly more prominent on the current examination. Mild carotid siphon calcification. Skull: No skull fracture or aggressive osseous lesion.  Sinuses/Orbits: Moderate left and trace right mastoid effusions. Prior bilateral cataract extraction. Clear sinuses.  Other: None. IMPRESSION: 1. No intracranial hemorrhage or definite acute infarct. 2. Hyperattenuation of the right MCA in the region of the MCA bifurcation, favored to reflect atherosclerosis rather than acute thrombus given duration of patient's symptoms and lack of CT evidence of acute MCA infarct. 3. Moderate chronic ischemic changes as above. Electronically Signed   By: Sebastian Ache M.D.   On: 09/07/2016 23:47       Assessment and plan discussed with with attending physician and they are in agreement.    Felicie Morn PA-C [facility] [phone]  09/08/2016, 11:52 AM   Assessment: 80 y.o. female with progressive decline in her functional status over the past few weeks. It is unclear to me if she really has an acute change, or she reached a threshold where she was no longer able to compensate for her gait disability with a cane. She does appear to have some cerebellar findings on exam, with dysmetria bilaterally and nystagmus. One concern in this setting would be for possible chronic encephalopathy given her weight loss, among other possible metabolic causes.  1) MRI brain and C-spine 2) draw thiamine level, then start IV thiamine 500 mg daily 3) vitamin E, RPR, TSH, B12 4) further recommendations following the above evaluation  Ritta Slot, MD [facility] [phone]  If 7pm- 7am, please page neurology on call as listed in [facility]ION.

## 2016-09-08 NOTE — [place] FL2 (Signed)
[place] MEDICAID FL2 LEVEL OF CARE SCREENING TOOL     IDENTIFICATION  Patient Name: [name] Birthdate: 02-13-1936 Sex: female Admission Date (Current Location): 09/07/2016  [facility] and [place] Number:  Producer, television/film/video and Address:  [facility], [address] [place], [place] [address]      Provider Number: [id number]  Attending Physician Name and Address:  Rhetta Mura, MD  Relative Name and Phone Number:       Current Level of Care: Hospital Recommended Level of Care: Skilled Nursing Facility Prior Approval Number:    Date Approved/Denied:   PASRR Number: [id number]  Discharge Plan: SNF    Current Diagnoses: Patient Active Problem List   Diagnosis Date Noted  . Weakness 09/07/2016  . Dysarthria 09/07/2016  . Diverticulosis of colon with hemorrhage 08/02/2015  . CKD (chronic kidney disease) stage 3, GFR 30-59 ml/min 08/02/2015  . Atrial flutter (HCC) 11/18/2014  . Cerebrovascular small vessel disease 04/08/2014  . Dementia 01/20/2014  . Coronary atherosclerosis of native coronary artery 11/07/2013  . Acute CVA (cerebrovascular accident) (HCC) 11/03/2013  . Diverticulosis 04/04/2013  . Blood loss anemia 04/04/2013  . Arthritis 04/04/2013  . ALLERGIC RHINITIS 02/21/2010  . DM (diabetes mellitus) (HCC) 02/15/2010  . Hyperlipidemia 02/15/2010  . OBSTRUCTIVE SLEEP APNEA 02/15/2010  . Essential hypertension 02/15/2010  . RHINOSINUSITIS, CHRONIC 02/15/2010  . ASTHMA 02/15/2010    Orientation RESPIRATION BLADDER Height & Weight     Self, Place  Normal Incontinent Weight:   Height:     BEHAVIORAL SYMPTOMS/MOOD NEUROLOGICAL BOWEL NUTRITION STATUS  Other (Comment) (none)   Continent  (carb modified)  AMBULATORY STATUS COMMUNICATION OF NEEDS Skin   Extensive Assist Verbally Normal                       Personal Care Assistance Level of Assistance  Total care       Total Care Assistance: Maximum assistance    Functional Limitations Info  Sight Sight Info: Impaired        SPECIAL CARE FACTORS FREQUENCY  PT (By licensed PT), OT (By licensed OT) (insulin dependent)     PT Frequency: 5 times a day OT Frequency: 5 times a day            Contractures Contractures Info: Not present    Additional Factors Info  Insulin Sliding Scale (Full Code)       Insulin Sliding Scale Info: 3 times a day       Current Medications (09/08/2016):  This is the current hospital active medication list Current Facility-Administered Medications  Medication Dose Route Frequency Provider Last Rate Last Dose  . aspirin tablet 325 mg  325 mg Oral Daily Rhetta Mura, MD   325 mg at 09/08/16 1016  . atorvastatin (LIPITOR) tablet 20 mg  20 mg Oral [address] [place], DO   20 mg at 09/08/16 7353  . donepezil (ARICEPT) tablet 10 mg  10 mg Oral QHS Jordan Hawks, DO   10 mg at 09/07/16 2229  . heparin injection 5,000 Units  5,000 Units Subcutaneous [address] [place], DO   5,000 Units at 09/08/16 [phone]  . insulin aspart (novoLOG) injection 0-9 Units  0-9 Units Subcutaneous TID WC Jennifer Chahn-Yang Choi, DO      . memantine (NAMENDA XR) 24 hr capsule 28 mg  28 mg Oral Daily [place], DO   28 mg at 09/08/16 1016  . methocarbamol (ROBAXIN)  tablet 500 mg  500 mg Oral TID Rhetta Mura, MD   500 mg at 09/08/16 1156  . metoprolol tartrate (LOPRESSOR) tablet 25 mg  25 mg Oral BID Carlton Adam Choi, DO   25 mg at 09/08/16 1016  . [START ON 09/11/2016] thiamine (VITAMIN B-1) tablet 100 mg  100 mg Oral Daily Ulice Dash, PA-C      . thiamine 500mg  in normal saline (26ml) IVPB  500 mg Intravenous Daily 45m, PA-C         Discharge Medications: Please see discharge summary for a list of discharge medications.  Relevant Imaging Results:  Relevant Lab Results:   Additional Information Ulice Dash  [id number] Antoin, Gretta Cool

## 2016-09-08 NOTE — Progress Notes (Signed)
SHARLON PFOHL ACZ:[id number] DOB: 08-19-1936 DOA: 09/07/2016 PCP: Gwenyth Bender, MD  Brief narrative: 64 ? Aflutter not on [facility] [previously xarelto] stopped after diverticular bleed 07/2015-Chad2Vasc2 8 `-prior h/o bleed 4 2014 as well, [facility] Prior CVA 2014-initally was on Plavix DM ty ii prior CAD s/p PTCA 10/2/1/2 htn hld Mild dementia ckd stg iii Rh arthritis  admited with generalized weakness in a setting of LLE weakness and debility Work-upc concerning for possible CVA   Neuro consulted  Past medical history-As per Problem list Chart reviewed as below-   Consultants:  Neurology  Procedures:  MRI of the brain showing atherosclerosis severe  Pending are echocardiogram, carotid ultrasound, lumbar spinal x-rays 2-3 views  Antibiotics:  None at present   Subjective  Cannot tell me why she is here [employer] Knows who the president is, can tell me date time and year Not clear as to the instances leading up to her being admitted her    Objective    Interim History: None  Telemetry: Atrial flutter/fib rate controlled   Objective: Vitals:   09/07/16 2228 09/08/16 0021 09/08/16 0257 09/08/16 0400  BP: 139/71 (!) 162/81 (!) 149/73 (!) 162/76  Pulse: 68 64 61 61  Resp: 20 18    Temp: 98.2 F (36.8 C) 97.9 F (36.6 C)    TempSrc: Oral Oral    SpO2: 100% 99%      Intake/Output Summary (Last 24 hours) at 09/08/16 0855 Last data filed at 09/07/16 2200  Gross per 24 hour  Intake               30 ml  Output                0 ml  Net               30 ml    Exam:  General: Alert pleasant oriented but does not recall last 24 hours clearly Cardiovascular: S1-S2 no murmur rub or gallop irregularly irregular but rate controlled Respiratory: Chest is clinically clear no added sound Abdomen: Abdomen is soft nontender nondistended no rebound no guarding Skin no lower extremity edema no rash Neuro grossly intact able to move all 4 extremities there is  limitation of movement on the left lower extremity with straight leg raising test actively and she has some pain in her lower back on axial rotation of the spine  Data Reviewed: Basic Metabolic Panel:  Recent Labs Lab 09/01/16 1208 09/07/16 1944  NA 141 139  K 4.5 3.7  CL 108 108  CO2 25 25  GLUCOSE 83 155*  BUN 17 16  CREATININE 1.50* 1.47*  CALCIUM 9.6 9.2   Liver Function Tests:  Recent Labs Lab 09/07/16 1944  AST 20  ALT 11*  ALKPHOS 78  BILITOT 0.7  PROT 6.3*  ALBUMIN 2.8*   No results for input(s): LIPASE, AMYLASE in the last 168 hours. No results for input(s): AMMONIA in the last 168 hours. CBC:  Recent Labs Lab 09/01/16 1208 09/07/16 1944  WBC 3.6* 2.8*  HGB 12.9 11.7*  HCT 37.7 36.7  MCV 79.0 81.0  PLT 204 168   Cardiac Enzymes: No results for input(s): CKTOTAL, CKMB, CKMBINDEX, TROPONINI in the last 168 hours. BNP: Invalid input(s): POCBNP CBG:  Recent Labs Lab 09/01/16 1207 09/07/16 2042 09/08/16 0019 09/08/16 0412 09/08/16 0807  GLUCAP 80 110* 90 94 102*    Recent Results (from the past 240 hour(s))  Urine culture  Status: Abnormal   Collection Time: 09/01/16 10:52 AM  Result Value Ref Range Status   Specimen Description URINE, RANDOM  Final   Special Requests NONE  Final   Culture MULTIPLE SPECIES PRESENT, SUGGEST RECOLLECTION (A)  Final   Report Status 09/02/2016 FINAL  Final     Studies:              All Imaging reviewed and is as per above notation   Scheduled Meds: . aspirin  325 mg Oral Daily  . atorvastatin  20 mg Oral BH-q7a  . donepezil  10 mg Oral QHS  . heparin  5,000 Units Subcutaneous Q8H  . insulin aspart  0-9 Units Subcutaneous TID WC  . memantine  28 mg Oral Daily  . metoprolol tartrate  25 mg Oral BID   Continuous Infusions:    Assessment/Plan:  1. Lumbago versus de novo stroke-MRI does not really show any new findings however does comment on TIAs that have been present in the past. Patient has been  seen by Dr. Terrace Arabia in neurology recently. Patient is not on anticoagulation. Feel reasonable to complete stroke workup but more importantly may need to readdress anticoagulation status--I will order 3 view lumbar spine x-rays to rule out any occult fracture and if needed we will need to order a MRI of the back to rule out a radicular caused the pain. She has point tenderness in the paraspinal musculature and I feel we can add Robaxin and see if this helps-doesn't have any hemorrhagic features on head imaging I will start aspirin 325 mg and have this guided by neurology.  Will ask PT to eval as might have HH needs 2. Atrial flutter/fibrillation-continue metoprolol 25 twice a day. May be a candidate to go back on blood thinning agents. Will reassess The feasibility of this going forward. 3. Diabetes mellitus type 2 continue sliding scale insulin, home medications include Januvia, blood sugar ranges this is a.m. 90-135, metformin 500 twice a day which can be restarted on discharge 4. Chronic kidney disease stage II to 3-baseline GFR is in the 40-45 range. Avoid nephrotoxins. Patient is at the cusp of needing renal adjustment of various medications including metformin. 5. Hyperlipidemia-continue atorvastatin 20 mg  6. Possible dementia continue Aricept 10 mg daily at bedtime, Namenda 28 daily  7. Mild neutropenia noted this admission -needs outpatient monitoring if persists at present can just be monitored 8. Reflux-start pantoprazole 40 daily    Called his son to update and obtain information, was not able to reach him on full Needs evaluation by PT OT and neurology Once workup completed possible discharge after therapy gives evaluation.   Pleas Koch, MD  [facility] Pager [phone] 09/08/2016, 8:55 AM    LOS: 0 days

## 2016-09-09 ENCOUNTER — Observation Stay (HOSPITAL_COMMUNITY): Payer: Medicare Other

## 2016-09-09 DIAGNOSIS — I638 Other cerebral infarction: Secondary | ICD-10-CM | POA: Diagnosis not present

## 2016-09-09 DIAGNOSIS — R531 Weakness: Secondary | ICD-10-CM | POA: Diagnosis not present

## 2016-09-09 LAB — COMPREHENSIVE METABOLIC PANEL
ALBUMIN: 2.6 g/dL — AB (ref 3.5–5.0)
ALK PHOS: 72 U/L (ref 38–126)
ALT: 11 U/L — ABNORMAL LOW (ref 14–54)
ANION GAP: 5 (ref 5–15)
AST: 19 U/L (ref 15–41)
BILIRUBIN TOTAL: 0.6 mg/dL (ref 0.3–1.2)
BUN: 16 mg/dL (ref 6–20)
CALCIUM: 9.1 mg/dL (ref 8.9–10.3)
CO2: 25 mmol/L (ref 22–32)
Chloride: 109 mmol/L (ref 101–111)
Creatinine, Ser: 1.37 mg/dL — ABNORMAL HIGH (ref 0.44–1.00)
GFR calc Af Amer: 42 mL/min — ABNORMAL LOW (ref >60)
GFR calc non Af Amer: 36 mL/min — ABNORMAL LOW (ref >60)
GLUCOSE: 104 mg/dL — AB (ref 65–99)
POTASSIUM: 4 mmol/L (ref 3.5–5.1)
SODIUM: 139 mmol/L (ref 135–145)
TOTAL PROTEIN: 6.3 g/dL — AB (ref 6.5–8.1)

## 2016-09-09 LAB — CBC WITH DIFFERENTIAL/PLATELET
BASOS ABS: 0 10*3/uL (ref 0.0–0.1)
Basophils Relative: 1 %
EOS ABS: 0.2 10*3/uL (ref 0.0–0.7)
Eosinophils Relative: 5 %
HEMATOCRIT: 33 % — AB (ref 36.0–46.0)
HEMOGLOBIN: 10.8 g/dL — AB (ref 12.0–15.0)
LYMPHS PCT: 61 %
Lymphs Abs: 1.8 10*3/uL (ref 0.7–4.0)
MCH: 26.2 pg (ref 26.0–34.0)
MCHC: 32.7 g/dL (ref 30.0–36.0)
MCV: 80.1 fL (ref 78.0–100.0)
MONOS PCT: 19 %
Monocytes Absolute: 0.6 10*3/uL (ref 0.1–1.0)
NEUTROS PCT: 14 %
Neutro Abs: 0.4 10*3/uL — ABNORMAL LOW (ref 1.7–7.7)
Platelets: 174 10*3/uL (ref 150–400)
RBC: 4.12 MIL/uL (ref 3.87–5.11)
RDW: 15.8 % — ABNORMAL HIGH (ref 11.5–15.5)
WBC: 3 10*3/uL — AB (ref 4.0–10.5)

## 2016-09-09 LAB — GLUCOSE, CAPILLARY
GLUCOSE-CAPILLARY: 123 mg/dL — AB (ref 65–99)
Glucose-Capillary: 94 mg/dL (ref 65–99)
Glucose-Capillary: 114 mg/dL — ABNORMAL HIGH (ref 65–99)
Glucose-Capillary: 108 mg/dL — ABNORMAL HIGH (ref 65–99)

## 2016-09-09 LAB — HEMOGLOBIN A1C
HEMOGLOBIN A1C: 5.9 % — AB (ref 4.8–5.6)
Mean Plasma Glucose: 123 mg/dL

## 2016-09-09 LAB — RPR: RPR: NONREACTIVE

## 2016-09-09 MED ORDER — METHOCARBAMOL 500 MG PO TABS
500.0000 mg | ORAL_TABLET | Freq: Three times a day (TID) | ORAL | Status: DC | PRN
  Administered 2016-09-09: 500 mg via ORAL
  Filled 2016-09-09: qty 1

## 2016-09-09 MED ORDER — GADOBENATE DIMEGLUMINE 529 MG/ML IV SOLN
6.0000 mL | Freq: Once | INTRAVENOUS | Status: AC | PRN
  Administered 2016-09-11: 6 mL via INTRAVENOUS

## 2016-09-09 NOTE — Progress Notes (Addendum)
Pt transferred to MRI via bed in stable condition, in no distress.

## 2016-09-09 NOTE — Progress Notes (Addendum)
Subjective: MRI overnight reveals stroke  Exam: Vitals:   09/09/16 0509 09/09/16 1417  BP: (!) 149/71 (!) 105/50  Pulse: (!) 55 69  Resp: 18 18  Temp: 98.2 F (36.8 C) 98.1 F (36.7 C)   Gen: In bed, NAD Resp: non-labored breathing, no acute distress Abd: soft, nt  Neuro: MS: Awake, alert, oriented to person, not month CN: She has nystagmus in multiple directions of gaze, pupils equal round and reactive to light Motor: She has a mild left-sided paresis, but is weak throughout.  Sensory: Decreased on the left  Pertinent Labs: LDL 82  Impression: 80 year old female with moderate dementia who has suffered an ischemic event affecting the right pons and right cerebellum. May need to consider anticoagulation over the long term given history of A Flutter, but also with history of GI bleeidng due to xarelto.   Recommendations: 1) increase statin therapy fo rLDL > 70 2) MRA neck to look at posterior circulation.  3) Echo, A1C 4) ASA 325mg  daily 5) PT,OT,ST 6) stroke team to follow.   , MD [facility] [phone]  If 7pm- 7am, please page neurology on call as listed in AMION.

## 2016-09-09 NOTE — Progress Notes (Signed)
Pt returned from 2 hours in MRI and she is very tired.  Her neuro exam was unusual in that she claimed decreased sensation on the left however her right grip and right strength tests were weaker than her left. Her speech is difficult to understand and she is frustrated because she is so tired.  Pt did not have to be I&O cathed due to voiding incontinently of a large amount of urine. Will continue to observe and recheck her neuro status at 0200 as ordered. Tali Coster P Milarose Savich

## 2016-09-09 NOTE — Progress Notes (Signed)
[name] VHQ:[id number] DOB: 1936/02/12 DOA: 09/07/2016 PCP: Gwenyth Bender, MD  Brief narrative: 72 F Aflutter not on [facility] [previously xarelto] stopped after diverticular bleed  07/2015-Chad2Vasc2 8`-prior h/o bleed 4 2014 as well, [facility], Prior CVA 2014-initally was on Plavix, DM ty ii, prior CAD s/p PTCA 10/2/1/2, htn, mild dementia, ckd stg iii Rh arthritis admited with generalized weakness in a setting of LLE weakness and debility Work-upc concerning for possible subacute CVA  Neuro consulted Assessment/Plan:  1. Subacute CVAs -noted on MRI -? Cannot rule out mass in R pons based on this MRI, needs repeat imaging -NEuro following, ? Restart anticoagulation -on ASA 325mg  and Lipitor now -carotid duplex and ECHO within normal limits -Hbaic 5.9 and LDL 82 -PT/OT/SLP evals pending  2. P.Atrial flutter/fibrillation -in NSR now, continue metoprolol 25 twice a day and not on anticoagulation since GI bleed/diverticular in 8/16 was on Xarelto until then  3.  Diabetes mellitus type 2 - continue sliding scale insulin, home medications include Januvia and  Metformin, which can be restarted on discharge  4.  Chronic kidney disease stage II to 3-baseline GFR is in the 40-45 range.  -Avoid nephrotoxins, stable  5. Possible dementia continue Aricept 10 mg daily at bedtime, Namenda 28 daily  -TSH and B12 ok  6.  Mild neutropenia noted this admission -needs outpatient monitoring if persists at present can just be monitored  7. Reflux-start pantoprazole 40 daily   Full Code No family at bedside Dispo: may need rehab, await PT input  Consultants:  Neurology  Procedures:  MRI of the brain showing atherosclerosis severe  Pending are echocardiogram, carotid ultrasound, lumbar spinal x-rays 2-3 views  Antibiotics:  None at present   Subjective  Cannot tell me why she is here 9/16 Knows who the president is, can tell me date time and year Not clear as to the  instances leading up to her being admitted her    Objective    Interim History: None  Telemetry: Atrial flutter/fib rate controlled   Objective: Vitals:   09/08/16 1826 09/08/16 2215 09/09/16 0509 09/09/16 0800  BP: 128/60 138/71 (!) 149/71   Pulse: 65 (!) 59 (!) 55   Resp: 15 19 18    Temp: 97.6 F (36.4 C) 98.5 F (36.9 C) 98.2 F (36.8 C)   TempSrc: Oral     SpO2: 100% 99% 97%   Weight:    52.2 kg (115 lb)  Height: 5' (1.524 m)       Intake/Output Summary (Last 24 hours) at 09/09/16 1337 Last data filed at 09/09/16 0800  Gross per 24 hour  Intake              360 ml  Output                0 ml  Net              360 ml    Exam:  General: Alert pleasant oriented but does not recall last 24 hours clearly Cardiovascular: S1-S2 no murmur rub or gallop irregularly irregular but rate controlled Respiratory: Chest is clinically clear no added sound Abdomen: Abdomen is soft nontender nondistended no rebound no guarding Skin no lower extremity edema no rash Neuro grossly intact able to move all 4 extremities there is limitation of movement on the left lower extremity with straight leg raising test actively and she has some pain in her lower back on axial rotation of the spine  Data Reviewed:  Basic Metabolic Panel:  Recent Labs Lab 09/07/16 1944 09/08/16 0803 09/09/16 0521  NA 139 140 139  K 3.7 4.6 4.0  CL 108 110 109  CO2 25 24 25   GLUCOSE 155* 99 104*  BUN 16 15 16   CREATININE 1.47* 1.26* 1.37*  CALCIUM 9.2 9.3 9.1   Liver Function Tests:  Recent Labs Lab 09/07/16 1944 09/09/16 0521  AST 20 19  ALT 11* 11*  ALKPHOS 78 72  BILITOT 0.7 0.6  PROT 6.3* 6.3*  ALBUMIN 2.8* 2.6*   No results for input(s): LIPASE, AMYLASE in the last 168 hours. No results for input(s): AMMONIA in the last 168 hours. CBC:  Recent Labs Lab 09/07/16 1944 09/08/16 0803 09/09/16 0521  WBC 2.8* 3.0* 3.0*  NEUTROABS  --  0.4* 0.4*  HGB 11.7* 11.1* 10.8*  HCT 36.7  34.7* 33.0*  MCV 81.0 80.5 80.1  PLT 168 200 174   Cardiac Enzymes:  Recent Labs Lab 09/08/16 1628  CKTOTAL 20*   BNP: Invalid input(s): POCBNP CBG:  Recent Labs Lab 09/08/16 1150 09/08/16 1630 09/08/16 2215 09/09/16 0758 09/09/16 1322  GLUCAP 120* 173* 111* 94 123*    Recent Results (from the past 240 hour(s))  Urine culture     Status: Abnormal   Collection Time: 09/01/16 10:52 AM  Result Value Ref Range Status   Specimen Description URINE, RANDOM  Final   Special Requests NONE  Final   Culture MULTIPLE SPECIES PRESENT, SUGGEST RECOLLECTION (A)  Final   Report Status 09/02/2016 FINAL  Final     Studies:              All Imaging reviewed and is as per above notation   Scheduled Meds: . aspirin  325 mg Oral Daily  . atorvastatin  20 mg Oral BH-q7a  . donepezil  10 mg Oral QHS  . feeding supplement (ENSURE ENLIVE)  237 mL Oral BID BM  . heparin  5,000 Units Subcutaneous Q8H  . insulin aspart  0-9 Units Subcutaneous TID WC  . memantine  28 mg Oral Daily  . metoprolol tartrate  25 mg Oral BID  . [START ON 09/11/2016] thiamine  100 mg Oral Daily  . thiamine injection  500 mg Intravenous Daily   Continuous Infusions:       11/02/2016, MD  [facility] Pager [phone] 09/09/2016, 1:37 PM    LOS: 0 days

## 2016-09-09 NOTE — Progress Notes (Signed)
Pt still too sleepy and frustrated to do a meaningful neuro exam at 0200 and this am at 0545.  Do not notice any difference in speech and pt just unwilling to participate fully.  Will report to oncoming RN. Norissa Bartee P Ascher Schroepfer

## 2016-09-09 NOTE — Progress Notes (Signed)
OT Cancellation Note  Patient Details Name: [name] MRN: [id number] DOB: 1936/09/01   Cancelled Treatment:    Reason Eval/Treat Not Completed: Other (comment). Son helping pt to eat upon my arrival. Will try back for eval at a later time.  Evette Georges [phone] 09/09/2016, 2:10 PM

## 2016-09-09 NOTE — Progress Notes (Signed)
Initial Nutrition Assessment   INTERVENTION:  Continue Ensure Enlive po BID, each supplement provides 350 kcal and 20 grams of protein Provide Multivitamin with minerals Monitor PO intake for adequacy   NUTRITION DIAGNOSIS:   Predicted suboptimal nutrient intake related to lethargy/confusion as evidenced by severe depletion of muscle mass.   GOAL:   Patient will meet greater than or equal to 90% of their needs   MONITOR:   PO intake, Supplement acceptance, Labs, Weight trends, I & O's, Skin  REASON FOR ASSESSMENT:   Malnutrition Screening Tool    ASSESSMENT:   80 y.o. female with medical history significant of paroxysmal A Flutter (not on xarelto or aspirin after lower GI bleed episode), previous stroke, DM type 2, HTN, HLD, mild dementia, CKD, rheumatoid arthritis who presents with chief complaint of generalized weakness for 2 weeks. She initially started having symptoms about 2 weeks ago. At baseline, patient is able to ambulate around the house with a cane or a walker. However, about 2 weeks ago, he had had to help her with ambulation as well as transport her by carrying her.   Pt reports that she has a good appetite, has been eating well, and has been maintaining her weight. Per weight history, pt's weight has been stable for several months, but 2 years ago she used to weigh 145 to 150 lbs. Pt has severe muscle wasting of patellar region and thigh and moderate muscle wasting of calves/hands; otherwise she appears well-nourished. Pt reports that she has unable to walk since previous stroke, but per H&P she was able to ambulate with can or walker prior to 2 weeks ago. Per nursing notes, pt ate 50% of breakfast this morning. Pt can't remember if she had any Ensure earlier today.   Labs: glucose ranging 94 to 123 mg/dL, low albumin, low GFR, low hemoglobin, hemoglobin A1c of 5.9%,  folate and vitamin B12 WNL, labs for Vitamin B1 and Vitamin E are pending  Diet Order:  Diet Carb  Modified Fluid consistency: Thin; Room service appropriate? Yes  Skin:  Reviewed, no issues  Last BM:  PTA  Height:   Ht Readings from Last 1 Encounters:  09/08/16 5' (1.524 m)    Weight:   Wt Readings from Last 1 Encounters:  09/09/16 115 lb (52.2 kg)    Ideal Body Weight:  45.5 kg  BMI:  Body mass index is 22.46 kg/m.  Estimated Nutritional Needs:   Kcal:  1250-1450  Protein:  60-70 grams  Fluid:  1.5 L/day  EDUCATION NEEDS:   No education needs identified at this time  Dorothea Ogle RD, LDN, CSP Inpatient Clinical Dietitian Pager: [phone] After Hours Pager: [phone]

## 2016-09-09 NOTE — Evaluation (Signed)
Occupational Therapy Evaluation Patient Details Name: [name] MRN: [id number] DOB: March 06, 1936 Today's Date: 09/09/2016    History of Present Illness pt is a 80 y/o female with pmh of paroxysmal A flutter, previous stroke, DM2, HTN, mild dementia, RA, presenting with generalized weakness for 2 weeks.  pt reports weakness and sensation worse on the left.  MRI shows watershed type stroke scattered front to back in left hemisphere.   Clinical Impression   This 80 yo female admitted with above presents to acute OT with deficits below (see OT deficit list) thus affecting her PLOF of being able to walk and perform her basic ADLs with minimal A . She will benefit from continued OT services to work on increasing independence and decrease burden of care.    Follow Up Recommendations  SNF    Equipment Recommendations  Other (comment) (TBD at next venue)       Precautions / Restrictions Precautions Precautions: Fall Restrictions Weight Bearing Restrictions: No      Mobility Bed Mobility Overal bed mobility: Needs Assistance Bed Mobility: Rolling;Sidelying to Sit;Sit to Supine Rolling: Max assist Sidelying to sit: Max assist   Sit to supine: Total assist;+2 for physical assistance         Balance Overall balance assessment: Needs assistance Sitting-balance support: Bilateral upper extremity supported;Feet supported Sitting balance-Leahy Scale: Poor Sitting balance - Comments: unable to support without assist. Pt sat EOB with minguard to mod A and VCs to work on midline posturing Postural control: Right lateral lean                                  ADL                                         General ADL Comments: total A for all basic ADLs at present               Pertinent Vitals/Pain Pain Assessment: Faces Faces Pain Scale: Hurts whole lot Pain Location: back--when going from sitting to supine NOT supine to sit Pain  Descriptors / Indicators: Grimacing;Moaning Pain Intervention(s): Monitored during session;Repositioned     Hand Dominance Right   Extremity/Trunk Assessment Upper Extremity Assessment Upper Extremity Assessment: RUE deficits/detail;LUE deficits/detail RUE Deficits / Details: grossly 2-/5 RUE Coordination: decreased fine motor;decreased gross motor LUE Deficits / Details: grossly 2-/5 LUE Coordination: decreased fine motor;decreased gross motor           Communication Communication Communication: No difficulties   Cognition Arousal/Alertness: Awake/alert Behavior During Therapy: Flat affect Overall Cognitive Status:  (mild dementia) Area of Impairment: Safety/judgement         Safety/Judgement: Decreased awareness of safety;Decreased awareness of deficits (pt with decreased sitting balance needs VCs to correct)                    Home Living Family/patient expects to be discharged to:: Skilled nursing facility                                                 OT Problem List: Decreased strength;Impaired balance (sitting and/or standing);Impaired UE functional use;Decreased coordination;Decreased range of motion;Pain   OT Treatment/Interventions: Self-care/ADL training;Balance  training;Therapeutic activities;Patient/family education    OT Goals(Current goals can be found in the care plan section) Acute Rehab OT Goals Patient Stated Goal: agreed to work on sitting EOB OT Goal Formulation: With patient/family Time For Goal Achievement: 09/16/16 Potential to Achieve Goals: Fair  OT Frequency: Min 2X/week              End of Session    Activity Tolerance: Patient tolerated treatment well Patient left: in bed;with family/visitor present   Time: 8938-1017 OT Time Calculation (min): 16 min Charges:  OT General Charges $OT Visit: 1 Procedure OT Evaluation $OT Eval Moderate Complexity: 1 Procedure G-Codes: OT G-codes **NOT FOR INPATIENT  CLASS** Functional Assessment Tool Used: Clinical observation Functional Limitation: Self care Self Care Current Status (P1025): At least 80 percent but less than 100 percent impaired, limited or restricted Self Care Goal Status (E5277): At least 60 percent but less than 80 percent impaired, limited or restricted  Evette Georges [phone] 09/09/2016, 3:29 PM

## 2016-09-10 ENCOUNTER — Encounter (HOSPITAL_COMMUNITY): Payer: Self-pay

## 2016-09-10 DIAGNOSIS — R531 Weakness: Secondary | ICD-10-CM | POA: Diagnosis not present

## 2016-09-10 DIAGNOSIS — I638 Other cerebral infarction: Secondary | ICD-10-CM | POA: Diagnosis not present

## 2016-09-10 DIAGNOSIS — I6330 Cerebral infarction due to thrombosis of unspecified cerebral artery: Secondary | ICD-10-CM | POA: Insufficient documentation

## 2016-09-10 LAB — BASIC METABOLIC PANEL
ANION GAP: 6 (ref 5–15)
BUN: 19 mg/dL (ref 6–20)
CALCIUM: 9.5 mg/dL (ref 8.9–10.3)
CO2: 28 mmol/L (ref 22–32)
CREATININE: 1.51 mg/dL — AB (ref 0.44–1.00)
Chloride: 106 mmol/L (ref 101–111)
GFR calc Af Amer: 37 mL/min — ABNORMAL LOW (ref >60)
GFR, EST NON AFRICAN AMERICAN: 32 mL/min — AB (ref >60)
GLUCOSE: 104 mg/dL — AB (ref 65–99)
Potassium: 4.4 mmol/L (ref 3.5–5.1)
Sodium: 140 mmol/L (ref 135–145)

## 2016-09-10 LAB — GLUCOSE, CAPILLARY
Glucose-Capillary: 114 mg/dL — ABNORMAL HIGH (ref 65–99)
Glucose-Capillary: 109 mg/dL — ABNORMAL HIGH (ref 65–99)
Glucose-Capillary: 97 mg/dL (ref 65–99)
Glucose-Capillary: 82 mg/dL (ref 65–99)
Glucose-Capillary: 136 mg/dL — ABNORMAL HIGH (ref 65–99)
Glucose-Capillary: 116 mg/dL — ABNORMAL HIGH (ref 65–99)

## 2016-09-10 NOTE — Progress Notes (Signed)
STROKE TEAM PROGRESS NOTE   HISTORY OF PRESENT ILLNESS (per record) [name] is an 80 y.o. female with stroke risk factors of diabetes and hypertension. Patient presents to the hospital with generalized weakness. Patient had anappointment with PCP today and was evaluated by Dr. August Saucer. Dr. August Saucer elected to have patient directly admitted for further workup of generalized weakness. Patient also states that she has decreased sensation and weakness ofleft lower extremitygreater than right side. This has been going on for a week. Son also notes that patient has had more difficulty with speech over the past 2 weeks as well. Patient is a poor historian secondary to dementia. Patient currently is sitting in a chair and shows significant deconditioning and slurred speech.  Date last known well: Unable to determine Time last known well: Unable to determine tPA Given: No: out of window   SUBJECTIVE (INTERVAL HISTORY) No family members present. The patient appears somewhat confused. She has a history of dementia.   OBJECTIVE Temp:  [97.5 F (36.4 C)-98.1 F (36.7 C)] 97.5 F (36.4 C) (09/17 0546) Pulse Rate:  [64-69] 64 (09/17 0546) Cardiac Rhythm: Normal sinus rhythm (09/17 0355) Resp:  [18] 18 (09/17 0546) BP: (105-144)/(50-69) 131/65 (09/17 0546) SpO2:  [98 %-100 %] 98 % (09/17 0546)  CBC:  Recent Labs Lab 09/08/16 0803 09/09/16 0521  WBC 3.0* 3.0*  NEUTROABS 0.4* 0.4*  HGB 11.1* 10.8*  HCT 34.7* 33.0*  MCV 80.5 80.1  PLT 200 174    Basic Metabolic Panel:  Recent Labs Lab 09/09/16 0521 09/10/16 0620  NA 139 140  K 4.0 4.4  CL 109 106  CO2 25 28  GLUCOSE 104* 104*  BUN 16 19  CREATININE 1.37* 1.51*  CALCIUM 9.1 9.5    Lipid Panel:    Component Value Date/Time   CHOL 130 09/08/2016 0645   TRIG 62 09/08/2016 0645   HDL 36 (L) 09/08/2016 0645   CHOLHDL 3.6 09/08/2016 0645   VLDL 12 09/08/2016 0645   LDLCALC 82 09/08/2016 0645   HgbA1c:  Lab Results   Component Value Date   HGBA1C 5.9 (H) 09/08/2016   Urine Drug Screen: No results found for: LABOPIA, COCAINSCRNUR, LABBENZ, AMPHETMU, THCU, LABBARB    IMAGING  Dg Lumbar Spine 2-3 Views 09/08/2016 No fracture or dislocation is seen. Status post L4-5 PLIF, without evidence of complication. Moderate multilevel degenerative changes.    Mr Angiogram Neck W Wo Contrast 09/09/2016 1. Moderate to severe right vertebral artery origin stenosis. Otherwise negative vertebral arteries and visualized posterior circulation.  2. No cervical carotid artery stenosis.    Mr Brain Wo Contrast 09/08/2016 1. Punctate focus of diffusion restriction within the right paramedian pons and additional small focus in the right brachium pontis with mild associated mass effect probably represent subacute infarctions. The lesion in the right pons with mass effect could possibly represent metastatic or primary brain neoplasm and follow-up brain MRI with contrast is recommended to ensure resolution of diffusion abnormality.  2. Moderate chronic microvascular ischemic changes and parenchymal volume loss of the brain. Multiple small chronic infarcts in white matter and basal ganglia.  3. Nonspecific bilateral mastoid effusions may be related to Eustachian obstruction.     Mr Cervical Spine Wo Contrast 09/08/2016 Extensive degenerative changes of the cervical spine with multilevel canal stenosis moderate from C4 through C6 and severe at the C6-7 level. Additionally there is moderate to severe foraminal narrowing at those levels. No cord compression or abnormal cord signal is identified.  PHYSICAL EXAM  Frail cachectic elderly lady not in distress. . Afebrile. Head is nontraumatic. Neck is supple without bruit.    Cardiac exam no murmur or gallop. Lungs are clear to auscultation. Distal pulses are well felt.  Neurological Exam :  Mental Status: Alert, oriented to hospital month, and year.  Speech dysarthric  without evidence of aphasia.  Able to follow simple commands without difficulty. Cranial Nerves: II:  Visual fields grossly normal, pupils equal, round, reactive to light and accommodation III,IV, VI: ptosis not present, extra-ocular motions intact bilaterally--she shows nystagmus when looking laterally to the left and also vertical gaze.  When looking to the left the fast phase is to the left when looking up the fast phase is upwards. V,VII: smile asymmetric on the right, facial light touch sensation normal bilaterally VIII: hearing normal bilaterally IX,X: uvula rises symmetrically XI: bilateral shoulder shrug XII: midline tongue extension Motor: Patient has 4-5 strength throughout it was noted that when she bends her left knee and hip her leg tracks medially. Patient has significant muscle wasting throughout bilateral lower extremities and upper extremities. No fasciculations noted. In addition with her upper extremities patient can only forward flex and abduct bilateral arms to approximately 45. Patient cannot recall the last time she was able to raise her arms above her head. Sensory: Pinprick and light touch intact and patient subjectively feels as though she is feeling more on the right than the left Deep Tendon Reflexes: 2+ and symmetric throughout bilateral upper extremities she has 2+ bilateral patellar reflexes, but may have mild spasticity at the hips.  Plantars: Downgoing bilaterally  Cerebellar: Finger nose is difficult to assess secondary to limited range of motion. No significant dysmetria was noted with what she could do. Patient was unable to do heel to shin secondary to weakness. Gait: Not tested    ASSESSMENT/PLAN Ms. [name] is a 80 y.o. female with history of dementia, hypertension, previous GI bleed, diabetes mellitus, chronic kidney disease, coronary artery disease, atrial flutter, asthma, and anemia presenting with left lower extremity weakness. She did not  receive IV t-PA due to late presentation.  Stroke:  Non dominant - embolic secondary to atrial fibrillation without anticoagulation (D/C'd after GI bleed)  Resultant  Quadriparesis , dysarthria and left gaze nystagmus  MRI - Punctate focus of diffusion restriction within the right paramedian pons and additional small focus in the right brachium pontis   MRA - neck - moderate to severe right vertebral artery origin stenosis.  Carotid Doppler - Bilateral - 1% to 39% ICA stenosis. Vertebral artery flow is antegrade.  2D Echo - EF 60-65%. No cardiac source of emboli identified.  LDL - 82  HgbA1c - 5.9  VTE prophylaxis - subcutaneous heparin  Diet Carb Modified Fluid consistency: Thin; Room service appropriate? Yes  No antithrombotic prior to admission, now on aspirin 325 mg daily  Patient counseled to be compliant with her antithrombotic medications  Ongoing aggressive stroke risk factor management  Therapy recommendations: SNF  Disposition:  Pending  Hypertension  Blood pressure normal to low normal  Permissive hypertension (OK if < 220/120) but gradually normalize in 5-7 days  Long-term BP goal normotensive  Hyperlipidemia  Home meds:  Lipitor 20 mg daily resumed in hospital  LDL82, goal < 70  Consider increasing Lipitor to 40 mg daily.  Continue statin at discharge  Diabetes  HgbA1c 5.9, goal < 7.0  Controlled   Other Stroke Risk Factors  Advanced age  Hx stroke/TIA  Coronary  artery disease  Atrial flutter without anticoagulation secondary to history of GI bleed.   Other Active Problems  Anemia with leukopenia  Elevated creatinine 1.51 (history of chronic kidney disease)  Lesion in the right pons with mass effect could possibly represent metastatic or primary brain neoplasm and follow-up brain MRI with contrast is recommended to ensure resolution of diffusion abnormality.   Hospital day # 0 I have personally examined this patient, reviewed  notes, independently viewed imaging studies, participated in medical decision making and plan of care.ROS completed by me personally and pertinent positives fully documented  I have made any additions or clarifications directly to the above note. Agree with note above.She presented with 2 week h/o gait difficulty and numbness due to subacute right cerebellar and pontine lacunar infarcts though possibility of metastasis has also been raised. She also  has old lacunar infarcts and dementia at baseline. Prognosis is poor.Greater than 50% time during this 25 minute visit was spent on coordination of care about her stroke.Stroke team will sign off. D/W Dr Jomarie Longs and answered questions.  Delia Heady, MD Medical Director [facility] Pager: [phone] 09/10/2016 3:48 PM    To contact Stroke Continuity provider, please refer to [email or web address]. After hours, contact General Neurology

## 2016-09-10 NOTE — Progress Notes (Addendum)
[name] NHA:[id number] DOB: [birth year] DOA: 09/07/2016 PCP: Gwenyth Bender, MD  Brief narrative: 64 F Aflutter not on [facility] [previously xarelto] stopped after diverticular bleed  07/2015-Chad2Vasc2 8`-prior h/o bleed before in 2014 as well, [facility], Prior CVA 2014-initally was on Plavix, DM ty ii, prior CAD s/p PTCA 10/2/1/2, htn, mild dementia, ckd stg iii Rh arthritis admited with generalized weakness/worsening LLE weakness and debility Work-up concerning for possible subacute CVA  Neuro consulted  Assessment/Plan:  1. Subacute CVAs in R paramedian pons -noted on MRI, MRA with mod to severe R vertebral artery stenosis -Residual L leg weakness -? Cannot rule out mass in R pons based on this MRI, needs repeat imaging down the road -NEuro following, was on Xarelto for P.Afib and this was stopped in 8/16 after GI/Diverticular bleeding -on ASA 325mg  and Lipitor now -carotid duplex and ECHO within normal limits -Hbaic 5.9 and LDL 82 -PT/OT recommended SNF, CSW following -Stroke MD to FU today  2. P.Atrial flutter/fibrillation -in NSR now, continue metoprolol 25 twice a day and not on anticoagulation since GI bleed/diverticular in 8/16 was on Xarelto until then -? Restart Anticoagulation, defer to NEurology  3.  Diabetes mellitus type 2 - continue sliding scale insulin, home medications include Januvia and  Metformin, which can be restarted on discharge -stable  4.  Chronic kidney disease stage II to 3-baseline GFR is in the 40-45 range.  -Avoid nephrotoxins, stable  5. Dementia  -continue Aricept 10 mg daily at bedtime, Namenda 28 daily  -TSH and B12 ok  6.  Mild neutropenia noted this admission -needs outpatient monitoring if persists at present can just be monitored  7. Reflux- - pantoprazole 40 daily   Full Code No family at bedside Dispo: SNF when cleared by NEuro Consultants:  Neurology  Procedures:  MRI of the brain showing atherosclerosis  severe   Antibiotics:  None at present   Subjective  Feels ok, no complaints    Objective    Interim History: None  Telemetry: Atrial flutter/fib rate controlled   Objective: Vitals:   09/09/16 0800 09/09/16 1417 09/09/16 2157 09/10/16 0546  BP:  (!) 105/50 (!) 144/69 131/65  Pulse:  69 69 64  Resp:  18 18 18   Temp:  98.1 F (36.7 C) 97.6 F (36.4 C) 97.5 F (36.4 C)  TempSrc:  Oral    SpO2:  98% 100% 98%  Weight: 52.2 kg (115 lb)     Height:        Intake/Output Summary (Last 24 hours) at 09/10/16 1218 Last data filed at 09/10/16 1219  Gross per 24 hour  Intake              360 ml  Output                0 ml  Net              360 ml    Exam:  General: Alert pleasant oriented but does not recall last 24 hours clearly Cardiovascular: S1-S2 no murmur rub or gallop irregularly irregular but rate controlled Respiratory: Chest is clinically clear no added sound Abdomen: Abdomen is soft nontender nondistended no rebound no guarding Skin no lower extremity edema no rash Neuro grossly intact able to move all 4 extremities there is limitation of movement on the left lower extremity with straight leg raising test actively and she has some pain in her lower back on axial rotation of the spine  Data Reviewed: Basic  Metabolic Panel:  Recent Labs Lab 09/07/16 1944 09/08/16 0803 09/09/16 0521 09/10/16 0620  NA 139 140 139 140  K 3.7 4.6 4.0 4.4  CL 108 110 109 106  CO2 25 24 25 28   GLUCOSE 155* 99 104* 104*  BUN 16 15 16 19   CREATININE 1.47* 1.26* 1.37* 1.51*  CALCIUM 9.2 9.3 9.1 9.5   Liver Function Tests:  Recent Labs Lab 09/07/16 1944 09/09/16 0521  AST 20 19  ALT 11* 11*  ALKPHOS 78 72  BILITOT 0.7 0.6  PROT 6.3* 6.3*  ALBUMIN 2.8* 2.6*   No results for input(s): LIPASE, AMYLASE in the last 168 hours. No results for input(s): AMMONIA in the last 168 hours. CBC:  Recent Labs Lab 09/07/16 1944 09/08/16 0803 09/09/16 0521  WBC 2.8* 3.0*  3.0*  NEUTROABS  --  0.4* 0.4*  HGB 11.7* 11.1* 10.8*  HCT 36.7 34.7* 33.0*  MCV 81.0 80.5 80.1  PLT 168 200 174   Cardiac Enzymes:  Recent Labs Lab 09/08/16 1628  CKTOTAL 20*   BNP: Invalid input(s): POCBNP CBG:  Recent Labs Lab 09/09/16 2155 09/10/16 0212 09/10/16 0650 09/10/16 0751 09/10/16 1150  GLUCAP 108* 114* 109* 97 82    Recent Results (from the past 240 hour(s))  Urine culture     Status: Abnormal   Collection Time: 09/01/16 10:52 AM  Result Value Ref Range Status   Specimen Description URINE, RANDOM  Final   Special Requests NONE  Final   Culture MULTIPLE SPECIES PRESENT, SUGGEST RECOLLECTION (A)  Final   Report Status 09/02/2016 FINAL  Final     Studies:              All Imaging reviewed and is as per above notation   Scheduled Meds: . aspirin  325 mg Oral Daily  . atorvastatin  20 mg Oral BH-q7a  . donepezil  10 mg Oral QHS  . feeding supplement (ENSURE ENLIVE)  237 mL Oral BID BM  . heparin  5,000 Units Subcutaneous Q8H  . insulin aspart  0-9 Units Subcutaneous TID WC  . memantine  28 mg Oral Daily  . metoprolol tartrate  25 mg Oral BID  . [START ON 09/11/2016] thiamine  100 mg Oral Daily   Continuous Infusions:       11/02/2016, MD  [facility] Pager [phone] 09/10/2016, 12:18 PM    LOS: 0 days

## 2016-09-11 ENCOUNTER — Encounter (HOSPITAL_COMMUNITY): Payer: Self-pay | Admitting: *Deleted

## 2016-09-11 DIAGNOSIS — R531 Weakness: Secondary | ICD-10-CM | POA: Diagnosis not present

## 2016-09-11 DIAGNOSIS — I638 Other cerebral infarction: Secondary | ICD-10-CM | POA: Diagnosis not present

## 2016-09-11 LAB — BASIC METABOLIC PANEL
Anion gap: 8 (ref 5–15)
BUN: 22 mg/dL — AB (ref 6–20)
CO2: 24 mmol/L (ref 22–32)
CREATININE: 1.4 mg/dL — AB (ref 0.44–1.00)
Calcium: 9.3 mg/dL (ref 8.9–10.3)
Chloride: 109 mmol/L (ref 101–111)
GFR, EST AFRICAN AMERICAN: 41 mL/min — AB (ref >60)
GFR, EST NON AFRICAN AMERICAN: 35 mL/min — AB (ref >60)
Glucose, Bld: 116 mg/dL — ABNORMAL HIGH (ref 65–99)
POTASSIUM: 4.3 mmol/L (ref 3.5–5.1)
SODIUM: 141 mmol/L (ref 135–145)

## 2016-09-11 LAB — GLUCOSE, CAPILLARY
GLUCOSE-CAPILLARY: 113 mg/dL — AB (ref 65–99)
Glucose-Capillary: 167 mg/dL — ABNORMAL HIGH (ref 65–99)

## 2016-09-11 LAB — VITAMIN E: Alpha-Tocopherol: 10.9 mg/L (ref 6.5–21.5)

## 2016-09-11 MED ORDER — ASPIRIN 325 MG PO TABS: 325.0000 mg | ORAL_TABLET | Freq: Every day | ORAL | Status: AC

## 2016-09-11 MED ORDER — TRAMADOL HCL 50 MG PO TABS: 50.0000 mg | ORAL_TABLET | Freq: Three times a day (TID) | ORAL | 0 refills | Status: DC | PRN

## 2016-09-11 MED ORDER — TRAMADOL HCL 50 MG PO TABS: 50.0000 mg | ORAL_TABLET | Freq: Three times a day (TID) | ORAL | 0 refills | Status: AC | PRN

## 2016-09-11 NOTE — Progress Notes (Signed)
[facility]: [facility] Anticipated DC date: 09/11/16 Family notified: Son, Homero Fellers, by phone Transport by: Sharin Mons 3:30pm   Per MD patient ready for DC to [facility]. RN, patient, patient's family, and facility notified of DC. Discharge Summary sent to facility. RN given number for report. DC packet on chart. Ambulance transport requested for patient.   CSW signing off.  Cristobal Goldmann, [place] Clinical Social Worker [phone]

## 2016-09-11 NOTE — Progress Notes (Signed)
Attempted to call report to [facility] at 15:00. Held for 20 mins and nurse never came to the phone.  Attempted again 16:30. Put on hold again for 10 mins and then was disconnected.

## 2016-09-11 NOTE — Discharge Summary (Signed)
Physician Discharge Summary  [name] ZOX:[id number] DOB: Jul 14, 1936 DOA: 09/07/2016  PCP: Gwenyth Bender, MD  Admit date: 09/07/2016 Discharge date: 09/11/2016  Time spent: 45 minutes  Recommendations for Outpatient Follow-up:  Dr.Sethi in 1 month, please consider Fu MRI brain to re-assess the R pons PCP in 1 week  Discharge Diagnoses:    CVA-subacute   Dementia   Debility   DM (diabetes mellitus) (HCC)   Hyperlipidemia   Essential hypertension   Arthritis   H/o Atrial flutter (HCC)   CKD (chronic kidney disease) stage 3, GFR 30-59 ml/min   Dysarthria   Cerebral thrombosis with cerebral infarction   Discharge Condition: stable  Diet recommendation: heart healthy diabetic  Filed Weights   09/09/16 0800  Weight: 52.2 kg (115 lb)    History of present illness:  81 F Aflutter not on AC [previously xarelto] stopped after diverticular bleed  07/2015-[facility]2Vasc2 8`-prior h/o bleed before in 2014 as well, [facility], Prior CVA 2014-initally was on Plavix, DM ty ii, prior CAD s/p PTCA 10/2/1/2, htn, mild dementia, ckd stg iii Rh arthritis admited with generalized weakness/worsening LLE weakness and debility, symptoms started a week prior to admission  Hospital Course:  1. Subacute CVAs in R paramedian pons -she was noted to have increasing weakness x 1 week prior to admission especially on Left side/L leg -Right paramedian subacute CVA noted on MRI, MRA with mod to severe R vertebral artery stenosis -Residual L leg weakness -? Cannot rule out mass in R pons based on this MRI, needs repeat imaging down the road -NEuro consulted, was on Xarelto for P.Afib and this was stopped in 8/16 after GI/Diverticular bleeding -started on ASA 325mg  on admission, per Dr.Sethi thia was a small vessel infarct not embolic and continued on Lipitor  -carotid duplex and ECHO within normal limits -Hbaic 5.9 and LDL 82 -PT/OT recommended SNF, she will be discharged to SNF for rehab and FU with  Neurology Dr.Sethi in 1 month and consider repeat MRI to re-evaluate the pons to r/o questionable lesion  2. P.Atrial flutter/fibrillation -in NSR now, continue metoprolol 25mg  and not on anticoagulation since GI bleed/diverticular in 8/16 was on Xarelto until then -now started on ASA for small vessel CVA, defer to PCP/Neurology at FU to determine risk/benefit for re-trial of anticoagulation  3.  Diabetes mellitus type 2 - resume home medications-Januvia and  Metformin -stable  4.  Chronic kidney disease stage II to 3-baseline GFR is in the 40-45 range.  -stable  5. Dementia  -continue Aricept 10 mg daily at bedtime, Namenda 28 daily  -TSH and B12 ok  6.  Mild neutropenia noted this admission -needs outpatient monitoring  7. Reflux- - pantoprazole 40 daily   Consultations:  Neurology Dr.Sethi  Discharge Exam: Vitals:   09/10/16 2145 09/11/16 0549  BP: 132/61 (!) 149/65  Pulse: 74 69  Resp: 18 18  Temp: 98.3 F (36.8 C) 97.5 F (36.4 C)    General: alert, awake, oriented to self and place only Cardiovascular: S1S2/RRR Respiratory: CTAB  Discharge Instructions   Discharge Instructions    Diet - low sodium heart healthy    Complete by:  As directed    Diet Carb Modified    Complete by:  As directed    Increase activity slowly    Complete by:  As directed      Current Discharge Medication List    START taking these medications   Details  aspirin 325 MG tablet Take 1 tablet (325  mg total) by mouth daily.      CONTINUE these medications which have CHANGED   Details  traMADol (ULTRAM) 50 MG tablet Take 1 tablet (50 mg total) by mouth every 8 (eight) hours as needed for moderate pain or severe pain. Qty: 30 tablet, Refills: 0      CONTINUE these medications which have NOT CHANGED   Details  atorvastatin (LIPITOR) 20 MG tablet Take 20 mg by mouth every morning.     IRON PO Take 1 capsule by mouth daily.    Memantine HCl-Donepezil HCl (NAMZARIC)  28-10 MG CP24 Take 1 capsule by mouth at bedtime.    metFORMIN (GLUCOPHAGE) 500 MG tablet Take 500 mg by mouth 2 (two) times daily with a meal.    metoprolol succinate (TOPROL-XL) 25 MG 24 hr tablet Take 25 mg by mouth daily.    oxybutynin (DITROPAN-XL) 5 MG 24 hr tablet Take 5 mg by mouth every morning.     sitaGLIPtin (JANUVIA) 100 MG tablet Take 100 mg by mouth every morning.     Vitamin D, Ergocalciferol, (DRISDOL) 50000 units CAPS capsule Take 50,000 Units by mouth every Sunday.    nitroGLYCERIN (NITROSTAT) 0.4 MG SL tablet Place 0.4 mg under the tongue every 5 (five) minutes as needed for chest pain. x3 doses as needed for chest pain      STOP taking these medications     cephALEXin (KEFLEX) 500 MG capsule      metoprolol tartrate (LOPRESSOR) 25 MG tablet        Allergies  Allergen Reactions  . Codeine Nausea Only and Other (See Comments)    REACTION: GI upset   Follow-up Information    SETHI,PRAMOD, MD. Schedule an appointment as soon as possible for a visit in 1 month(s).   Specialties:  Neurology, Radiology Why:  consider FU MRI at Follow Up Contact information: [address] [place] [address] [phone]            The results of significant diagnostics from this hospitalization (including imaging, microbiology, ancillary and laboratory) are listed below for reference.    Significant Diagnostic Studies: Dg Lumbar Spine 2-3 Views  Result Date: 09/08/2016 CLINICAL DATA:  Low back pain EXAM: LUMBAR SPINE - 2-3 VIEW COMPARISON:  08/14/2012 FINDINGS: Five lumbar-type vertebral bodies. Mild straightening of the normal lumbar lordosis. Mild lumbar dextroscoliosis. Status post L4-5 PLIF, without evidence of hardware complication. No evidence of fracture or dislocation. Vertebral body heights are maintained. Moderate multilevel degenerative changes, most prominent at L2-3 and L5-S1. Visualized bony pelvis appears intact. Right hip arthroplasty,  without evidence of complication. IMPRESSION: No fracture or dislocation is seen. Status post L4-5 PLIF, without evidence of complication. Moderate multilevel degenerative changes. Electronically Signed   By: Charline Bills M.D.   On: 09/08/2016 15:11   Ct Head Wo Contrast  Result Date: 09/07/2016 CLINICAL DATA:  Generalized weakness and left lower leg weakness for 2 weeks. Dysarthria. EXAM: CT HEAD WITHOUT CONTRAST TECHNIQUE: Contiguous axial images were obtained from the base of the skull through the vertex without intravenous contrast. COMPARISON:  Head CT and MRI 11/03/2013 FINDINGS: Brain: There is a new lacunar infarct involving the right caudate and internal capsule which is chronic in appearance. A small chronic left parietal cortical infarct is noted, acute on the prior MRI. A chronic lacunar infarct is again seen in the left thalamus, with likely a chronic right thalamic lacunar infarct as well. Subcortical and periventricular white matter hypodensities have mildly progressed from  the prior CT and are compatible with moderate chronic small vessel ischemic disease. There is likely a small chronic infarct in the pons. No definite acute cortically based infarct, intracranial hemorrhage, mass, midline shift, or extra-axial fluid collection is seen. There is moderate cerebral atrophy. Vascular: Focal density of the right MCA in the sylvian fissure in the expected region of the MCA bifurcation, also asymmetrically dense on the prior CT although slightly more prominent on the current examination. Mild carotid siphon calcification. Skull: No skull fracture or aggressive osseous lesion. Sinuses/Orbits: Moderate left and trace right mastoid effusions. Prior bilateral cataract extraction. Clear sinuses. Other: None. IMPRESSION: 1. No intracranial hemorrhage or definite acute infarct. 2. Hyperattenuation of the right MCA in the region of the MCA bifurcation, favored to reflect atherosclerosis rather than acute  thrombus given duration of patient's symptoms and lack of CT evidence of acute MCA infarct. 3. Moderate chronic ischemic changes as above. Electronically Signed   By: Sebastian Ache M.D.   On: 09/07/2016 23:47   Mr Angiogram Neck W Wo Contrast  Result Date: 09/09/2016 CLINICAL DATA:  80 year old female with generalized weakness. Probable small subacute brainstem infarcts on the right on brain MRI yesterday. Initial encounter. EXAM: MRA NECK WITHOUT AND WITH CONTRAST TECHNIQUE: Multiplanar and multiecho pulse sequences of the neck were obtained without and with intravenous contrast. Angiographic images of the neck were obtained using MRA technique without and with intravenous contrast. CONTRAST:  6 mm MultiHance COMPARISON:  Brain MRI and cervical spine MRI 09/08/2016. Intracranial MRA 11/03/2013 FINDINGS: Precontrast time-of-flight imaging reveals antegrade flow in the bilateral carotid and vertebral arteries throughout the neck and to the skullbase. Post-contrast neck MRA images reveal a 3 vessel arch configuration. No great vessel origin stenosis. Mildly tortuous right CCA. Widely patent right carotid bifurcation. Negative cervical right ICA aside from mild to moderate tortuosity. Mild irregularity in the proximal left CCA which does not appear hemodynamically significant. Mildly tortuous left CCA. Widely patent left carotid bifurcation. Negative cervical left ICA aside from mild tortuosity. No proximal right subclavian artery stenosis. Moderate to severe stenosis at the right vertebral artery origin (series 603, image 2). Mild right vertebral artery tortuosity with no right vertebral artery stenosis. Patent vertebrobasilar junction and basilar artery. Patent right PICA origin. Normal left vertebral artery origin. Tortuous left V1 segment. No left vertebral artery stenosis. Patent left PICA origin. Patent left vertebrobasilar junction. IMPRESSION: 1. Moderate to severe right vertebral artery origin stenosis.  Otherwise negative vertebral arteries and visualized posterior circulation. 2. No cervical carotid artery stenosis. Electronically Signed   By: Odessa Fleming M.D.   On: 09/09/2016 19:57   Mr Brain Wo Contrast  Result Date: 09/08/2016 CLINICAL DATA:  80 y/o F; generalized weakness with decreased sensation of the left lower extremity greater than the right. EXAM: MRI HEAD WITHOUT CONTRAST MRI CERVICAL SPINE WITHOUT CONTRAST TECHNIQUE: Multiplanar, multiecho pulse sequences of the brain and surrounding structures, and cervical spine, to include the craniocervical junction and cervicothoracic junction, were obtained without intravenous contrast. COMPARISON:  09/07/2016 CT head. FINDINGS: MRI HEAD FINDINGS Brain: Focus of diffusion restriction within the right paramedian pons and additional separate focus within the right brachium pontis. The lesion in the right brachium pontis is associated with mild mass effect. There are several punctate foci of susceptibility hypointensity in the bilateral cerebellar hemispheres and bilateral posterior temporal lobes as well as the frontal periventricular white matter likely representing hemosiderin deposition from prior micro hemorrhage. There are multiple small chronic infarcts within subcortical and  periventricular white matter of the frontal and parietal lobes with a background of moderate chronic microvascular ischemic changes and parenchymal volume loss. Additional tiny lacunar infarcts are present in the right caudate head, bilateral thalamus, and left cerebellar hemisphere. Vascular: Normal flow voids. Skull and upper cervical spine: Normal marrow signal. Sinuses/Orbits: No abnormal signal of paranasal sinuses or orbits. Bilateral intra-ocular lens replacement. Partial left greater than right mastoid opacification with fluid levels. Other: None. MRI CERVICAL SPINE FINDINGS Alignment: Straightening of cervical lordosis. Minimal C4-5 anterolisthesis, C6-7 retrolisthesis, and  grade 1 C7-T1 anterolisthesis. Vertebrae: No fracture, evidence of discitis, or bone lesion. Cord: Normal signal and morphology. Posterior Fossa, vertebral arteries, paraspinal tissues: Posterior fossa better assessed and MRI brain findings above. No prevertebral soft tissue swelling or effusion. No abnormality of paraspinal muscles. Negative noncontrast MR soft tissues of neck. Disc levels: Multilevel disc desiccation and disc space narrowing. Interbody fusion of C5-6 is likely degenerative. C2-3: Disc osteophyte complex and right greater than left uncovertebral/ facet hypertrophy with moderate right and mild left foraminal narrowing. C3-4: Disc osteophyte complex and right greater than left uncovertebral/ facet hypertrophy. Mild canal stenosis. Moderate right and mild left foraminal narrowing. C4-5: Disc osteophyte complex eccentric to right subarticular zone and right greater than left uncovertebral and facet hypertrophy. Moderate to severe right and mild left foraminal narrowing. Moderate canal stenosis with mild right anterior cord impingement and cord flattening. C5-6: Interbody fusion and moderate posterior osteophyte combine with extensive bilateral uncovertebral and facet hypertrophy. Moderate canal stenosis with mild anterior cord impingement and flattening. Severe right and moderate to severe left foraminal narrowing. C6-7: Disc osteophyte complex with prominent left central ossific ridging and bilateral uncovertebral and facet hypertrophy. There is severe canal stenosis with anterior cord impingement and flattening. Severe left and moderate to severe right foraminal narrowing. C7-T1: Large disc osteophyte complex, uncovered disc, and extensive ligamentum flavum and facet hypertrophy. Severe canal stenosis with anterior cord impingement and flattening. Severe bilateral foraminal narrowing. : 1. Punctate focus of diffusion restriction within the right paramedian pons and additional small focus in the right  brachium pontis with mild associated mass effect probably represent subacute infarctions. The lesion in the right pons with mass effect could possibly represent metastatic or primary brain neoplasm and follow-up brain MRI with contrast is recommended to ensure resolution of diffusion abnormality. 2. Moderate chronic microvascular ischemic changes and parenchymal volume loss of the brain. Multiple small chronic infarcts in white matter and basal ganglia. 3. Nonspecific bilateral mastoid effusions may be related to Eustachian obstruction. 4. Extensive degenerative changes of the cervical spine with multilevel canal stenosis moderate from C4 through C6 and severe at the C6-7 level. Additionally there is moderate to severe foraminal narrowing at those levels. No cord compression or abnormal cord signal is identified. These results will be called to the ordering clinician or representative by the Radiologist Assistant, and communication documented in the PACS or zVision Dashboard. Electronically Signed   By: Mitzi HansenLance  Furusawa-Stratton M.D.   On: 09/08/2016 22:31   Mr Cervical Spine Wo Contrast  Result Date: 09/08/2016 CLINICAL DATA:  80 y/o F; generalized weakness with decreased sensation of the left lower extremity greater than the right. EXAM: MRI HEAD WITHOUT CONTRAST MRI CERVICAL SPINE WITHOUT CONTRAST TECHNIQUE: Multiplanar, multiecho pulse sequences of the brain and surrounding structures, and cervical spine, to include the craniocervical junction and cervicothoracic junction, were obtained without intravenous contrast. COMPARISON:  09/07/2016 CT head. FINDINGS: MRI HEAD FINDINGS Brain: Focus of diffusion restriction within the right  paramedian pons and additional separate focus within the right brachium pontis. The lesion in the right brachium pontis is associated with mild mass effect. There are several punctate foci of susceptibility hypointensity in the bilateral cerebellar hemispheres and bilateral posterior  temporal lobes as well as the frontal periventricular white matter likely representing hemosiderin deposition from prior micro hemorrhage. There are multiple small chronic infarcts within subcortical and periventricular white matter of the frontal and parietal lobes with a background of moderate chronic microvascular ischemic changes and parenchymal volume loss. Additional tiny lacunar infarcts are present in the right caudate head, bilateral thalamus, and left cerebellar hemisphere. Vascular: Normal flow voids. Skull and upper cervical spine: Normal marrow signal. Sinuses/Orbits: No abnormal signal of paranasal sinuses or orbits. Bilateral intra-ocular lens replacement. Partial left greater than right mastoid opacification with fluid levels. Other: None. MRI CERVICAL SPINE FINDINGS Alignment: Straightening of cervical lordosis. Minimal C4-5 anterolisthesis, C6-7 retrolisthesis, and grade 1 C7-T1 anterolisthesis. Vertebrae: No fracture, evidence of discitis, or bone lesion. Cord: Normal signal and morphology. Posterior Fossa, vertebral arteries, paraspinal tissues: Posterior fossa better assessed and MRI brain findings above. No prevertebral soft tissue swelling or effusion. No abnormality of paraspinal muscles. Negative noncontrast MR soft tissues of neck. Disc levels: Multilevel disc desiccation and disc space narrowing. Interbody fusion of C5-6 is likely degenerative. C2-3: Disc osteophyte complex and right greater than left uncovertebral/ facet hypertrophy with moderate right and mild left foraminal narrowing. C3-4: Disc osteophyte complex and right greater than left uncovertebral/ facet hypertrophy. Mild canal stenosis. Moderate right and mild left foraminal narrowing. C4-5: Disc osteophyte complex eccentric to right subarticular zone and right greater than left uncovertebral and facet hypertrophy. Moderate to severe right and mild left foraminal narrowing. Moderate canal stenosis with mild right anterior cord  impingement and cord flattening. C5-6: Interbody fusion and moderate posterior osteophyte combine with extensive bilateral uncovertebral and facet hypertrophy. Moderate canal stenosis with mild anterior cord impingement and flattening. Severe right and moderate to severe left foraminal narrowing. C6-7: Disc osteophyte complex with prominent left central ossific ridging and bilateral uncovertebral and facet hypertrophy. There is severe canal stenosis with anterior cord impingement and flattening. Severe left and moderate to severe right foraminal narrowing. C7-T1: Large disc osteophyte complex, uncovered disc, and extensive ligamentum flavum and facet hypertrophy. Severe canal stenosis with anterior cord impingement and flattening. Severe bilateral foraminal narrowing. : 1. Punctate focus of diffusion restriction within the right paramedian pons and additional small focus in the right brachium pontis with mild associated mass effect probably represent subacute infarctions. The lesion in the right pons with mass effect could possibly represent metastatic or primary brain neoplasm and follow-up brain MRI with contrast is recommended to ensure resolution of diffusion abnormality. 2. Moderate chronic microvascular ischemic changes and parenchymal volume loss of the brain. Multiple small chronic infarcts in white matter and basal ganglia. 3. Nonspecific bilateral mastoid effusions may be related to Eustachian obstruction. 4. Extensive degenerative changes of the cervical spine with multilevel canal stenosis moderate from C4 through C6 and severe at the C6-7 level. Additionally there is moderate to severe foraminal narrowing at those levels. No cord compression or abnormal cord signal is identified. These results will be called to the ordering clinician or representative by the Radiologist Assistant, and communication documented in the PACS or zVision Dashboard. Electronically Signed   By: Mitzi Hansen M.D.    On: 09/08/2016 22:31    Microbiology: No results found for this or any previous visit (from the past 240  hour(s)).   Labs: Basic Metabolic Panel:  Recent Labs Lab 09/07/16 1944 09/08/16 0803 09/09/16 0521 09/10/16 0620 09/11/16 0912  NA 139 140 139 140 141  K 3.7 4.6 4.0 4.4 4.3  CL 108 110 109 106 109  CO2 25 24 25 28 24   GLUCOSE 155* 99 104* 104* 116*  BUN 16 15 16 19  22*  CREATININE 1.47* 1.26* 1.37* 1.51* 1.40*  CALCIUM 9.2 9.3 9.1 9.5 9.3   Liver Function Tests:  Recent Labs Lab 09/07/16 1944 09/09/16 0521  AST 20 19  ALT 11* 11*  ALKPHOS 78 72  BILITOT 0.7 0.6  PROT 6.3* 6.3*  ALBUMIN 2.8* 2.6*   No results for input(s): LIPASE, AMYLASE in the last 168 hours. No results for input(s): AMMONIA in the last 168 hours. CBC:  Recent Labs Lab 09/07/16 1944 09/08/16 0803 09/09/16 0521  WBC 2.8* 3.0* 3.0*  NEUTROABS  --  0.4* 0.4*  HGB 11.7* 11.1* 10.8*  HCT 36.7 34.7* 33.0*  MCV 81.0 80.5 80.1  PLT 168 200 174   Cardiac Enzymes:  Recent Labs Lab 09/08/16 1628  CKTOTAL 20*   BNP: BNP (last 3 results) No results for input(s): BNP in the last 8760 hours.  ProBNP (last 3 results) No results for input(s): PROBNP in the last 8760 hours.  CBG:  Recent Labs Lab 09/10/16 1150 09/10/16 1654 09/10/16 2143 09/11/16 0749 09/11/16 1227  GLUCAP 82 136* 116* 113* 167*       SignedZannie Cove MD.  [facility] 09/11/2016, 1:21 PM

## 2016-09-11 NOTE — Progress Notes (Signed)
Physical Therapy Treatment Patient Details Name: [name] MRN: [id number] DOB: 12-21-1936 Today's Date: 09/11/2016    History of Present Illness pt is a 80 y/o female with pmh of paroxysmal A flutter, previous stroke, DM2, HTN, mild dementia, RA, presenting with generalized weakness for 2 weeks.  pt reports weakness and sensation worse on the left.  MRI shows watershed type stroke scattered front to back in left hemisphere.    PT Comments    Pt very pleasant and willing to work with therapy. Pt with c/o back pain during mobility. Plan for d/c to SNF.  Follow Up Recommendations  SNF     Equipment Recommendations  None recommended by PT    Recommendations for Other Services       Precautions / Restrictions Precautions Precautions: Fall Restrictions Weight Bearing Restrictions: No    Mobility  Bed Mobility     Rolling: Max assist   Supine to sit: +2 for physical assistance;HOB elevated;Total assist        Transfers   Equipment used: None       Squat pivot transfers: +2 physical assistance;Total assist     General transfer comment: Therapist blocked R knee for pivot transfer toward R  Ambulation/Gait             General Gait Details: nonambulatory   Stairs            Wheelchair Mobility    Modified Rankin (Stroke Patients Only) Modified Rankin (Stroke Patients Only) Pre-Morbid Rankin Score: Moderate disability Modified Rankin: Severe disability     Balance   Sitting-balance support: Bilateral upper extremity supported;Feet supported Sitting balance-Leahy Scale: Poor     Standing balance support: Bilateral upper extremity supported;During functional activity Standing balance-Leahy Scale: Zero                      Cognition Arousal/Alertness: Awake/alert Behavior During Therapy: WFL for tasks assessed/performed Overall Cognitive Status: No family/caregiver present to determine baseline cognitive functioning Area of  Impairment: Memory;Safety/judgement     Memory: Decreased short-term memory   Safety/Judgement: Decreased awareness of safety;Decreased awareness of deficits          Exercises      General Comments        Pertinent Vitals/Pain Pain Assessment: Faces Faces Pain Scale: Hurts whole lot Pain Location: back during mobility Pain Descriptors / Indicators: Moaning;Grimacing;Guarding Pain Intervention(s): Monitored during session;Repositioned    Home Living                      Prior Function            PT Goals (current goals can now be found in the care plan section) Acute Rehab PT Goals Patient Stated Goal: not stated PT Goal Formulation: Patient unable to participate in goal setting Time For Goal Achievement: 09/22/16 Potential to Achieve Goals: Fair Progress towards PT goals: Progressing toward goals    Frequency    Min 3X/week      PT Plan Current plan remains appropriate    Co-evaluation             End of Session Equipment Utilized During Treatment: Gait belt Activity Tolerance: Patient tolerated treatment well Patient left: in chair;with chair alarm set;with call bell/phone within reach     Time: 1030-1055 PT Time Calculation (min) (ACUTE ONLY): 25 min  Charges:  $Therapeutic Activity: 23-37 mins  G Codes:  Functional Assessment Tool Used: clinical observation Functional Limitation: Mobility: Walking and moving around Mobility: Walking and Moving Around Current Status [phone]): At least 80 percent but less than 100 percent impaired, limited or restricted Mobility: Walking and Moving Around Goal Status [phone]): At least 60 percent but less than 80 percent impaired, limited or restricted   Ilda Foil 09/11/2016, 11:29 AM

## 2016-09-11 NOTE — Clinical Social Work Placement (Signed)
   CLINICAL SOCIAL WORK PLACEMENT  NOTE  Date:  09/11/2016  Patient Details  Name: [name] MRN: [id number] Date of Birth: 06/17/1936  Clinical Social Work is seeking post-discharge placement for this patient at the Skilled  Nursing Facility level of care (*CSW will initial, date and re-position this form in  chart as items are completed):  Yes   Patient/family provided with [facility] Social Work Department's list of facilities offering this level of care within the geographic area requested by the patient (or if unable, by the patient's family).  Yes   Patient/family informed of their freedom to choose among providers that offer the needed level of care, that participate in Medicare, Medicaid or managed care program needed by the patient, have an available bed and are willing to accept the patient.  Yes   Patient/family informed of [facility]'s ownership interest in [facility] and [facility], as well as of the fact that they are under no obligation to receive care at these facilities.  PASRR submitted to EDS on       PASRR number received on       Existing PASRR number confirmed on 09/08/16     FL2 transmitted to all facilities in geographic area requested by pt/family on 09/08/16     FL2 transmitted to all facilities within larger geographic area on       Patient informed that his/her managed care company has contracts with or will negotiate with certain facilities, including the following:        Yes   Patient/family informed of bed offers received.  Patient chooses bed at [facility]     Physician recommends and patient chooses bed at      Patient to be transferred to [facility] on 09/11/16.  Patient to be transferred to facility by [facility]     Patient family notified on 09/11/16 of transfer.  Name of family member notified:  Geoff     PHYSICIAN Please sign FL2     Additional Comment:     _______________________________________________ Mearl Latin, LCSWA 09/11/2016, 1:41 PM

## 2016-09-11 NOTE — Progress Notes (Signed)
Patient's son to do paperwork at [facility] at 1:30pm today for discharge planning. Patient will be transported by [facility].  Osborne Casco Lisa-Marie Rueger LCSWA [phone]

## 2016-09-11 NOTE — Progress Notes (Signed)
Nsg Discharge Note  Admit Date:  09/07/2016 Discharge date: 09/11/2016   Teodora Medici to be D/C'd Skilled nursing facility per MD order.  AVS completed.  Copy for chart, and copy for patient signed, and dated. Patient/caregiver able to verbalize understanding.  Discharge Medication:   Medication List    STOP taking these medications   cephALEXin 500 MG capsule Commonly known as:  KEFLEX     TAKE these medications   aspirin 325 MG tablet Take 1 tablet (325 mg total) by mouth daily. Start taking on:  09/12/2016   atorvastatin 20 MG tablet Commonly known as:  LIPITOR Take 20 mg by mouth every morning.   IRON PO Take 1 capsule by mouth daily.   metFORMIN 500 MG tablet Commonly known as:  GLUCOPHAGE Take 500 mg by mouth 2 (two) times daily with a meal.   metoprolol succinate 25 MG 24 hr tablet Commonly known as:  TOPROL-XL Take 25 mg by mouth daily.   NAMZARIC 28-10 MG Cp24 Generic drug:  Memantine HCl-Donepezil HCl Take 1 capsule by mouth at bedtime.   nitroGLYCERIN 0.4 MG SL tablet Commonly known as:  NITROSTAT Place 0.4 mg under the tongue every 5 (five) minutes as needed for chest pain. x3 doses as needed for chest pain   oxybutynin 5 MG 24 hr tablet Commonly known as:  DITROPAN-XL Take 5 mg by mouth every morning.   sitaGLIPtin 100 MG tablet Commonly known as:  JANUVIA Take 100 mg by mouth every morning.   traMADol 50 MG tablet Commonly known as:  ULTRAM Take 1 tablet (50 mg total) by mouth every 8 (eight) hours as needed for moderate pain or severe pain.   Vitamin D (Ergocalciferol) 50000 units Caps capsule Commonly known as:  DRISDOL Take 50,000 Units by mouth every Sunday.       Discharge Assessment: Vitals:   09/10/16 2145 09/11/16 0549  BP: 132/61 (!) 149/65  Pulse: 74 69  Resp: 18 18  Temp: 98.3 F (36.8 C) 97.5 F (36.4 C)   Skin clean, dry and intact without evidence of skin break down, no evidence of skin tears noted. IV catheter  discontinued intact. Site without signs and symptoms of complications - no redness or edema noted at insertion site, patient denies c/o pain - only slight tenderness at site.  Dressing with slight pressure applied.  D/c Instructions-Education: Discharge instructions given to patient/family with verbalized understanding. D/c education completed with patient/family including follow up instructions, medication list, d/c activities limitations if indicated, with other d/c instructions as indicated by MD - patient able to verbalize understanding, all questions fully answered. Patient instructed to return to ED, call 911, or call MD for any changes in condition.  Patient transported via [facility] to [facility].  Attempted report twice see previous note.   Camillo Flaming, RN 09/11/2016 4:46 PM

## 2016-09-11 NOTE — Care Management Obs Status (Signed)
MEDICARE OBSERVATION STATUS NOTIFICATION   Patient Details  Name: [name] MRN: [id number] Date of Birth: [birth year]   Medicare Observation Status Notification Given:       Epifanio Lesches, RN 09/11/2016, 3:02 PM

## 2016-09-12 LAB — VITAMIN B1: Vitamin B1 (Thiamine): 84.8 nmol/L (ref 66.5–200.0)

## 2016-10-16 ENCOUNTER — Ambulatory Visit: Payer: Medicare PPO | Admitting: Neurology

## 2016-10-19 ENCOUNTER — Encounter: Payer: Self-pay | Admitting: Neurology

## 2016-11-24 DEATH — deceased

## 2017-08-24 IMAGING — MR MR MRA NECK WO/W CM
8 of 11 series · 25 of 48 positions shown · IV contrast (multihance)
Comparison: Brain MRI and cervical spine MRI 09/08/2016.
Intracranial MRA 11/03/2013

CLINICAL DATA: 79-year-old female with generalized weakness.
Probable small subacute brainstem infarcts on the right on brain MRI
yesterday. Initial encounter.

EXAM:
MRA NECK WITHOUT AND WITH CONTRAST
TECHNIQUE: Multiplanar and multiecho pulse sequences of the neck were obtained
without and with intravenous contrast. Angiographic images of the
neck were obtained using MRA technique without and with intravenous
contrast.
CONTRAST:  6 mm MultiHance

[Series 3: ax (id) fspgr · axial · 2.2mm · 0.94mm/px · z∈[-96,+180]mm · 6 of 198 slices shown]
[im 1/198]
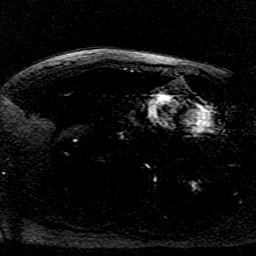
[im 40/198]
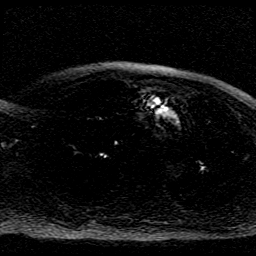
[im 79/198]
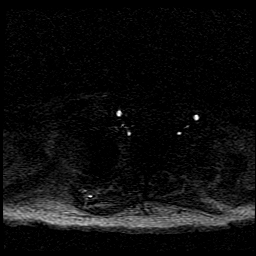
[im 119/198]
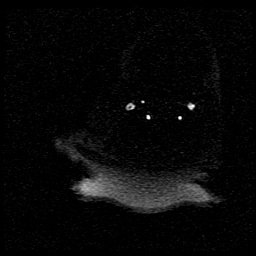
[im 158/198]
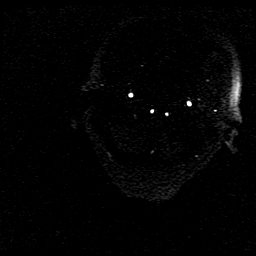
[im 198/198]
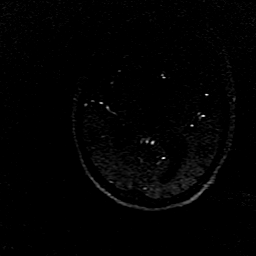

[Series 4: opt sag inhance · sagittal · 1.6mm · 0.66mm/px · 9 of 576 slices shown]
[im 31/576]
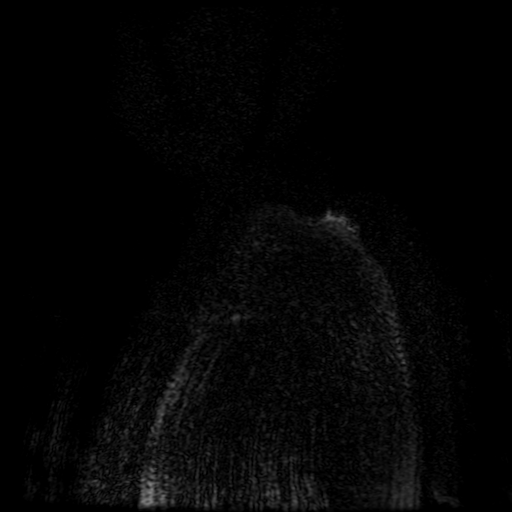
[im 91/576]
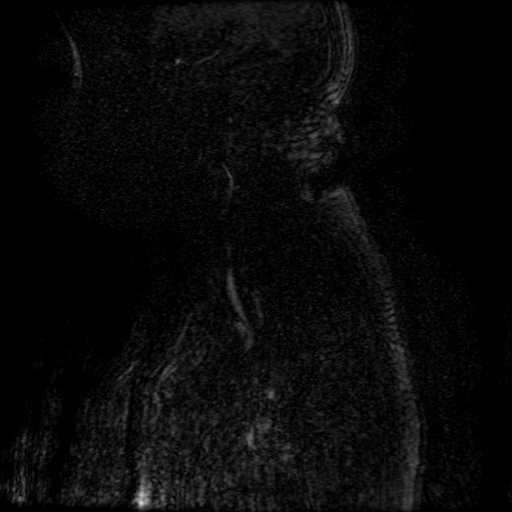
[im 182/576]
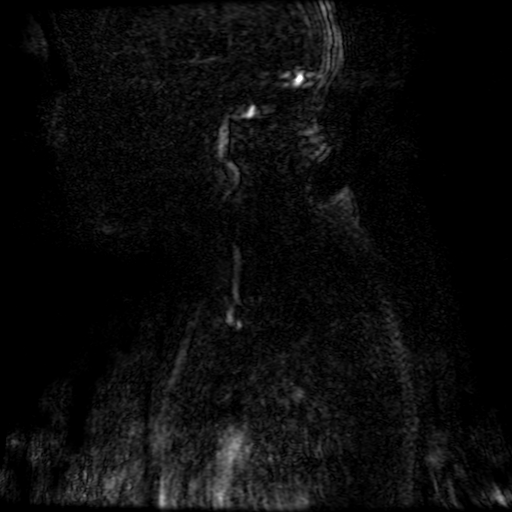
[im 243/576]
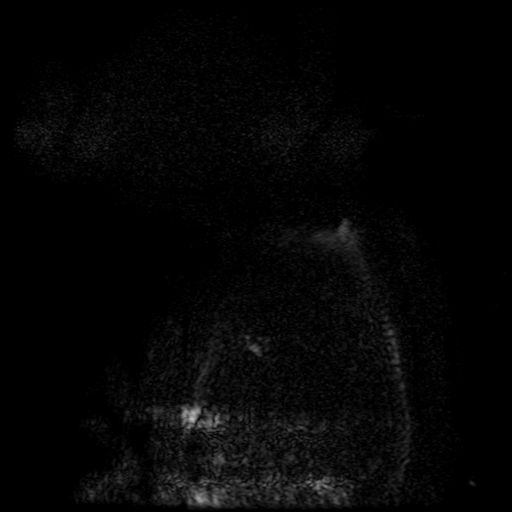
[im 303/576]
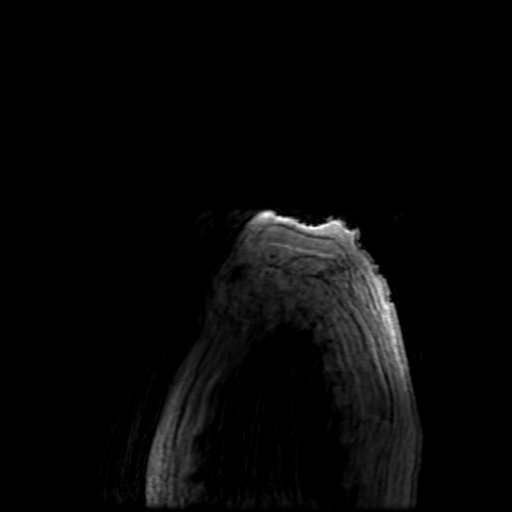
[im 333/576]
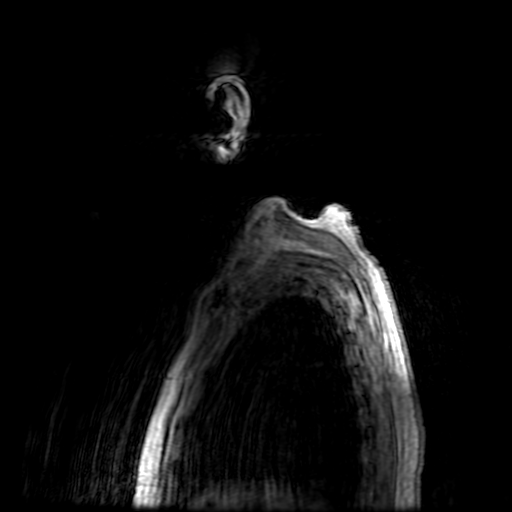
[im 394/576]
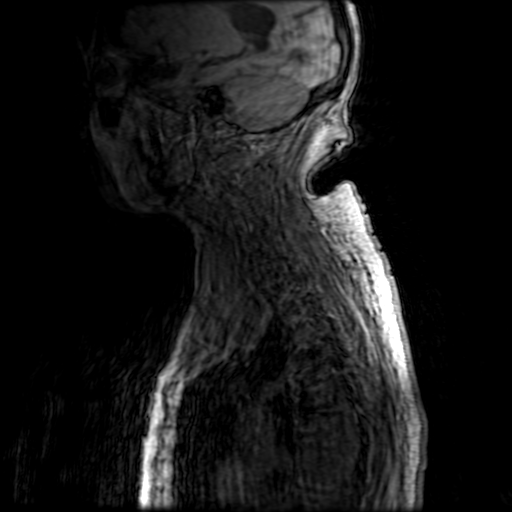
[im 485/576]
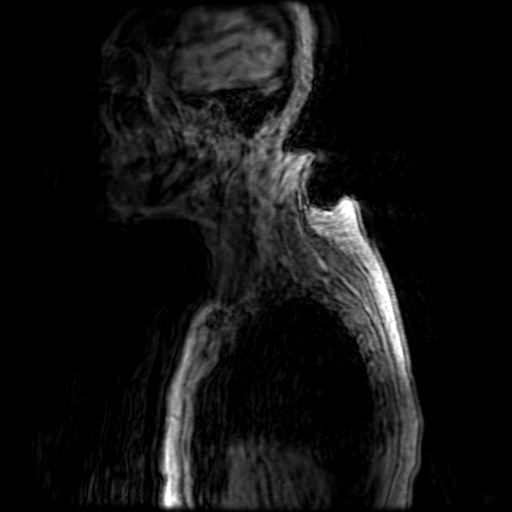
[im 545/576]
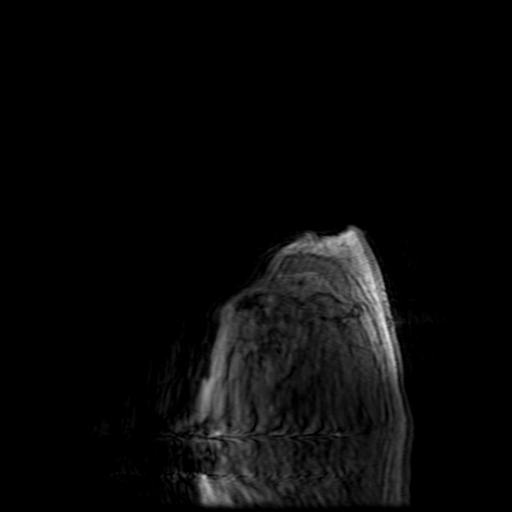

[Series 5: sag 2d pc · sagittal · 60.0mm · 1.37mm/px · 1 of 4 slices shown]
[im 1/4]
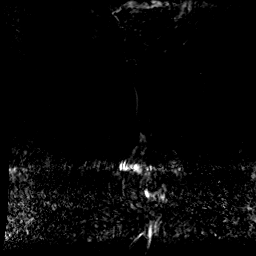

[Series 300: col:ax (id) fspgr · axial · 2.2mm · 0.94mm/px · 1 of 1 slices shown]
[im 1/1]
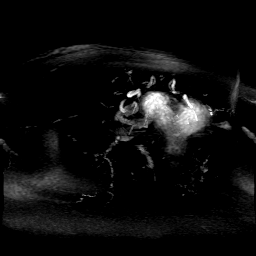

[Series 301: pjn:ax (id) fspgr · sagittal · 2.2mm · 0.94mm/px · 1 of 19 slices shown]
[im 1/19]
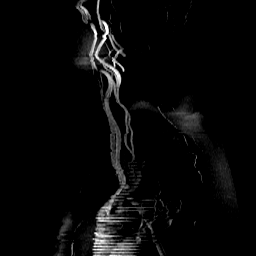

[Series 401: pjn:opt sag inhance · coronal · 1.6mm · 0.66mm/px · 1 of 19 slices shown]
[im 1/19]
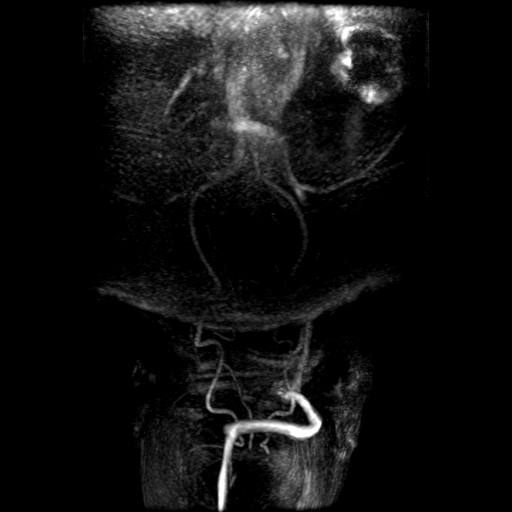

[Series 600: cor cemra ft · coronal · 1.4mm · 0.59mm/px · 3 of 100 slices shown]
[im 1/100]
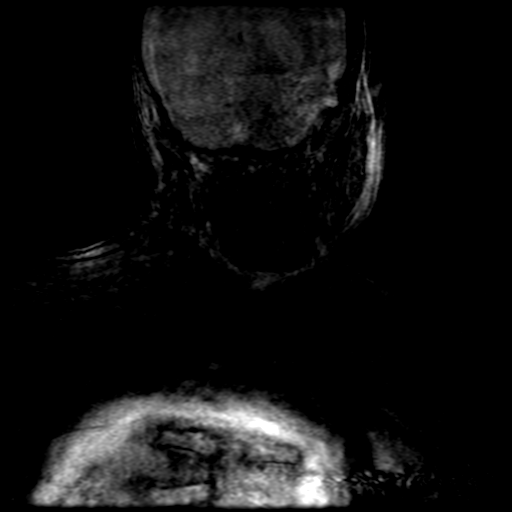
[im 50/100]
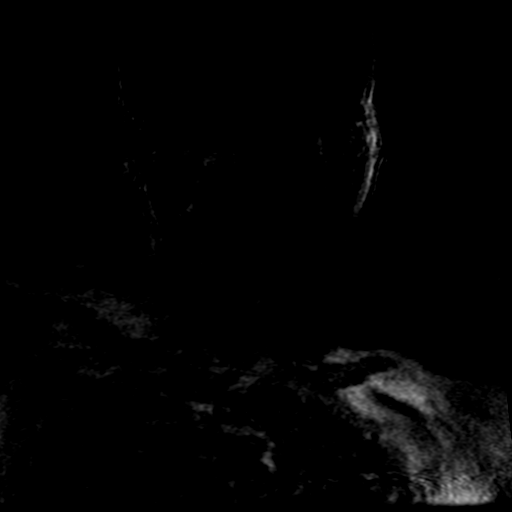
[im 100/100]
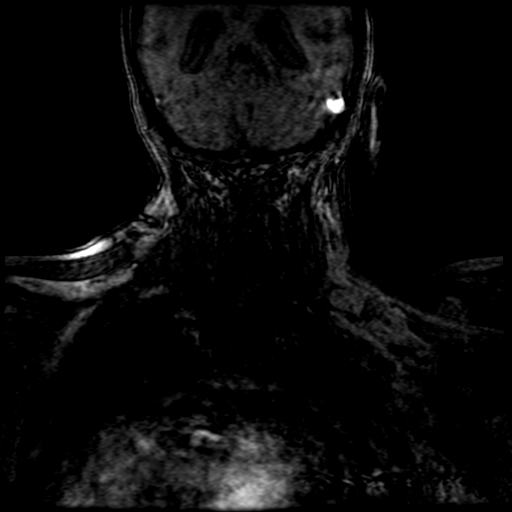

[Series 601: ph1/cor cemra ft · coronal · 1.4mm · 0.59mm/px · 3 of 100 slices shown]
[im 1/100]
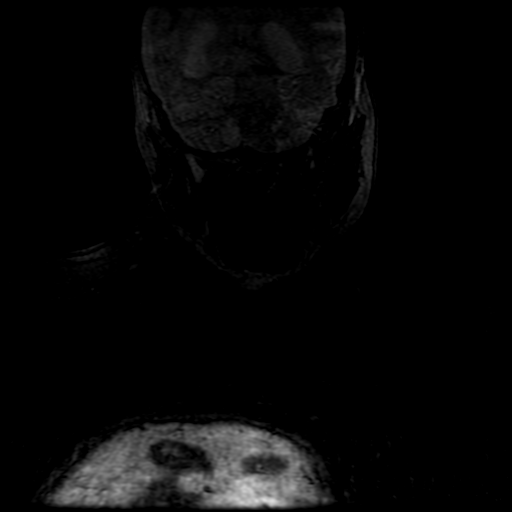
[im 50/100]
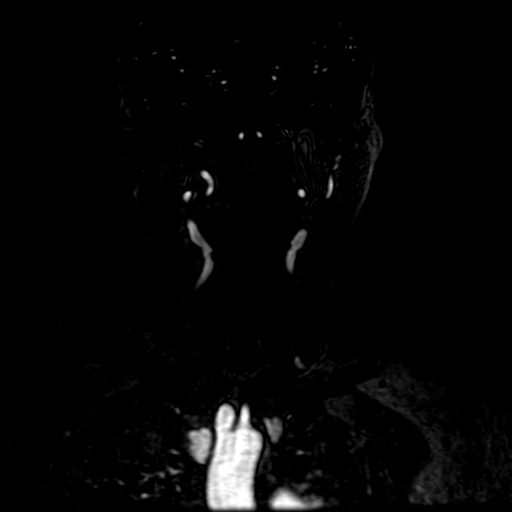
[im 100/100]
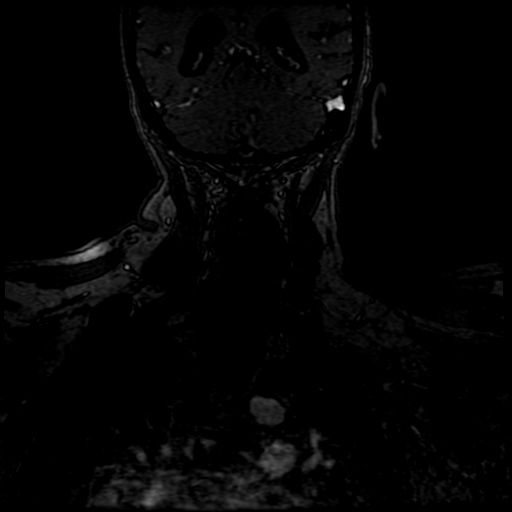

[25 of 48 positions shown; findings below may reference images not displayed]

FINDINGS: Precontrast time-of-flight imaging reveals antegrade flow in the
bilateral carotid and vertebral arteries throughout the neck and to
the skullbase.

Post-contrast neck MRA images reveal a 3 vessel arch configuration.
No great vessel origin stenosis.

Mildly tortuous right CCA. Widely patent right carotid bifurcation.
Negative cervical right ICA aside from mild to moderate tortuosity.

Mild irregularity in the proximal left CCA which does not appear
hemodynamically significant. Mildly tortuous left CCA. Widely patent
left carotid bifurcation. Negative cervical left ICA aside from mild
tortuosity.

No proximal right subclavian artery stenosis. Moderate to severe
stenosis at the right vertebral artery origin (series 603, image 2).
Mild right vertebral artery tortuosity with no right vertebral
artery stenosis. Patent vertebrobasilar junction and basilar artery.
Patent right PICA origin.

Normal left vertebral artery origin. Tortuous left V1 segment. No
left vertebral artery stenosis. Patent left PICA origin. Patent left
vertebrobasilar junction.
IMPRESSION: 1. Moderate to severe right vertebral artery origin stenosis.
Otherwise negative vertebral arteries and visualized posterior
circulation.
2. No cervical carotid artery stenosis.
